# Patient Record
Sex: Female | Born: 1950
Health system: Southern US, Community
[De-identification: ages and names within clinical notes are randomized; demographics above are authoritative.]

## PROBLEM LIST (undated history)

## (undated) DIAGNOSIS — S83207A Unspecified tear of unspecified meniscus, current injury, left knee, initial encounter: Secondary | ICD-10-CM

## (undated) DIAGNOSIS — H43393 Other vitreous opacities, bilateral: Secondary | ICD-10-CM

## (undated) DIAGNOSIS — K449 Diaphragmatic hernia without obstruction or gangrene: Secondary | ICD-10-CM

## (undated) DIAGNOSIS — Z8719 Personal history of other diseases of the digestive system: Secondary | ICD-10-CM

## (undated) DIAGNOSIS — R7303 Prediabetes: Secondary | ICD-10-CM

## (undated) DIAGNOSIS — E559 Vitamin D deficiency, unspecified: Secondary | ICD-10-CM

## (undated) DIAGNOSIS — I10 Essential (primary) hypertension: Secondary | ICD-10-CM

## (undated) DIAGNOSIS — J302 Other seasonal allergic rhinitis: Secondary | ICD-10-CM

## (undated) DIAGNOSIS — K8010 Calculus of gallbladder with chronic cholecystitis without obstruction: Secondary | ICD-10-CM

## (undated) DIAGNOSIS — M1990 Unspecified osteoarthritis, unspecified site: Secondary | ICD-10-CM

## (undated) DIAGNOSIS — R0789 Other chest pain: Secondary | ICD-10-CM

## (undated) DIAGNOSIS — K740 Hepatic fibrosis: Secondary | ICD-10-CM

## (undated) DIAGNOSIS — I499 Cardiac arrhythmia, unspecified: Secondary | ICD-10-CM

## (undated) DIAGNOSIS — R0600 Dyspnea, unspecified: Secondary | ICD-10-CM

## (undated) DIAGNOSIS — Z8619 Personal history of other infectious and parasitic diseases: Secondary | ICD-10-CM

## (undated) DIAGNOSIS — K648 Other hemorrhoids: Secondary | ICD-10-CM

## (undated) DIAGNOSIS — Z8489 Family history of other specified conditions: Secondary | ICD-10-CM

## (undated) DIAGNOSIS — I422 Other hypertrophic cardiomyopathy: Secondary | ICD-10-CM

## (undated) DIAGNOSIS — Z9889 Other specified postprocedural states: Secondary | ICD-10-CM

## (undated) DIAGNOSIS — Z86018 Personal history of other benign neoplasm: Secondary | ICD-10-CM

## (undated) DIAGNOSIS — R0602 Shortness of breath: Secondary | ICD-10-CM

## (undated) HISTORY — DX: Hepatic fibrosis: K74.0

## (undated) HISTORY — PX: DOBUTAMINE STRESS ECHO: SHX5426

## (undated) HISTORY — DX: Other chest pain: R07.89

## (undated) HISTORY — PX: TRANSTHORACIC ECHOCARDIOGRAM: SHX275

## (undated) HISTORY — PX: COLONOSCOPY WITH ESOPHAGOGASTRODUODENOSCOPY (EGD): SHX5779

## (undated) HISTORY — PX: APPENDECTOMY: SHX54

## (undated) HISTORY — DX: Other hemorrhoids: K64.8

## (undated) HISTORY — DX: Essential (primary) hypertension: I10

## (undated) HISTORY — DX: Diaphragmatic hernia without obstruction or gangrene: K44.9

## (undated) HISTORY — DX: Calculus of gallbladder with chronic cholecystitis without obstruction: K80.10

## (undated) HISTORY — DX: Dyspnea, unspecified: R06.00

## (undated) HISTORY — DX: Vitamin D deficiency, unspecified: E55.9

---
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
## 1992-07-18 HISTORY — PX: ABDOMINAL HYSTERECTOMY: SHX81

## 1999-12-16 ENCOUNTER — Encounter: Payer: Self-pay | Admitting: Obstetrics and Gynecology

## 1999-12-16 ENCOUNTER — Encounter: Admission: RE | Admit: 1999-12-16 | Discharge: 1999-12-16 | Payer: Self-pay | Admitting: Obstetrics and Gynecology

## 2000-09-18 ENCOUNTER — Encounter: Admission: RE | Admit: 2000-09-18 | Discharge: 2000-09-28 | Payer: Self-pay | Admitting: Family Medicine

## 2001-01-08 ENCOUNTER — Encounter: Payer: Self-pay | Admitting: Obstetrics and Gynecology

## 2001-01-08 ENCOUNTER — Encounter: Admission: RE | Admit: 2001-01-08 | Discharge: 2001-01-08 | Payer: Self-pay | Admitting: Obstetrics and Gynecology

## 2001-11-20 ENCOUNTER — Encounter: Payer: Self-pay | Admitting: Internal Medicine

## 2001-11-20 ENCOUNTER — Observation Stay (HOSPITAL_COMMUNITY): Admission: RE | Admit: 2001-11-20 | Discharge: 2001-11-21 | Payer: Self-pay | Admitting: Internal Medicine

## 2001-11-21 ENCOUNTER — Encounter: Payer: Self-pay | Admitting: Internal Medicine

## 2002-02-15 ENCOUNTER — Encounter: Payer: Self-pay | Admitting: Obstetrics and Gynecology

## 2002-02-15 ENCOUNTER — Encounter: Admission: RE | Admit: 2002-02-15 | Discharge: 2002-02-15 | Payer: Self-pay | Admitting: Obstetrics and Gynecology

## 2002-02-22 ENCOUNTER — Ambulatory Visit (HOSPITAL_COMMUNITY): Admission: RE | Admit: 2002-02-22 | Discharge: 2002-02-22 | Payer: Self-pay | Admitting: *Deleted

## 2002-02-22 ENCOUNTER — Encounter: Payer: Self-pay | Admitting: Internal Medicine

## 2003-03-19 ENCOUNTER — Encounter: Payer: Self-pay | Admitting: Obstetrics and Gynecology

## 2003-03-19 ENCOUNTER — Encounter: Admission: RE | Admit: 2003-03-19 | Discharge: 2003-03-19 | Payer: Self-pay | Admitting: Obstetrics and Gynecology

## 2004-06-04 ENCOUNTER — Ambulatory Visit (HOSPITAL_COMMUNITY): Admission: RE | Admit: 2004-06-04 | Discharge: 2004-06-04 | Payer: Self-pay | Admitting: Obstetrics and Gynecology

## 2004-07-13 ENCOUNTER — Ambulatory Visit: Payer: Self-pay | Admitting: Internal Medicine

## 2004-08-30 ENCOUNTER — Ambulatory Visit: Payer: Self-pay | Admitting: Internal Medicine

## 2004-08-30 ENCOUNTER — Encounter: Admission: RE | Admit: 2004-08-30 | Discharge: 2004-08-30 | Payer: Self-pay | Admitting: Internal Medicine

## 2004-10-18 ENCOUNTER — Encounter: Admission: RE | Admit: 2004-10-18 | Discharge: 2004-11-04 | Payer: Self-pay | Admitting: Orthopedic Surgery

## 2005-04-25 ENCOUNTER — Ambulatory Visit: Payer: Self-pay | Admitting: Internal Medicine

## 2005-04-29 ENCOUNTER — Ambulatory Visit: Payer: Self-pay | Admitting: Internal Medicine

## 2005-07-12 ENCOUNTER — Ambulatory Visit: Payer: Self-pay | Admitting: Internal Medicine

## 2005-07-15 ENCOUNTER — Ambulatory Visit (HOSPITAL_COMMUNITY): Admission: RE | Admit: 2005-07-15 | Discharge: 2005-07-15 | Payer: Self-pay | Admitting: Obstetrics and Gynecology

## 2005-08-02 ENCOUNTER — Ambulatory Visit: Payer: Self-pay | Admitting: Internal Medicine

## 2005-08-02 LAB — HM COLONOSCOPY

## 2005-08-02 LAB — HM MAMMOGRAPHY

## 2006-02-09 ENCOUNTER — Ambulatory Visit: Payer: Self-pay | Admitting: Internal Medicine

## 2006-02-10 ENCOUNTER — Ambulatory Visit: Payer: Self-pay | Admitting: Cardiology

## 2006-03-09 ENCOUNTER — Ambulatory Visit: Payer: Self-pay | Admitting: Internal Medicine

## 2006-03-17 ENCOUNTER — Ambulatory Visit: Payer: Self-pay

## 2006-03-17 ENCOUNTER — Encounter: Payer: Self-pay | Admitting: Internal Medicine

## 2006-03-17 HISTORY — PX: CARDIOVASCULAR STRESS TEST: SHX262

## 2006-05-09 ENCOUNTER — Ambulatory Visit: Payer: Self-pay | Admitting: Internal Medicine

## 2006-05-09 LAB — CONVERTED CEMR LAB
ALT: 38 units/L (ref 0–40)
AST: 37 units/L (ref 0–37)
BUN: 8 mg/dL (ref 6–23)
Basophils Absolute: 0 10*3/uL (ref 0.0–0.1)
Basophils Relative: 0 % (ref 0.0–1.0)
Creatinine, Ser: 0.8 mg/dL (ref 0.4–1.2)
Eosinophil percent: 2.6 % (ref 0.0–5.0)
HCT: 43.8 % (ref 36.0–46.0)
Hemoglobin: 14.7 g/dL (ref 12.0–15.0)
Lymphocytes Relative: 53.7 % — ABNORMAL HIGH (ref 12.0–46.0)
MCHC: 33.5 g/dL (ref 30.0–36.0)
MCV: 93 fL (ref 78.0–100.0)
Monocytes Absolute: 0.5 10*3/uL (ref 0.2–0.7)
Monocytes Relative: 8.4 % (ref 3.0–11.0)
Neutro Abs: 2 10*3/uL (ref 1.4–7.7)
Neutrophils Relative %: 35.3 % — ABNORMAL LOW (ref 43.0–77.0)
Platelets: 224 10*3/uL (ref 150–400)
Potassium: 3.6 meq/L (ref 3.5–5.1)
RBC: 4.71 M/uL (ref 3.87–5.11)
RDW: 12.5 % (ref 11.5–14.6)
WBC: 5.8 10*3/uL (ref 4.5–10.5)

## 2006-06-05 ENCOUNTER — Ambulatory Visit: Payer: Self-pay | Admitting: Pulmonary Disease

## 2006-06-27 ENCOUNTER — Ambulatory Visit: Payer: Self-pay | Admitting: Pulmonary Disease

## 2006-07-17 ENCOUNTER — Ambulatory Visit (HOSPITAL_COMMUNITY): Admission: RE | Admit: 2006-07-17 | Discharge: 2006-07-17 | Payer: Self-pay | Admitting: Obstetrics and Gynecology

## 2006-07-21 ENCOUNTER — Ambulatory Visit (HOSPITAL_COMMUNITY): Admission: RE | Admit: 2006-07-21 | Discharge: 2006-07-21 | Payer: Self-pay | Admitting: Pulmonary Disease

## 2006-08-03 ENCOUNTER — Ambulatory Visit: Payer: Self-pay | Admitting: Pulmonary Disease

## 2006-08-16 DIAGNOSIS — R946 Abnormal results of thyroid function studies: Secondary | ICD-10-CM

## 2006-12-07 ENCOUNTER — Ambulatory Visit: Payer: Self-pay | Admitting: Internal Medicine

## 2007-01-29 ENCOUNTER — Encounter: Payer: Self-pay | Admitting: Internal Medicine

## 2007-02-15 ENCOUNTER — Encounter: Payer: Self-pay | Admitting: Internal Medicine

## 2007-02-22 ENCOUNTER — Encounter: Payer: Self-pay | Admitting: Internal Medicine

## 2007-03-10 ENCOUNTER — Encounter: Payer: Self-pay | Admitting: Internal Medicine

## 2007-03-13 ENCOUNTER — Ambulatory Visit: Payer: Self-pay | Admitting: Internal Medicine

## 2007-03-13 DIAGNOSIS — R9431 Abnormal electrocardiogram [ECG] [EKG]: Secondary | ICD-10-CM | POA: Insufficient documentation

## 2007-04-05 ENCOUNTER — Ambulatory Visit: Payer: Self-pay | Admitting: Internal Medicine

## 2007-04-05 DIAGNOSIS — I10 Essential (primary) hypertension: Secondary | ICD-10-CM

## 2007-04-05 HISTORY — DX: Essential (primary) hypertension: I10

## 2007-05-21 ENCOUNTER — Encounter: Payer: Self-pay | Admitting: Internal Medicine

## 2007-08-16 ENCOUNTER — Ambulatory Visit (HOSPITAL_COMMUNITY): Admission: RE | Admit: 2007-08-16 | Discharge: 2007-08-16 | Payer: Self-pay | Admitting: Obstetrics and Gynecology

## 2007-11-06 ENCOUNTER — Ambulatory Visit: Payer: Self-pay | Admitting: Internal Medicine

## 2007-11-06 DIAGNOSIS — R091 Pleurisy: Secondary | ICD-10-CM | POA: Insufficient documentation

## 2007-11-07 ENCOUNTER — Ambulatory Visit: Payer: Self-pay | Admitting: Internal Medicine

## 2007-11-07 ENCOUNTER — Telehealth: Payer: Self-pay | Admitting: Family Medicine

## 2007-11-07 ENCOUNTER — Telehealth (INDEPENDENT_AMBULATORY_CARE_PROVIDER_SITE_OTHER): Payer: Self-pay | Admitting: *Deleted

## 2007-11-08 ENCOUNTER — Telehealth (INDEPENDENT_AMBULATORY_CARE_PROVIDER_SITE_OTHER): Payer: Self-pay | Admitting: *Deleted

## 2007-11-20 ENCOUNTER — Ambulatory Visit: Payer: Self-pay | Admitting: Internal Medicine

## 2007-11-22 ENCOUNTER — Telehealth (INDEPENDENT_AMBULATORY_CARE_PROVIDER_SITE_OTHER): Payer: Self-pay | Admitting: *Deleted

## 2007-12-03 ENCOUNTER — Telehealth (INDEPENDENT_AMBULATORY_CARE_PROVIDER_SITE_OTHER): Payer: Self-pay | Admitting: *Deleted

## 2007-12-04 ENCOUNTER — Ambulatory Visit: Payer: Self-pay | Admitting: Internal Medicine

## 2007-12-04 DIAGNOSIS — R079 Chest pain, unspecified: Secondary | ICD-10-CM

## 2007-12-07 ENCOUNTER — Telehealth: Payer: Self-pay | Admitting: Internal Medicine

## 2007-12-12 ENCOUNTER — Telehealth: Payer: Self-pay | Admitting: Internal Medicine

## 2007-12-12 ENCOUNTER — Encounter: Payer: Self-pay | Admitting: Internal Medicine

## 2007-12-13 ENCOUNTER — Ambulatory Visit: Payer: Self-pay | Admitting: Internal Medicine

## 2007-12-13 DIAGNOSIS — R05 Cough: Secondary | ICD-10-CM

## 2007-12-13 DIAGNOSIS — R071 Chest pain on breathing: Secondary | ICD-10-CM | POA: Insufficient documentation

## 2007-12-18 ENCOUNTER — Ambulatory Visit: Payer: Self-pay | Admitting: Cardiology

## 2007-12-18 ENCOUNTER — Telehealth: Payer: Self-pay | Admitting: Internal Medicine

## 2008-01-21 ENCOUNTER — Telehealth (INDEPENDENT_AMBULATORY_CARE_PROVIDER_SITE_OTHER): Payer: Self-pay | Admitting: *Deleted

## 2008-01-28 ENCOUNTER — Ambulatory Visit: Payer: Self-pay | Admitting: Internal Medicine

## 2008-01-28 DIAGNOSIS — J9819 Other pulmonary collapse: Secondary | ICD-10-CM | POA: Insufficient documentation

## 2008-04-26 ENCOUNTER — Encounter: Payer: Self-pay | Admitting: Internal Medicine

## 2008-05-08 ENCOUNTER — Encounter: Payer: Self-pay | Admitting: Internal Medicine

## 2008-05-12 ENCOUNTER — Ambulatory Visit: Payer: Self-pay | Admitting: Internal Medicine

## 2008-05-12 DIAGNOSIS — R1013 Epigastric pain: Secondary | ICD-10-CM

## 2008-05-12 LAB — CONVERTED CEMR LAB
Bilirubin Urine: NEGATIVE
Blood in Urine, dipstick: NEGATIVE
Glucose, Urine, Semiquant: NEGATIVE
Ketones, urine, test strip: NEGATIVE
Nitrite: NEGATIVE
Protein, U semiquant: NEGATIVE
Specific Gravity, Urine: <1.005
Urobilinogen, UA: 0.2
WBC Urine, dipstick: NEGATIVE
pH: 8

## 2008-05-13 ENCOUNTER — Encounter: Payer: Self-pay | Admitting: Internal Medicine

## 2008-05-13 DIAGNOSIS — R748 Abnormal levels of other serum enzymes: Secondary | ICD-10-CM | POA: Insufficient documentation

## 2008-05-14 ENCOUNTER — Encounter (INDEPENDENT_AMBULATORY_CARE_PROVIDER_SITE_OTHER): Payer: Self-pay | Admitting: *Deleted

## 2008-05-14 LAB — CONVERTED CEMR LAB
ALT: 31 units/L (ref 0–35)
AST: 30 units/L (ref 0–37)
Amylase: 143 units/L — ABNORMAL HIGH (ref 27–131)
HCT: 42.1 % (ref 36.0–46.0)
Hemoglobin: 14.4 g/dL (ref 12.0–15.0)
Lipase: 31 units/L (ref 11.0–59.0)
MCHC: 34.3 g/dL (ref 30.0–36.0)
MCV: 93.9 fL (ref 78.0–100.0)
Platelets: 172 10*3/uL (ref 150–400)
RBC: 4.48 M/uL (ref 3.87–5.11)
RDW: 12.8 % (ref 11.5–14.6)
WBC: 7.3 10*3/uL (ref 4.5–10.5)

## 2008-05-21 ENCOUNTER — Encounter: Admission: RE | Admit: 2008-05-21 | Discharge: 2008-05-21 | Payer: Self-pay | Admitting: Internal Medicine

## 2008-05-22 ENCOUNTER — Telehealth (INDEPENDENT_AMBULATORY_CARE_PROVIDER_SITE_OTHER): Payer: Self-pay | Admitting: *Deleted

## 2008-06-26 ENCOUNTER — Telehealth (INDEPENDENT_AMBULATORY_CARE_PROVIDER_SITE_OTHER): Payer: Self-pay | Admitting: *Deleted

## 2008-06-30 ENCOUNTER — Encounter (INDEPENDENT_AMBULATORY_CARE_PROVIDER_SITE_OTHER): Payer: Self-pay | Admitting: *Deleted

## 2008-07-03 ENCOUNTER — Telehealth (INDEPENDENT_AMBULATORY_CARE_PROVIDER_SITE_OTHER): Payer: Self-pay | Admitting: *Deleted

## 2008-07-15 ENCOUNTER — Ambulatory Visit: Payer: Self-pay | Admitting: Cardiovascular Disease

## 2008-07-29 DIAGNOSIS — I2510 Atherosclerotic heart disease of native coronary artery without angina pectoris: Secondary | ICD-10-CM | POA: Insufficient documentation

## 2008-07-29 DIAGNOSIS — K649 Unspecified hemorrhoids: Secondary | ICD-10-CM | POA: Insufficient documentation

## 2008-07-30 ENCOUNTER — Ambulatory Visit: Payer: Self-pay | Admitting: Internal Medicine

## 2008-07-30 DIAGNOSIS — R932 Abnormal findings on diagnostic imaging of liver and biliary tract: Secondary | ICD-10-CM | POA: Insufficient documentation

## 2008-08-04 ENCOUNTER — Ambulatory Visit: Payer: Self-pay | Admitting: Internal Medicine

## 2008-08-05 DIAGNOSIS — K8020 Calculus of gallbladder without cholecystitis without obstruction: Secondary | ICD-10-CM | POA: Insufficient documentation

## 2008-08-05 DIAGNOSIS — R143 Flatulence: Secondary | ICD-10-CM

## 2008-08-05 DIAGNOSIS — R141 Gas pain: Secondary | ICD-10-CM

## 2008-08-05 DIAGNOSIS — R142 Eructation: Secondary | ICD-10-CM

## 2008-09-25 ENCOUNTER — Encounter (INDEPENDENT_AMBULATORY_CARE_PROVIDER_SITE_OTHER): Payer: Self-pay | Admitting: *Deleted

## 2008-09-25 ENCOUNTER — Ambulatory Visit: Payer: Self-pay | Admitting: Internal Medicine

## 2008-09-25 DIAGNOSIS — J309 Allergic rhinitis, unspecified: Secondary | ICD-10-CM | POA: Insufficient documentation

## 2008-09-26 ENCOUNTER — Telehealth (INDEPENDENT_AMBULATORY_CARE_PROVIDER_SITE_OTHER): Payer: Self-pay | Admitting: *Deleted

## 2008-09-29 ENCOUNTER — Ambulatory Visit: Payer: Self-pay | Admitting: Internal Medicine

## 2008-09-29 DIAGNOSIS — K219 Gastro-esophageal reflux disease without esophagitis: Secondary | ICD-10-CM | POA: Insufficient documentation

## 2008-10-16 ENCOUNTER — Ambulatory Visit (HOSPITAL_COMMUNITY): Admission: RE | Admit: 2008-10-16 | Discharge: 2008-10-16 | Payer: Self-pay | Admitting: Internal Medicine

## 2008-10-23 ENCOUNTER — Telehealth (INDEPENDENT_AMBULATORY_CARE_PROVIDER_SITE_OTHER): Payer: Self-pay | Admitting: *Deleted

## 2008-11-14 ENCOUNTER — Ambulatory Visit (HOSPITAL_COMMUNITY): Admission: RE | Admit: 2008-11-14 | Discharge: 2008-11-14 | Payer: Self-pay | Admitting: Obstetrics and Gynecology

## 2008-12-01 ENCOUNTER — Encounter: Payer: Self-pay | Admitting: Internal Medicine

## 2008-12-08 ENCOUNTER — Ambulatory Visit: Payer: Self-pay | Admitting: Internal Medicine

## 2008-12-08 LAB — CONVERTED CEMR LAB
BUN: 7 mg/dL (ref 6–23)
Creatinine, Ser: 0.7 mg/dL (ref 0.4–1.2)

## 2008-12-12 ENCOUNTER — Ambulatory Visit: Payer: Self-pay | Admitting: Internal Medicine

## 2008-12-29 ENCOUNTER — Ambulatory Visit: Payer: Self-pay | Admitting: Family Medicine

## 2008-12-29 DIAGNOSIS — R21 Rash and other nonspecific skin eruption: Secondary | ICD-10-CM

## 2009-01-01 ENCOUNTER — Telehealth (INDEPENDENT_AMBULATORY_CARE_PROVIDER_SITE_OTHER): Payer: Self-pay | Admitting: *Deleted

## 2009-02-26 ENCOUNTER — Ambulatory Visit: Payer: Self-pay | Admitting: Internal Medicine

## 2009-02-26 DIAGNOSIS — M25519 Pain in unspecified shoulder: Secondary | ICD-10-CM

## 2009-02-26 DIAGNOSIS — K921 Melena: Secondary | ICD-10-CM | POA: Insufficient documentation

## 2009-04-27 ENCOUNTER — Telehealth: Payer: Self-pay | Admitting: Internal Medicine

## 2009-05-04 ENCOUNTER — Encounter: Payer: Self-pay | Admitting: Internal Medicine

## 2009-05-11 ENCOUNTER — Telehealth: Payer: Self-pay | Admitting: Internal Medicine

## 2009-05-12 ENCOUNTER — Encounter (INDEPENDENT_AMBULATORY_CARE_PROVIDER_SITE_OTHER): Payer: Self-pay | Admitting: *Deleted

## 2009-06-15 ENCOUNTER — Ambulatory Visit: Payer: Self-pay | Admitting: Internal Medicine

## 2009-06-25 ENCOUNTER — Telehealth: Payer: Self-pay | Admitting: Internal Medicine

## 2009-08-06 ENCOUNTER — Telehealth: Payer: Self-pay | Admitting: Internal Medicine

## 2009-08-25 ENCOUNTER — Ambulatory Visit: Payer: Self-pay | Admitting: Internal Medicine

## 2009-08-25 DIAGNOSIS — R062 Wheezing: Secondary | ICD-10-CM

## 2009-09-22 ENCOUNTER — Ambulatory Visit: Payer: Self-pay | Admitting: Diagnostic Radiology

## 2009-09-22 ENCOUNTER — Ambulatory Visit (HOSPITAL_BASED_OUTPATIENT_CLINIC_OR_DEPARTMENT_OTHER): Admission: RE | Admit: 2009-09-22 | Discharge: 2009-09-22 | Payer: Self-pay | Admitting: Family Medicine

## 2009-09-22 ENCOUNTER — Ambulatory Visit: Payer: Self-pay | Admitting: Family Medicine

## 2009-09-22 DIAGNOSIS — Z87448 Personal history of other diseases of urinary system: Secondary | ICD-10-CM

## 2009-09-28 ENCOUNTER — Encounter: Payer: Self-pay | Admitting: Internal Medicine

## 2009-11-25 ENCOUNTER — Ambulatory Visit: Payer: Self-pay | Admitting: Family Medicine

## 2009-11-25 ENCOUNTER — Telehealth: Payer: Self-pay | Admitting: Family Medicine

## 2009-11-25 ENCOUNTER — Encounter: Payer: Self-pay | Admitting: Internal Medicine

## 2009-11-25 DIAGNOSIS — R0602 Shortness of breath: Secondary | ICD-10-CM

## 2009-11-26 ENCOUNTER — Encounter: Payer: Self-pay | Admitting: Family Medicine

## 2009-12-02 ENCOUNTER — Telehealth: Payer: Self-pay | Admitting: Family Medicine

## 2009-12-07 ENCOUNTER — Encounter: Payer: Self-pay | Admitting: Family Medicine

## 2009-12-10 ENCOUNTER — Encounter (HOSPITAL_COMMUNITY): Admission: RE | Admit: 2009-12-10 | Discharge: 2010-03-10 | Payer: Self-pay | Admitting: Family Medicine

## 2010-01-22 ENCOUNTER — Ambulatory Visit (HOSPITAL_COMMUNITY): Admission: RE | Admit: 2010-01-22 | Discharge: 2010-01-22 | Payer: Self-pay | Admitting: Obstetrics and Gynecology

## 2010-02-05 ENCOUNTER — Encounter: Payer: Self-pay | Admitting: Internal Medicine

## 2010-02-05 ENCOUNTER — Ambulatory Visit: Payer: Self-pay | Admitting: Internal Medicine

## 2010-02-05 DIAGNOSIS — K222 Esophageal obstruction: Secondary | ICD-10-CM

## 2010-02-05 DIAGNOSIS — Z8719 Personal history of other diseases of the digestive system: Secondary | ICD-10-CM | POA: Insufficient documentation

## 2010-02-05 DIAGNOSIS — R1012 Left upper quadrant pain: Secondary | ICD-10-CM | POA: Insufficient documentation

## 2010-02-08 ENCOUNTER — Telehealth (INDEPENDENT_AMBULATORY_CARE_PROVIDER_SITE_OTHER): Payer: Self-pay | Admitting: *Deleted

## 2010-02-08 ENCOUNTER — Encounter (INDEPENDENT_AMBULATORY_CARE_PROVIDER_SITE_OTHER): Payer: Self-pay | Admitting: *Deleted

## 2010-02-09 LAB — CONVERTED CEMR LAB
AST: 41 units/L — ABNORMAL HIGH (ref 0–37)
Albumin: 4 g/dL (ref 3.5–5.2)
Alkaline Phosphatase: 48 units/L (ref 39–117)
Amylase: 106 units/L (ref 27–131)
Basophils Absolute: 0 10*3/uL (ref 0.0–0.1)
Basophils Relative: 0.8 % (ref 0.0–3.0)
Bilirubin, Direct: 0.2 mg/dL (ref 0.0–0.3)
Eosinophils Absolute: 0.2 10*3/uL (ref 0.0–0.7)
Eosinophils Relative: 3.4 % (ref 0.0–5.0)
HCT: 42.7 % (ref 36.0–46.0)
Hemoglobin: 14.6 g/dL (ref 12.0–15.0)
Lipase: 37 units/L (ref 11.0–59.0)
Lymphocytes Relative: 42.4 % (ref 12.0–46.0)
Lymphs Abs: 2.6 10*3/uL (ref 0.7–4.0)
MCHC: 34.3 g/dL (ref 30.0–36.0)
MCV: 92.6 fL (ref 78.0–100.0)
Monocytes Absolute: 0.4 10*3/uL (ref 0.1–1.0)
Monocytes Relative: 6.6 % (ref 3.0–12.0)
Neutro Abs: 2.8 10*3/uL (ref 1.4–7.7)
Neutrophils Relative %: 46.8 % (ref 43.0–77.0)
Platelets: 200 10*3/uL (ref 150.0–400.0)
RBC: 4.61 M/uL (ref 3.87–5.11)
RDW: 13.3 % (ref 11.5–14.6)
Total Protein: 6.9 g/dL (ref 6.0–8.3)

## 2010-02-10 ENCOUNTER — Encounter: Payer: Self-pay | Admitting: Cardiology

## 2010-02-10 ENCOUNTER — Ambulatory Visit: Payer: Self-pay | Admitting: Cardiology

## 2010-03-05 ENCOUNTER — Ambulatory Visit: Payer: Self-pay

## 2010-03-05 ENCOUNTER — Encounter: Payer: Self-pay | Admitting: Cardiology

## 2010-03-05 ENCOUNTER — Ambulatory Visit: Payer: Self-pay | Admitting: Cardiology

## 2010-03-05 ENCOUNTER — Ambulatory Visit (HOSPITAL_COMMUNITY): Admission: RE | Admit: 2010-03-05 | Discharge: 2010-03-05 | Payer: Self-pay | Admitting: Cardiology

## 2010-03-09 ENCOUNTER — Telehealth: Payer: Self-pay | Admitting: Internal Medicine

## 2010-03-10 ENCOUNTER — Telehealth: Payer: Self-pay | Admitting: Internal Medicine

## 2010-03-19 ENCOUNTER — Ambulatory Visit: Payer: Self-pay | Admitting: Internal Medicine

## 2010-03-26 LAB — CONVERTED CEMR LAB: AST: 43 units/L — ABNORMAL HIGH (ref 0–37)

## 2010-04-16 ENCOUNTER — Ambulatory Visit: Payer: Self-pay | Admitting: Internal Medicine

## 2010-04-16 DIAGNOSIS — R740 Nonspecific elevation of levels of transaminase and lactic acid dehydrogenase [LDH]: Secondary | ICD-10-CM

## 2010-04-16 DIAGNOSIS — K140 Glossitis: Secondary | ICD-10-CM

## 2010-07-13 ENCOUNTER — Ambulatory Visit: Payer: Self-pay | Admitting: Internal Medicine

## 2010-07-13 DIAGNOSIS — R1011 Right upper quadrant pain: Secondary | ICD-10-CM | POA: Insufficient documentation

## 2010-07-13 DIAGNOSIS — K449 Diaphragmatic hernia without obstruction or gangrene: Secondary | ICD-10-CM | POA: Insufficient documentation

## 2010-08-06 ENCOUNTER — Other Ambulatory Visit: Payer: Self-pay | Admitting: Internal Medicine

## 2010-08-06 ENCOUNTER — Ambulatory Visit
Admission: RE | Admit: 2010-08-06 | Discharge: 2010-08-06 | Payer: Self-pay | Source: Home / Self Care | Attending: Internal Medicine | Admitting: Internal Medicine

## 2010-08-06 LAB — ALT: ALT: 33 U/L (ref 0–35)

## 2010-08-06 LAB — AST: AST: 32 U/L (ref 0–37)

## 2010-08-08 ENCOUNTER — Encounter: Payer: Self-pay | Admitting: Internal Medicine

## 2010-08-10 ENCOUNTER — Ambulatory Visit
Admission: RE | Admit: 2010-08-10 | Discharge: 2010-08-10 | Payer: Self-pay | Source: Home / Self Care | Attending: Internal Medicine | Admitting: Internal Medicine

## 2010-08-10 ENCOUNTER — Encounter: Payer: Self-pay | Admitting: Internal Medicine

## 2010-08-15 LAB — CONVERTED CEMR LAB
BUN: 6 mg/dL (ref 6–23)
Basophils Absolute: 0 10*3/uL (ref 0.0–0.1)
Basophils Absolute: 0 10*3/uL (ref 0.0–0.1)
Basophils Relative: 0.7 % (ref 0.0–1.0)
Basophils Relative: 0.9 % (ref 0.0–1.0)
CO2: 30 meq/L (ref 19–32)
Calcium: 9.1 mg/dL (ref 8.4–10.5)
Chloride: 103 meq/L (ref 96–112)
Creatinine, Ser: 0.8 mg/dL (ref 0.4–1.2)
Eosinophils Absolute: 0 10*3/uL (ref 0.0–0.7)
Eosinophils Absolute: 0.2 10*3/uL (ref 0.0–0.7)
Eosinophils Relative: 0 % (ref 0.0–5.0)
Eosinophils Relative: 4 % (ref 0.0–5.0)
GFR calc Af Amer: 96 mL/min
GFR calc non Af Amer: 79 mL/min
Glucose, Bld: 86 mg/dL (ref 70–99)
HCT: 43.6 % (ref 36.0–46.0)
HCT: 44.2 % (ref 36.0–46.0)
Hemoglobin: 15 g/dL (ref 12.0–15.0)
Hemoglobin: 15.1 g/dL — ABNORMAL HIGH (ref 12.0–15.0)
Lymphocytes Relative: 45.9 % (ref 12.0–46.0)
Lymphocytes Relative: 87.2 % — ABNORMAL HIGH (ref 12.0–46.0)
MCHC: 34.2 g/dL (ref 30.0–36.0)
MCHC: 34.3 g/dL (ref 30.0–36.0)
MCV: 91.4 fL (ref 78.0–100.0)
MCV: 91.6 fL (ref 78.0–100.0)
Monocytes Absolute: 0.3 10*3/uL (ref 0.1–1.0)
Monocytes Absolute: 0.4 10*3/uL (ref 0.1–1.0)
Monocytes Relative: 11.1 % (ref 3.0–12.0)
Monocytes Relative: 7.5 % (ref 3.0–12.0)
Neutro Abs: 0 10*3/uL — ABNORMAL LOW (ref 1.4–7.7)
Neutro Abs: 2 10*3/uL (ref 1.4–7.7)
Neutrophils Relative %: 1 % — ABNORMAL LOW (ref 43.0–77.0)
Neutrophils Relative %: 41.7 % — ABNORMAL LOW (ref 43.0–77.0)
Platelets: 229 10*3/uL (ref 150–400)
Platelets: 253 10*3/uL (ref 150–400)
Potassium: 3.6 meq/L (ref 3.5–5.1)
RBC: 4.76 M/uL (ref 3.87–5.11)
RBC: 4.84 M/uL (ref 3.87–5.11)
RDW: 12.4 % (ref 11.5–14.6)
RDW: 12.8 % (ref 11.5–14.6)
Sodium: 139 meq/L (ref 135–145)
WBC: 3 10*3/uL — ABNORMAL LOW (ref 4.5–10.5)
WBC: 4.8 10*3/uL (ref 4.5–10.5)

## 2010-08-19 NOTE — Assessment & Plan Note (Signed)
Summary: [facility]      Allergies Added: NKDA  Visit Type:  Initial Consult Referring Provider:  n/a Primary Provider:  Jettie Booze, MD   CC:  chest pain.  History of Present Illness: 60 year old female for evaluation of chest pain. Patient has apparently had occasional chest pain for years. It is in the substernal/epigastric area and does not radiate. It increases with exertion as well as anxiety. It resolved spontaneously after minutes. There is no nausea or diaphoresis but there is shortness of breath. The pain is not pleuritic or positional. She also recently was noted to have significant T-wave inversions on her electrocardiogram. Because of her chest pain and abnormal ECG we were asked to further evaluate. Note she did have a functional study in 2007 that was normal.  Current Medications (verified): 1)  Prilosec Otc 20 Mg Tbec (Omeprazole Magnesium) .... Take 1 By Mouth Once Daily 2)  Align  Caps (Probiotic Product) .Marland Kitchen.. 1 Capsule By Mouth Once Daily 3)  Celebrex 200 Mg Caps (Celecoxib) .Marland Kitchen.. 1 By Mouth Once Daily 4)  Hyoscyamine Sulfate 0.125 Mg Subl (Hyoscyamine Sulfate) .Marland Kitchen.. 1 Sl Every 4 -6 Hrs Prn  Allergies (verified): No Known Drug Allergies  Past History:  Past Medical History: CAD  HYPERTENSION, ESSENTIAL NOS  CHOLELITHIASIS, HX OF  ESOPHAGEAL STRICTURE HEMATURIA, MICROSCOPIC, HX OF  GERD  RHINITIS  CHOLELITHIASIS HEMORRHOIDS Abnormal TFTs 05/2006 - dyspnea;evaluated by Dr. Shelle Iron. Had Normal PFTs with mild reduction in DLCO.  VQ scan  was normal. Placed on expectant followup (ie to be seen prn) No recurrence since then  01/2006 - CT angiogram - negative for PE  Past Surgical History: Reviewed history from 05/12/2008 and no changes required. G2P1 Appendectomy Colonoscopy neg 2006 Hysterectomy 1994 : fibroids, dysfunctional menses  Family History: Reviewed history from 07/30/2008 and no changes required. Mother-living, HTN,  Parkinson'ds Father-deceased prostate CA (diagnosed after age 64) 3 sisters HTN 1 brother HTN, prostate CA (diagnosed about 60 yrs old) Family History of Prostate CA 1st degree relative <50; father  Family History of Arthritis-Mother (severe)  Family History Hypertension: mgm, brothers and sisters No FH GI disease  Social History: Reviewed history from 07/30/2008 and no changes required. 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   Review of Systems       no fevers or chills, productive cough, hemoptysis, dysphasia, odynophagia, melena, hematochezia, dysuria, hematuria, rash, seizure activity, orthopnea, PND, pedal edema, claudication. Remaining systems are negative.   Vital Signs:  Patient profile:   60 year old female Height:      61 inches Weight:      153 pounds BMI:     29.01 Pulse rate:   62 / minute Pulse rhythm:   regular Resp:     18 per minute BP sitting:   140 / 100  (left arm) Cuff size:   large  Vitals Entered By: Vikki Ports (February 10, 2010 10:58 AM)  Physical Exam  General:  Well developed/well nourished in NAD Skin warm/dry Patient not depressed No peripheral clubbing Back-normal HEENT-normal/normal eyelids Neck supple/normal carotid upstroke bilaterally; no bruits; no JVD; no thyromegaly chest - CTA/ normal expansion CV - RRR/normal S1 and S2; no murmurs, rubs or gallops;  PMI nondisplaced Abdomen -NT/ND, no HSM, no mass, + bowel sounds, no bruit 2+ femoral pulses, no bruits Ext-no edema, chords, 2+ DP Neuro-grossly nonfocal     EKG  Procedure date:  02/10/2010  Findings:      Normal sinus rhythm at a rate of 62. Inferior and anterolateral T-wave inversion.  Impression & Recommendations:  Problem # 1:  CHEST PAIN (ICD-786.50) Symptoms with both typical and atypical features. Electrocardiogram abnormal. Proceed with stress  echocardiogram. The following medications were removed from the medication list:    Amlodipine Besylate 5 Mg Tabs (Amlodipine besylate) .Marland Kitchen... 1 qd  Orders: EKG w/ Interpretation (93000) Stress Echo (Stress Echo)  The following medications were removed from the medication list:    Amlodipine Besylate 5 Mg Tabs (Amlodipine besylate) .Marland Kitchen... 1 qd  Problem # 2:  HYPERTENSION, ESSENTIAL NOS (ICD-401.9) Blood pressure elevated. I've asked her to resume her blood pressure medications and she will followup with Dr. Alwyn Ren for this issue. The following medications were removed from the medication list:    Amlodipine Besylate 5 Mg Tabs (Amlodipine besylate) .Marland Kitchen... 1 qd    Avapro 150 Mg Tabs (Irbesartan) .Marland Kitchen... 1 qd  Problem # 3:  ELECTROCARDIOGRAM, ABNORMAL (ICD-794.31)  As per above plan stress echocardiogram. The following medications were removed from the medication list:    Amlodipine Besylate 5 Mg Tabs (Amlodipine besylate) .Marland Kitchen... 1 qd  The following medications were removed from the medication list:    Amlodipine Besylate 5 Mg Tabs (Amlodipine besylate) .Marland Kitchen... 1 qd  Problem # 4:  GERD (ICD-530.81)  Her updated medication list for this problem includes:    Prilosec Otc 20 Mg Tbec (Omeprazole magnesium) .Marland Kitchen... Take 1 by mouth once daily    Hyoscyamine Sulfate 0.125 Mg Subl (Hyoscyamine sulfate) .Marland Kitchen... 1 sl every 4 -6 hrs prn  Her updated medication list for this problem includes:    Prilosec Otc 20 Mg Tbec (Omeprazole magnesium) .Marland Kitchen... Take 1 by mouth once daily    Hyoscyamine Sulfate 0.125 Mg Subl (Hyoscyamine sulfate) .Marland Kitchen... 1 sl every 4 -6 hrs prn  Patient Instructions: 1)  Your physician recommends that you schedule a follow-up appointment in: AS NEEDED 2)  Your physician recommends that you continue on your current medications as directed. Please refer to the Current Medication list given to you today. 3)  Your physician has requested that you have a stress echocardiogram. For further  information please visit [email or web address]  Please follow instruction sheet as given.

## 2010-08-19 NOTE — Assessment & Plan Note (Signed)
Summary: still having pain in side//kn   Vital Signs:  Patient profile:   60 year old female Weight:      154.6 pounds O2 Sat:      99 % Temp:     98.5 degrees F oral Pulse rate:   69 / minute Resp:     17 per minute BP sitting:   128 / 86  (left arm) Cuff size:   large  Vitals Entered By: Shonna Chock CMA (February 05, 2010 10:48 AM) CC: 1.) Ongoing left side pain: pain is worse and constant  2.) Ongoing breathing concerns, Abdominal pain   Primary Care Provider:  Jettie Booze, MD   CC:  1.) Ongoing left side pain: pain is worse and constant  2.) Ongoing breathing concerns and Abdominal pain.  History of Present Illness:  Abdominal Pain      This is a 60 year old woman who presents with Abdominal pain for 6 months, progressive  & now essentially constant .  The patient reports nausea, but denies vomiting, diarrhea, constipation, melena, hematochezia, anorexia, and hematemesis.  The location of the pain is left upper quadrant below the ribs.  The pain is described as constant andthrobbing  in quality.  Associated symptoms include occasional  chest pain.  The patient denies the following symptoms: fever, weight loss, dysuria, jaundice, dark urine, and vaginal bleeding.  The pain has no triggers or relievers.        The patient reports resting chest pain, nausea, and shortness of breath, but denies palpitations, dizziness, light headedness, and syncope.  The pain is described as intermittent and pressure-like.  The pain is located in the substernal area and the pain does not radiate.  Episodes of chest pain last >30 minutes.  The pain is brought on or made worse by meals.  The pain is relieved or improved with  Ginger Ale.  Last episode of pain several days ago. Old chart reviewed : abnormal EKG (diffuse T inversion, stable); gallstones ; esophageal stricture 07/2008.  Current Medications (verified): 1)  Amlodipine Besylate 5 Mg  Tabs (Amlodipine Besylate) .Marland Kitchen.. 1 Qd 2)  Avapro 150 Mg  Tabs  (Irbesartan) .Marland Kitchen.. 1 Qd 3)  Prilosec Otc 20 Mg Tbec (Omeprazole Magnesium) .... Take 1 By Mouth Once Daily 4)  Align  Caps (Probiotic Product) .Marland Kitchen.. 1 Capsule By Mouth Once Daily 5)  Celebrex 200 Mg Caps (Celecoxib) .Marland Kitchen.. 1 By Mouth Once Daily  Allergies (verified): No Known Drug Allergies  Review of Systems General:  Denies chills; Sweats ? menopausal. ENT:  Denies difficulty swallowing and hoarseness. GU:  Denies discharge and hematuria.  Physical Exam  General:  well-nourished,in no acute distress; alert,appropriate and cooperative throughout examination Eyes:  No corneal or conjunctival inflammation noted.Perrla. No icterus Mouth:  Oral mucosa and oropharynx without lesions or exudates.  No pharyngeal erythema.   Lungs:  Normal respiratory effort, chest expands symmetrically. Lungs are clear to auscultation, no crackles or wheezes. Heart:  normal rate, regular rhythm, no gallop, no rub, no JVD, no HJR, and grade  1/2 -1 /6 systolic murmur LSB.   Abdomen:  Bowel sounds positive,abdomen soft and non-tender without masses, organomegaly or hernias noted. Aorta palpable w/o AAA Pulses:  R and L carotid,radial,dorsalis pedis and posterior tibial pulses are full and equal bilaterally Extremities:  No clubbing, cyanosis, edema. Neg Homan's Neurologic:  alert & oriented X3.   Skin:  Intact without suspicious lesions or rashes Cervical Nodes:  No lymphadenopathy noted Axillary Nodes:  No  palpable lymphadenopathy Psych:  memory intact for recent and remote and subdued.     Impression & Recommendations:  Problem # 1:  ABDOMINAL PAIN, LEFT UPPER QUADRANT (ICD-789.02)  Orders: Venipuncture (16109) TLB-CBC Platelet - w/Differential (85025-CBCD) TLB-Hepatic/Liver Function Pnl (80076-HEPATIC) TLB-Amylase (82150-AMYL) TLB-Lipase (83690-LIPASE)  Problem # 2:  CHEST PAIN (ICD-786.50)  GI etiology suggested  Orders: Misc. Referral (Misc. Ref)  Problem # 3:  ELECTROCARDIOGRAM, ABNORMAL  (ICD-794.31)  Diffuse T inversion  Orders: Misc. Referral (Misc. Ref)  Problem # 4:  ESOPHAGEAL STRICTURE (ICD-530.3) PMH of  Problem # 5:  CHOLELITHIASIS, HX OF (ICD-V12.79)  Complete Medication List: 1)  Amlodipine Besylate 5 Mg Tabs (Amlodipine besylate) .Marland Kitchen.. 1 qd 2)  Avapro 150 Mg Tabs (Irbesartan) .Marland Kitchen.. 1 qd 3)  Prilosec Otc 20 Mg Tbec (Omeprazole magnesium) .... Take 1 by mouth once daily 4)  Align Caps (Probiotic product) .Marland Kitchen.. 1 capsule by mouth once daily 5)  Celebrex 200 Mg Caps (Celecoxib) .Marland Kitchen.. 1 by mouth once daily 6)  Hyoscyamine Sulfate 0.125 Mg Subl (Hyoscyamine sulfate) .Marland Kitchen.. 1 sl every 4 -6 hrs prn  Patient Instructions: 1)  Complete stool cards. If Cardiac workup is negative , GI re-evaluation . Cholecystectomy would be ultimate intervention if all ekse negative. Prescriptions: HYOSCYAMINE SULFATE 0.125 MG SUBL (HYOSCYAMINE SULFATE) 1 SL every 4 -6 hrs prn  #30 x 1   Entered and Authorized by:   Marga Melnick MD   Signed by:   Marga Melnick MD on 02/05/2010   Method used:   Print then Give to Patient   RxID:   [id number]

## 2010-08-19 NOTE — Progress Notes (Signed)
Summary: PRIOR AUTH-AVAPRO- (Med Changed)  Phone Note Refill Request Call back at [phone] Message from:  Pharmacy on March 10, 2010 8:14 AM  Refills Requested: Medication #1:  AVAPRO 150 MG TABS Take once dailyMonitor BP ; goal = average < 135/85.   Dosage confirmed as above?Dosage Confirmed   Supply Requested: 1 month PRIOR AUTH NEEDED: [id number]   Next Appointment Scheduled: 03/19/10 Initial call taken by: Lavell Islam,  March 10, 2010 8:15 AM  Follow-up for Phone Call        I called and requested prior auth paperwork   Follow-up by: Shonna Chock CMA,  March 10, 2010 3:52 PM  Additional Follow-up for Phone Call Additional follow up Details #1::        I received paperwork and it indicated that patient must try and fail at least one of the meds listed below: Losartan Diovan Micardis I reviewed removed meds on patient's med list and did NOT see either of the meds. I called patient and left message on her voicemail informing her of this process and I will forward to Dr.Hopper and have him choose an alternative and forward to the pharmacy. Patient to call if any questions or concerns./Chrae [facility] CMA  March 10, 2010 4:47 PM     Additional Follow-up for Phone Call Additional follow up Details #2::    Losartan 100 mg #90; goal = BP average < 135/85 Follow-up by: Marga Melnick MD,  March 11, 2010 10:06 AM  Additional Follow-up for Phone Call Additional follow up Details #3:: Details for Additional Follow-up Action Taken: Patient aware med changed, rx to be sent to the pharmacy./Chrae [facility] CMA  March 11, 2010 10:10 AM   New/Updated Medications: LOSARTAN POTASSIUM 100 MG TABS (LOSARTAN POTASSIUM) 1 by mouth once daily  goal = BP average < 135/85 Prescriptions: LOSARTAN POTASSIUM 100 MG TABS (LOSARTAN POTASSIUM) 1 by mouth once daily  goal = BP average < 135/85  #90 x 1   Entered by:   Shonna Chock CMA   Authorized by:   Marga Melnick MD   Signed by:   Shonna Chock CMA on 03/11/2010   Method used:   Electronically to        [facility] (retail)       [address]       [place], [place]  [address]       Ph: [phone]       Fax: [phone]   RxID:   [id number]

## 2010-08-19 NOTE — Procedures (Signed)
Summary: colonoscopy  Anna [name] [name] Name: Anna [name], Anna [name] [name] MRN:  Procedure Procedures: Colonoscopy CPT: 81191.  Personnel: Endoscopist: Wilhemina Bonito. Marina Goodell, MD.  Referred By: Titus Dubin. Alwyn Ren, MD.  Exam Location: Exam performed in Outpatient Clinic. Outpatient  Anna [name] [name] Consent: Procedure, Alternatives, Risks and Benefits discussed, consent obtained, from Anna [name] [name]. Consent was obtained by the RN.  Indications  Average Risk Screening Routine.  History  Current Medications: Anna [name] [name] is not currently taking Coumadin.  Pre-Exam Physical: Performed Aug 02, 2005. Cardio-pulmonary exam, Rectal exam, Abdominal exam, Mental status exam WNL.  Comments: Pt. history reviewed/updated, physical exam performed prior to initiation of sedation? yes Exam Exam: Extent of exam reached: Terminal Ileum, extent intended: Terminal Ileum.  Anna [name] cecum was identified by appendiceal orifice and IC valve. Anna [name] [name] position: on left side. Anna [name] Cecum was reached at 12:04 PM. ended at 12:14 PM. Time for Withdrawl: 10:00. Colon retroflexion performed. Images taken. ASA Classification: I. Tolerance: excellent.  Monitoring: Pulse and BP monitoring, Oximetry used. Supplemental O2 given.  Colon Prep Used Miralax for colon prep. Prep results: good.  Sedation Meds: Anna [name] [name] assessed and found to be appropriate for moderate (conscious) sedation. Fentanyl 75 mcg. given IV. Versed 9 mg. given IV.  Findings NORMAL EXAM: Cecum to Rectum.  NORMAL EXAM: Ileum.   Assessment  Diagnoses: 455.6: Hemorrhoids, Internal and  External.   Comments: NO POLYPS SEEN Events  Unplanned Interventions: No intervention was required.  Unplanned Events: There were no complications. Plans Disposition: After procedure Anna [name] [name] sent to recovery. After recovery Anna [name] [name] sent home.  Scheduling/Referral: Colonoscopy, to Wilhemina Bonito. Marina Goodell, MD, IN 10 YEARS FOR REPEAET SCREENING (unless otherwise clinically indicated),    This report was created  from Anna [name] original endoscopy report, which was reviewed and signed by Anna [name] above listed endoscopist.   cc:   Marga Melnick, MD       Maxie Better, MD       Anna [name] Anna [name] [name]

## 2010-08-19 NOTE — Assessment & Plan Note (Signed)
Summary: abdominal pain   History of Present Illness Visit Type: Follow-up Visit Primary GI MD: Yancey Flemings MD Primary Jairo Bellew: Jettie Booze, MD  Requesting Accalia Rigdon: n/a Chief Complaint: Left side abd pain, nausea, and back pain  History of Present Illness:   60 year old female with hypertension, coronary artery disease, and endoscopic evidence of GERD. She has been seen in this office on several occasions for abdominal pain. She is known to have gallstones which have been felt likely a cause for her right-sided pain. She has seen a general surgeon who agreed and recommended cholecystectomy. She has not followed through with the recommendation. She has been seen by her primary Natsha Guidry regarding abdominal pain. She presents today for the same. She remains a suboptimal historian. She does have intermittent right-sided pain with some radiation to the back. A new left upper quadrant pain described as sharp. She has stopped her PPI. Denies GERD symptoms. Reports trouble breathing when lying flat at times. Feels that the problem is in her esophagus. Requests an upper endoscopy. No nausea vomiting or obvious dysphagia. Last endoscopy about 2 years ago revealing esophagitis, stricture, and hiatal hernia. Recently noted to have mild elevation of hepatic transaminases for which she is being followed by Dr. Alwyn Ren. No lower GI complaints   GI Review of Systems    Reports abdominal pain.     Location of  Abdominal pain: right upper quadrant and left upper quadrant.    Denies acid reflux, belching, bloating, chest pain, dysphagia with liquids, dysphagia with solids, heartburn, loss of appetite, nausea, vomiting, vomiting blood, weight loss, and  weight gain.        Denies anal fissure, black tarry stools, change in bowel habit, constipation, diarrhea, diverticulosis, fecal incontinence, heme positive stool, hemorrhoids, irritable bowel syndrome, jaundice, light color stool, liver problems, rectal  bleeding, and  rectal pain.    Current Medications (verified): 1)  Amlodipine Besylate 5 Mg Tabs (Amlodipine Besylate) .Marland Kitchen.. 1 By Mouth Once Daily. 2)  Losartan Potassium 100 Mg Tabs (Losartan Potassium) .Marland Kitchen.. 1 By Mouth Once Daily  Goal = Bp Average < 135/85 3)  Doxycycline Hyclate 100 Mg Caps (Doxycycline Hyclate) .... Crush in 5 Cc (1 Tsp) of Water  & Swish Well Over Tongue & Expectorate  Two Times A Day (Do Not Swallow)  Allergies (verified): No Known Drug Allergies  Past History:  Past Medical History: CAD  HYPERTENSION, ESSENTIAL NOS  CHOLELITHIASIS, HX OF  ESOPHAGEAL STRICTURE HEMATURIA, MICROSCOPIC, HX OF  GERD  RHINITIS  CHOLELITHIASIS HEMORRHOIDS--Internal and External  HIATAL HERNIA --EGD 07-2008 Abnormal TFTs 05/2006 - dyspnea;evaluated by Dr. Shelle Iron. Had Normal PFTs with mild reduction in DLCO.  VQ scan  was normal. Placed on expectant followup (ie to be seen prn) No recurrence since then  01/2006 - CT angiogram - negative for PE  Past Surgical History: Reviewed history from 05/12/2008 and no changes required. G2P1 Appendectomy Colonoscopy neg 2006 Hysterectomy 1994 : fibroids, dysfunctional menses  Family History: Mother-living, HTN, Parkinson'ds Father-deceased prostate CA (diagnosed after age 16) 3 sisters HTN 1 brother HTN, prostate CA (diagnosed about 58 yrs old) Family History of Prostate CA 1st degree relative <50; father Family History of Arthritis-Mother (severe) Family History Hypertension: mgm, brothers and sisters No FH GI disease No FH of Colon Cancer:  Social History: Reviewed history from 07/30/2008 and no changes required. 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   Review of  Systems       The patient complains of back pain.  The patient denies allergy/sinus, anemia, anxiety-new, arthritis/joint pain, blood in urine, breast  changes/lumps, change in vision, confusion, cough, coughing up blood, depression-new, fainting, fatigue, fever, headaches-new, hearing problems, heart murmur, heart rhythm changes, itching, menstrual pain, muscle pains/cramps, night sweats, nosebleeds, pregnancy symptoms, shortness of breath, skin rash, sleeping problems, sore throat, swelling of feet/legs, swollen lymph glands, thirst - excessive , urination - excessive , urination changes/pain, urine leakage, vision changes, and voice change.    Vital Signs:  Patient profile:   60 year old female Height:      61 inches Weight:      156 pounds BMI:     29.58 BSA:     1.70 Pulse rate:   60 / minute Pulse rhythm:   regular BP sitting:   126 / 74  (left arm) Cuff size:   regular  Vitals Entered By: Ok Anis CMA (July 13, 2010 1:37 PM)  Physical Exam  General:  Well developed, well nourished, no acute distress. Head:  Normocephalic and atraumatic. Eyes:  PERRLA, no icterus. Mouth:  No deformity or lesions, dentition normal. Neck:  Supple; no masses or thyromegaly. Lungs:  Clear throughout to auscultation. Heart:  Regular rate and rhythm; no murmurs, rubs,  or bruits. Abdomen:  Soft, nontender and nondistended. No masses, hepatosplenomegaly or hernias noted. Normal bowel sounds. Msk:  Symmetrical with no gross deformities. Normal posture. Pulses:  Normal pulses noted. Extremities:  No clubbing, cyanosis, edema or deformities noted. Neurologic:  Alert and  oriented x4;  grossly normal neurologically. Skin:  Intact without significant lesions or rashes. Psych:  Alert and cooperative. Normal mood and affect.   Impression & Recommendations:  Problem # 1:  ABDOMINAL PAIN, EPIGASTRIC (ICD-789.06) chronic recurrent epigastric right upper quadrant pain still felt most likely due to cholelithiasis. This was the feeling of the general surgeon. Recommended to her to reestablish with the surgeon regarding possible laparoscopic  cholecystectomy as this is the treatment of choice for symptomatic gallstones  Problem # 2:  ABDOMINAL PAIN, LEFT UPPER QUADRANT (ICD-789.02)  new onset left upper quadrant discomfort. Vaguely described. Etiology uncertain.  Plan:  #1. Diagnostic EGD  Orders: EGD (EGD)  Problem # 3:  CHOLELITHIASIS (ICD-574.20) felt to be responsible for at least some of her chronic recurrent abdominal complaints  Problem # 4:  ESOPHAGEAL REFLUX (ICD-530.81)  endoscopic evidence of reflux. Now off PPI. Wonder pharyngeal symptoms related. We'll evaluate with EGD. Could consider reinitiating PPI  Orders: EGD (EGD)  Patient Instructions: 1)  Upper Endoscopy brochure given. 2)   copy. Dr. Marga Melnick 3)    4)  The medication list was reviewed and reconciled.  All changed / newly prescribed medications were explained.  A complete medication list was provided to the patient / caregiver.

## 2010-08-19 NOTE — Medication Information (Signed)
Summary: Prior Authorization & Denial for Celebrex/[employer]  Prior Authorization & Denial for Celebrex/[employer]   Imported By: Lanelle Bal 12/04/2009 09:08:39  _____________________________________________________________________  External Attachment:    Type:   Image     Comment:   External Document

## 2010-08-19 NOTE — Progress Notes (Signed)
Summary: fyi fyi decline UC WHEEZING,SORE THROAT  Phone Note Call from Patient Call back at Home Phone [phone]   Caller: Patient Call For: Marga Melnick MD Reason for Call: Talk to Nurse Summary of Call: PATIENT CALLING REQUESTING APPT TO SEE DR. Laurice Iglesia.  PT C/O WHEEZING IN HER CHEST, ALSO SORE ON LEFT-SIDE RIB CAGE, GOING ON X4 DAYS.  ALSO COMPLAINS OF SORE THROAT.  NO APPTS AVAIL IN SYSTEM, PT REQUESTS TO SPEAK W/NURSE. Initial call taken by: Magdalen Spatz [facility],  August 06, 2009 11:49 AM  Follow-up for Phone Call        pt c/o sore throat, chest congestion, coughing, wheezing, SOB pain left side, frequency and urgency with urination x4days. pt denies any swelling, redness or  bruising on side, fever, burning or blood in urine. pt offer UC and appt for tomorrow, pt decline pt unable to come in at schedule time. pt states that she will call on tomorrow so that she may schedule for saturday clinic which fits her schedule better. pt advise that if symptoms increase or change she needs to go to UC pt ok..........Marland KitchenFelecia Deloach CMA  August 06, 2009 12:33 PM     Follow-up by: Marga Melnick MD,  August 06, 2009 1:46 PM  Additional Follow-up for Phone Call Additional follow up Details #1::        noted Additional Follow-up by: Marga Melnick MD,  August 06, 2009 1:46 PM

## 2010-08-19 NOTE — Letter (Signed)
Summary: [facility]  [facility]   Imported By: Lanelle Bal 10/16/2009 11:04:03  _____________________________________________________________________  External Attachment:    Type:   Image     Comment:   External Document

## 2010-08-19 NOTE — Miscellaneous (Signed)
Summary: Appointment Canceled  Appointment status changed to canceled by [employer] on 02/05/2010 4:56 PM.  Cancellation Comments --------------------- crs/dx:chest pain/wt:154/ins:bcbs/dr hopper  Appointment Information ----------------------- Appt Type:  CARDIOLOGY NUCLEAR TESTING      Date:  Wednesday, February 10, 2010      Time:  8:00 AM for 15 min   Urgency:  Routine   Made By:  Pearson Grippe  To Visit:  [id number]    Reason:  crs/dx:chest pain/wt:154/ins:bcbs/dr hopper  Appt Comments ------------- -- 02/05/10 16:56: ([facility]) CANCELED -- crs/dx:chest pain/wt:154/ins:bcbs/dr hopper -- 02/05/10 15:26: ([facility]) BOOKED -- Routine CARDIOLOGY NUCLEAR TESTING at 02/10/2010 8:00 AM for 15 min crs/dx:chest pain/wt:154/ins:bcbs/dr hopper

## 2010-08-19 NOTE — Procedures (Signed)
Summary: Upper Endoscopy  Patient: Tyera Hansley Note: All result statuses are Final unless otherwise noted.  Tests: (1) Upper Endoscopy (EGD)   EGD Upper Endoscopy       DONE     [facility]     [address]     [place], [place]  [address]           ENDOSCOPY PROCEDURE REPORT           PATIENT:  Francetta, Ilg  MR#:  #[id number]     BIRTHDATE:  06-Nov-1950, 59 yrs. old  GENDER:  female           ENDOSCOPIST:  Wilhemina Bonito. Eda Keys, MD     Referred by:  Office           PROCEDURE DATE:  08/10/2010     PROCEDURE:  EGD, diagnostic 19147     ASA CLASS:  Class II     INDICATIONS:  abdominal pain           MEDICATIONS:   Fentanyl 50 mcg IV, Versed 6 mg IV     TOPICAL ANESTHETIC:  Exactacain Spray           DESCRIPTION OF PROCEDURE:   After the risks benefits and     alternatives of the procedure were thoroughly explained, informed     consent was obtained.  The LB GIF-H180 [id number] endoscope was     introduced through the mouth and advanced to the second portion of     the duodenum, without limitations.  The instrument was slowly     withdrawn as the mucosa was fully examined.     <<PROCEDUREIMAGES>>           Mild Esophagitis was found in the distal esophagus.  A benign     ring-like stricture was found in the distal esophagus.  The     stomach was entered and closely examined. The antrum, angularis,     and lesser curvature were well visualized, including a retroflexed     view of the cardia and fundus. The stomach wall was normally     distensable. The scope passed easily through the pylorus into the     duodenum.  The duodenal bulb was normal in appearance, as was the     postbulbar duodenum.    Retroflexed views revealed a small hiatal     hernia.    The scope was then withdrawn from the patient and the     procedure completed.           COMPLICATIONS:  None           ENDOSCOPIC IMPRESSION:     1) Mild Esophagitis in the distal esophagus     2) Stricture in the  distal esophagus     3) Normal stomach     4) Normal duodenum     5) GERD           RECOMMENDATIONS:     1) Anti-reflux regimen to be followed     2) Prilosec OTC 20mg  each morning     3) For recurring abdominal pain despite PPI, see your surgeon     about possible cholecystectomy.           ______________________________     Wilhemina Bonito. Eda Keys, MD           CC:  Pecola Lawless, MD;   The Patient  n.     eSIGNED:   Wilhemina Bonito. Eda Keys at 08/10/2010 02:48 PM           Marchia Meiers, #[id number]  Note: An exclamation mark (!) indicates a result that was not dispersed into the flowsheet. Document Creation Date: 08/11/2010 8:45 AM _______________________________________________________________________  (1) Order result status: Final Collection or observation date-time: 08/10/2010 14:41 Requested date-time:  Receipt date-time:  Reported date-time:  Referring Physician:   Ordering Physician: Fransico Setters [phone]) Specimen Source:  Source: Launa Grill Order Number: [id number] Lab site:

## 2010-08-19 NOTE — Miscellaneous (Signed)
Summary: Orders Update  Clinical Lists Changes  Orders: Added new Referral order of Misc. Referral (Misc. Ref) - Signed 

## 2010-08-19 NOTE — Progress Notes (Signed)
Summary: bone scan orders  ---- Converted from flag ---- ---- 12/02/2009 11:34 AM, Neena Rhymes MD wrote: if that's the way it has to be, that's fine.  thanks  ---- 12/02/2009 11:32 AM, Magdalen Spatz [facility] wrote: In ref to Bone Scan, after calling Radiology back & telling them "Limited" Scan, they spoke w/ Rad Tech who states this must be ordered as "Full Body" because it is a cavity, and not just a particular body part like foot or knee...Marland KitchenMarland KitchenMarland Kitchen  Is this OK? ------------------------------

## 2010-08-19 NOTE — Assessment & Plan Note (Signed)
Summary: pain in ribcage on leftside towards back/kdc   Vital Signs:  Patient profile:   60 year old female Weight:      153 pounds Temp:     97.4 degrees F oral Pulse rate:   62 / minute Pulse rhythm:   regular BP sitting:   138 / 84  (left arm) Cuff size:   regular  Vitals Entered By: Army Fossa CMA (September 22, 2009 1:07 PM) CC: Pt here she was seen by Dr.Hopp 2 weeks for the pain in her ribcage. The pain has not gotten any better, it comes and goes.   History of Present Illness: Pt here still c/o L sided rib pain.  Started few months ago.  Pt was seen 2 weeks ago and given tramadol and told to take glucosamine.  No better.   No urinary symptoms.   No coughing now although it did start with coughing and URI symptoms.     Allergies (verified): No Known Drug Allergies  Past History:  Past Medical History: Last updated: 08/25/2009 Hypertension Abnormal TFTs ABNORMAL ELECTROCARDIOGRAM (ICD-794.31) 05/2006 - dyspnea;evaluated by Dr. Shelle Iron. Had Normal PFTs with mild reduction in DLCO.  VQ scan  was normal. Placed on expectant followup (ie to be seen prn) No recurrence since then  01/2006 - CT angiogram - negative for PE  Past Surgical History: Last updated: 05/12/2008 G2P1 Appendectomy Colonoscopy neg 2006 Hysterectomy 1994 : fibroids, dysfunctional menses  Family History: Last updated: 07/30/2008 Mother-living, HTN, Parkinson'ds Father-deceased prostate CA (diagnosed after age 18) 3 sisters HTN 1 brother HTN, prostate CA (diagnosed about 60 yrs old) Family History of Prostate CA 1st degree relative <50; father  Family History of Arthritis-Mother (severe)  Family History Hypertension: mgm, brothers and sisters No FH GI disease  Social History: Last updated: 07/30/2008 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   Risk Factors: Exercise: no  (07/30/2008)  Risk Factors: Smoking Status: never (05/12/2008)  Family History: Reviewed history from 07/30/2008 and no changes required. Mother-living, HTN, Parkinson'ds Father-deceased prostate CA (diagnosed after age 64) 3 sisters HTN 1 brother HTN, prostate CA (diagnosed about 60 yrs old) Family History of Prostate CA 1st degree relative <50; father  Family History of Arthritis-Mother (severe)  Family History Hypertension: mgm, brothers and sisters No FH GI disease  Social History: Reviewed history from 07/30/2008 and no changes required. 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   Review of Systems      See HPI  Physical Exam  General:  Well-developed,well-nourished,in no acute distress; alert,appropriate and cooperative throughout examination Lungs:  Normal respiratory effort, chest expands symmetrically. Lungs are clear to auscultation, no crackles or wheezes. Heart:  normal rate and no murmur.   Msk:  + l rib pain ---low ribs with palpation-- no crepitus Psych:  Oriented X3 and normally interactive.     Impression & Recommendations:  Problem # 1:  RIB PAIN, LEFT SIDED (ICD-786.50)  Orders: T-2 View CXR (71020TC) T-Ribs Unilateral 2 Views (71100TC) UA Dipstick w/o Micro (manual) (04540)  Problem # 2:  CHEST WALL PAIN, ACUTE (ICD-786.52)  Orders: T-2 View CXR (71020TC) T-Ribs Unilateral 2 Views (71100TC) UA Dipstick w/o Micro (manual) (98119)  Reviewed EKG (see interpretation) and treatment options. Patient instructed to call for worsening pain, or new symptoms.   Her updated medication  list for this problem includes:    Celebrex 200 Mg Caps (Celecoxib) .Marland Kitchen... 1 by mouth once daily  Problem # 3:  HEMATURIA, MICROSCOPIC, HX OF (ICD-V13.09) recheck 2 weeks  check for kidney stone Orders: T-Culture, Urine ([id number]) UA Dipstick w/o Micro  (manual) ([address])  Complete Medication List: 1)  Amlodipine Besylate 5 Mg Tabs (Amlodipine besylate) .Marland Kitchen.. 1 qd 2)  Avapro 150 Mg Tabs (Irbesartan) .Marland Kitchen.. 1 qd 3)  Prilosec Otc 20 Mg Tbec (Omeprazole magnesium) .... Take 1 by mouth once daily 4)  Align Caps (Probiotic product) .Marland Kitchen.. 1 capsule by mouth once daily 5)  Azithromycin 250 Mg Tabs (Azithromycin) .... As per pack 6)  Celebrex 200 Mg Caps (Celecoxib) .Marland Kitchen.. 1 by mouth once daily  Appended Document: pain in ribcage on leftside towards back/kdc     Clinical Lists Changes  Observations: Added new observation of COMMENTS: Army Fossa CMA  September 22, 2009 1:42 PM  (09/22/2009 13:41) Added new observation of PH URINE: 5.0  (09/22/2009 13:41) Added new observation of SPEC GR URIN: <1.005  (09/22/2009 13:41) Added new observation of APPEARANCE U: Clear  (09/22/2009 13:41) Added new observation of UA COLOR: yellow  (09/22/2009 13:41) Added new observation of WBC DIPSTK U: negative  (09/22/2009 13:41) Added new observation of NITRITE URN: negative  (09/22/2009 13:41) Added new observation of UROBILINOGEN: 0.2  (09/22/2009 13:41) Added new observation of PROTEIN, URN: negative  (09/22/2009 13:41) Added new observation of BLOOD UR DIP: negative  (09/22/2009 13:41) Added new observation of KETONES URN: negative  (09/22/2009 13:41) Added new observation of BILIRUBIN UR: negative  (09/22/2009 13:41) Added new observation of GLUCOSE, URN: negative  (09/22/2009 13:41)      Laboratory Results   Urine Tests    Routine Urinalysis   Color: yellow Appearance: Clear Glucose: negative   (Normal Range: Negative) Bilirubin: negative   (Normal Range: Negative) Ketone: negative   (Normal Range: Negative) Spec. Gravity: <1.005   (Normal Range: 1.003-1.035) Blood: negative   (Normal Range: Negative) pH: 5.0   (Normal Range: 5.0-8.0) Protein: negative   (Normal Range: Negative) Urobilinogen: 0.2   (Normal Range: 0-1) Nitrite: negative    (Normal Range: Negative) Leukocyte Esterace: negative   (Normal Range: Negative)    Comments: Army Fossa CMA  September 22, 2009 1:42 PM     Appended Document: pain in ribcage on leftside towards back/kdc Pt had a trace of blood in urine.

## 2010-08-19 NOTE — Assessment & Plan Note (Signed)
Summary: bumps on tongue//burning sensation when eating//lch   Vital Signs:  Patient profile:   60 year old female Weight:      153.2 pounds Temp:     98.4 degrees F oral Pulse rate:   64 / minute Resp:     15 per minute BP sitting:   118 / 80  (left arm) Cuff size:   large  Vitals Entered By: Shonna Chock CMA (April 16, 2010 1:52 PM) CC: 1.) Bumps on tounge  2.) Discuss LFT's   3.) Other concerns- ongoing pain, Heartburn   Primary Care Provider:  Jettie Booze, MD   CC:  1.) Bumps on tounge  2.) Discuss LFT's   3.) Other concerns- ongoing pain and Heartburn.  History of Present Illness: She has concernes  about the elevated LFTs . Chart was reviewed ; from 04/2008 to 03/2010 ALT  has ranged 31-39 & AST 30-43. She drinks only wine occasionally, 1-2 glasses / week. In 05/2008 [place] documented  11 mm gallstone; CAT scan of abd 11/2008: negative..Abdominal pain has persisted  > 2 years. EGD 07/2008 by Dr Marina Goodell : [facility], esophagitis & stricture. The patient reports  bilateral UQ & epigastric pain and weight loss of 5#, but denies acid reflux, sour taste in mouth, and trouble swallowing.  The patient denies the following alarm features: melena, dysphagia, hematemesis, and vomiting.  Symptoms are worse with onions.  Treatment that was tried and either found to be ineffective or stopped due to problems include a PPI( Prilosec OTC). She has sensation of fullness in throat. She has had "irritation" of tongue for 10 days; "bumps " on anterior tongue today.  She chews peppermint gum daily.  Allergies (verified): No Known Drug Allergies  Physical Exam  General:  well-nourished,in no acute distress; alert,appropriate and cooperative throughout examination Eyes:  No corneal or conjunctival inflammation noted. No icterus Mouth:  Oral mucosa and oropharynx without lesions or exudates.  Teeth in good repair. No pharyngeal erythema.  Superficial   denudation of ant tongue Lungs:  Normal respiratory  effort, chest expands symmetrically. Lungs are clear to auscultation, no crackles or wheezes. Heart:  normal rate, regular rhythm, no gallop, no rub, no JVD, no HJR, and grade 1/2  /6  intermittent LSB systolic murmur.   Abdomen:  Bowel sounds positive,abdomen soft and non-tender without masses, organomegaly or hernias noted. Skin:  Intact without suspicious lesions or rashes Cervical Nodes:  No lymphadenopathy noted Axillary Nodes:  No palpable lymphadenopathy Psych:  memory intact for recent and remote, normally interactive, and good eye contact.     Impression & Recommendations:  Problem # 1:  NONSPEC ELEVATION OF LEVELS OF TRANSAMINASE/LDH (ICD-790.4)  Problem # 2:  GLOSSITIS (ICD-529.0)  Problem # 3:  ESOPHAGEAL REFLUX (ICD-530.81)  Her updated medication list for this problem includes:    Prilosec Otc 20 Mg Tbec (Omeprazole magnesium) .Marland Kitchen... Take 1 by mouth once daily (ineffective)    Hyoscyamine Sulfate 0.125 Mg Subl (Hyoscyamine sulfate) .Marland Kitchen... 1 sl every 4 -6 hrs prn    Zegerid 40-1100 Mg Caps (Omeprazole-sodium bicarbonate) .Marland Kitchen... 1 once daily  Problem # 4:  ESOPHAGEAL STRICTURE (ICD-530.3) PMH of  in context of # 3  Problem # 5:  CHOLELITHIASIS, HX OF (ICD-V12.79) 11 mm stone  Complete Medication List: 1)  Prilosec Otc 20 Mg Tbec (Omeprazole magnesium) .... Take 1 by mouth once daily (ineffective) 2)  Align Caps (Probiotic product) .Marland Kitchen.. 1 capsule by mouth once daily 3)  Celebrex 200 Mg Caps (Celecoxib) .Marland KitchenMarland KitchenMarland Kitchen  1 by mouth once daily 4)  Hyoscyamine Sulfate 0.125 Mg Subl (Hyoscyamine sulfate) .Marland Kitchen.. 1 sl every 4 -6 hrs prn 5)  Amlodipine Besylate 5 Mg Tabs (Amlodipine besylate) .Marland Kitchen.. 1 by mouth once daily. 6)  Losartan Potassium 100 Mg Tabs (Losartan potassium) .Marland Kitchen.. 1 by mouth once daily  goal = bp average < 135/85 7)  Zegerid 40-1100 Mg Caps (Omeprazole-sodium bicarbonate) .Marland Kitchen.. 1 once daily 8)  Doxycycline Hyclate 100 Mg Caps (Doxycycline hyclate) .... Crush in 5 cc (1 tsp) of  water  & swish well over tongue & expectorate  two times a day (do not swallow)  Patient Instructions: 1)  Avoid foods high in acid (tomatoes, citrus juices, spicy foods). Avoid eating within two hours of lying down or before exercising. Do not over eat; try smaller more frequent meals. Elevate head of bed twelve inches when sleeping. Stop peppermint gum. GI re-evaluation indicated if no better with new medication & these interventions. Prescriptions: DOXYCYCLINE HYCLATE 100 MG CAPS (DOXYCYCLINE HYCLATE) crush in 5 cc (1 tsp) of water  & swish well over tongue & expectorate  two times a day (DO NOT SWALLOW)  #14 x 0   Entered and Authorized by:   Marga Melnick MD   Signed by:   Marga Melnick MD on 04/16/2010   Method used:   Print then Give to Patient   RxID:   [id number] ZEGERID 40-1100 MG CAPS (OMEPRAZOLE-SODIUM BICARBONATE) 1 once daily  #30 x 2   Entered and Authorized by:   Marga Melnick MD   Signed by:   Marga Melnick MD on 04/16/2010   Method used:   Print then Give to Patient   RxID:   [id number]

## 2010-08-19 NOTE — Progress Notes (Signed)
Summary: Peer-to-peer Request  Phone Note From Other Clinic   Caller: Aleatha Borer Summary of Call: Message was left on Triage VM: Please call [phone] for a peer to peer review for recent test ordered. Call before 02/09/2010 4pm to avoid denial.   Shonna Chock CMA  February 08, 2010 5:23 PM   Follow-up for Phone Call        refer to Cardiology Follow-up by: Marga Melnick MD,  February 09, 2010 8:10 AM  Additional Follow-up for Phone Call Additional follow up Details #1::        Patient is set up with cardiology tomorrow for a OV.  Additional Follow-up by: Harold Barban,  February 09, 2010 10:32 AM

## 2010-08-19 NOTE — Letter (Signed)
Summary: EGD Instructions  [facility]  [address] [place], [place] [address]   Phone: [phone]  Fax: [phone]       [name]    [birth year]    MRN: [id number]       Procedure Day /Date: Tuesday January 24th, 2012     Arrival Time:  1:00pm     Procedure Time: 2:00pm     Location of Procedure:                    _ x _ [facility] (4th Floor)    PREPARATION FOR ENDOSCOPY   On 08/10/10 THE DAY OF THE PROCEDURE:  1.   No solid foods, milk or milk products are allowed after midnight the night before your procedure.  2.   Do not drink anything colored red or purple.  Avoid juices with pulp.  No orange juice.  3.  You may drink clear liquids until 12:00pm, which is 2 hours before your procedure.                                                                                                CLEAR LIQUIDS INCLUDE: Water Jello Ice Popsicles Tea (sugar ok, no milk/cream) Powdered fruit flavored drinks Coffee (sugar ok, no milk/cream) Gatorade Juice: apple, white grape, white cranberry  Lemonade Clear bullion, consomm, broth Carbonated beverages (any kind) Strained chicken noodle soup Hard Candy   MEDICATION INSTRUCTIONS  Unless otherwise instructed, you should take regular prescription medications with a small sip of water as early as possible the morning of your procedure.        OTHER INSTRUCTIONS  You will need a responsible adult at least 58 years of age to accompany you and drive you home.   This person must remain in the waiting room during your procedure.  Wear loose fitting clothing that is easily removed.  Leave jewelry and other valuables at home.  However, you may wish to bring a book to read or an iPod/MP3 player to listen to music as you wait for your procedure to start.  Remove all body piercing jewelry and leave at home.  Total time from sign-in until discharge is approximately 2-3 hours.  You should go home  directly after your procedure and rest.  You can resume normal activities the day after your procedure.  The day of your procedure you should not:   Drive   Make legal decisions   Operate machinery   Drink alcohol   Return to work  You will receive specific instructions about eating, activities and medications before you leave.    The above instructions have been reviewed and explained to me by   _______________________    I fully understand and can verbalize these instructions _____________________________ Date _________

## 2010-08-19 NOTE — Progress Notes (Signed)
Summary: Refill Request (Not on med list)  Phone Note Refill Request Call back at Work Phone [phone]   Refills Requested: Medication #1:  AVAPRO patient wants refill - not on med list - she doesnt know mg - [name]   Initial call taken by: Okey Regal [place],  March 09, 2010 1:30 PM  Follow-up for Phone Call        I called patient and informed her med was removed from her list by Cardiology on 02/10/2010. Patient states she told them she wasnt taking med cause she just didnt take it (no particular reason), patient not sure how long she has been off med but would now like to get back on track.  Dr.Hopper please advise on patient restarting med and ? if she needs to f/u befor or after starting med Follow-up by: Shonna Chock CMA,  March 09, 2010 1:37 PM  Additional Follow-up for Phone Call Additional follow up Details #1::        Avapro 150 mg once daily # 30 RX 5. Monitor BP ; goal = average < 135/85 Additional Follow-up by: Marga Melnick MD,  March 09, 2010 4:34 PM    Additional Follow-up for Phone Call Additional follow up Details #2::    left pt detail message rx sent to pharmacy...............Marland KitchenFelecia Deloach CMA  March 09, 2010 5:13 PM   New/Updated Medications: AVAPRO 150 MG TABS (IRBESARTAN) Take once dailyMonitor BP ; goal = average < 135/85 Prescriptions: AVAPRO 150 MG TABS (IRBESARTAN) Take once dailyMonitor BP ; goal = average < 135/85  #30 x 5   Entered by:   Jeremy Johann CMA   Authorized by:   Marga Melnick MD   Signed by:   Jeremy Johann CMA on 03/09/2010   Method used:   Faxed to ...       [name] (retail)       [address]       [place], [place]  [address]       Ph: [phone]       Fax: [phone]   RxID:   [id number]

## 2010-08-19 NOTE — Medication Information (Signed)
Summary: Approval for Celebrex/[employer]  Approval for Celebrex/[employer]   Imported By: Lanelle Bal 12/16/2009 09:05:30  _____________________________________________________________________  External Attachment:    Type:   Image     Comment:   External Document

## 2010-08-19 NOTE — Assessment & Plan Note (Signed)
Summary: pain in side//lch   Vital Signs:  Patient profile:   60 year old female Weight:      152 pounds O2 Sat:      98 % on Room air Pulse rate:   64 / minute BP sitting:   124 / 76  (left arm)  Vitals Entered By: Doristine Devoid (Nov 25, 2009 11:52 AM)  O2 Flow:  Room air CC: L side pain near rib cage getting worse also hurts to take deep breath    History of Present Illness: 60 yo woman here today for L sided pain.  has seen Dr Laury Axon and Dr Alwyn Ren for this previously.  sxs started months ago.  pain is now continuous- hurts w/ raising arms, bending over, lying on that side.  was told previously it was a floating rib.  has had (-) xrays.  painful w/ deep breath.  initially was given pain meds but this prevented pt from thinking clearly.  was taking Celebrex which initially provided relief- out of samples.  not taking anything for pain except occasional ibuprofen.  pain starts mid-axillary line and radiates forward.  pain is a 8/10, aching pain.  when asked if muscular or bony pain pt responded 'how should i know?'  SOB- occurs w/ stairs and walking on inclines.  hx of similar- saw pulmonary for this.  w/u was (-) by pt report.  reports sxs have been present x1-2 weeks.  Current Medications (verified): 1)  Amlodipine Besylate 5 Mg  Tabs (Amlodipine Besylate) .Marland Kitchen.. 1 Qd 2)  Avapro 150 Mg  Tabs (Irbesartan) .Marland Kitchen.. 1 Qd 3)  Prilosec Otc 20 Mg Tbec (Omeprazole Magnesium) .... Take 1 By Mouth Once Daily 4)  Align  Caps (Probiotic Product) .Marland Kitchen.. 1 Capsule By Mouth Once Daily 5)  Celebrex 200 Mg Caps (Celecoxib) .Marland Kitchen.. 1 By Mouth Once Daily 6)  Cyclobenzaprine Hcl 10 Mg  Tabs (Cyclobenzaprine Hcl) .Marland Kitchen.. 1 By Mouth 2 Times Daily As Needed For Pain.  Will Cause Drowsiness  Allergies (verified): No Known Drug Allergies  Past History:  Past Medical History: Last updated: 08/25/2009 Hypertension Abnormal TFTs ABNORMAL ELECTROCARDIOGRAM (ICD-794.31) 05/2006 - dyspnea;evaluated by Dr. Shelle Iron. Had  Normal PFTs with mild reduction in DLCO.  VQ scan  was normal. Placed on expectant followup (ie to be seen prn) No recurrence since then  01/2006 - CT angiogram - negative for PE  Review of Systems      See HPI  Physical Exam  General:  Well-developed,well-nourished,in no acute distress; alert,appropriate and cooperative throughout examination Chest Wall:  chest wall tenderness L inferior rib margin.   Lungs:  Normal respiratory effort, chest expands symmetrically. Lungs are clear to auscultation, no crackles or wheezes. Heart:  normal rate and no murmur.   Abdomen:  soft, NT/ND, +BS Psych:  seemingly agitated, mildly hostile   Impression & Recommendations:  Problem # 1:  RIB PAIN, LEFT SIDED (ICD-786.50) Assessment Unchanged pt has seen 2 other doctors for exact same sxs.  (-) CXR, normal EKG.  unclear as to etiology.  pt unhappy w/ lack of answers.  will do bone scan to evaluate for possible malignancy or other bone destruction responsible for pt's pain.  in interim- restart Celebrex as this provided temporary relief.  flexeril as needed for muscle spasm and sleep. Orders: EKG w/ Interpretation (93000) Radiology Referral (Radiology)  Problem # 2:  SHORTNESS OF BREATH (ICD-786.05) Assessment: New pt had extensive w/u in past.  asymptomatic at time of exam.  encouraged her to f/u  w/ PCP and/or pulm if sxs continue.  Complete Medication List: 1)  Amlodipine Besylate 5 Mg Tabs (Amlodipine besylate) .Marland Kitchen.. 1 qd 2)  Avapro 150 Mg Tabs (Irbesartan) .Marland Kitchen.. 1 qd 3)  Prilosec Otc 20 Mg Tbec (Omeprazole magnesium) .... Take 1 by mouth once daily 4)  Align Caps (Probiotic product) .Marland Kitchen.. 1 capsule by mouth once daily 5)  Celebrex 200 Mg Caps (Celecoxib) .Marland Kitchen.. 1 by mouth once daily 6)  Cyclobenzaprine Hcl 10 Mg Tabs (Cyclobenzaprine hcl) .Marland Kitchen.. 1 by mouth 2 times daily as needed for pain.  will cause drowsiness 7)  Ibuprofen 800 Mg Tabs (Ibuprofen) .Marland Kitchen.. 1 tab by mouth q8 x7 days and then as needed for  pain  Patient Instructions: 1)  Please schedule a follow up appt with Dr Alwyn Ren to re-visit your shortness of breath 2)  Someone will call you with your bone scan appt to evaluate your rib pain 3)  Take the Celebrex daily- you may add tylenol as needed 4)  Use the cyclobenzaprine at night to help w/ pain and sleep 5)  Ice and heat the area 6)  If your shortness of breath or pain worsens- please call or go to the ER 7)  Hang in there! Prescriptions: CYCLOBENZAPRINE HCL 10 MG  TABS (CYCLOBENZAPRINE HCL) 1 by mouth 2 times daily as needed for pain.  will cause drowsiness  #20 x 0   Entered and Authorized by:   Neena Rhymes MD   Signed by:   Neena Rhymes MD on 11/25/2009   Method used:   Electronically to        [facility] (retail)       [address]       [place], [place]  [address]       Ph: [phone]       Fax: [phone]   RxID:   [id number] CELEBREX 200 MG CAPS (CELECOXIB) 1 by mouth once daily  #30 x 3   Entered and Authorized by:   Neena Rhymes MD   Signed by:   Neena Rhymes MD on 11/25/2009   Method used:   Electronically to        [facility] (retail)       [address]       [place], [place]  [address]       Ph: [phone]       Fax: [phone]   RxID:   [id number]

## 2010-08-19 NOTE — Assessment & Plan Note (Signed)
Summary: pain in her throat area - jr   Vital Signs:  Patient profile:   60 year old female Weight:      155.4 pounds Temp:     98.9 degrees F oral Pulse rate:   60 / minute Resp:     15 per minute BP sitting:   136 / 90  (left arm) Cuff size:   regular  Vitals Entered By: Shonna Chock (August 25, 2009 4:07 PM) CC: Wheezing and back pain(middle) x 2 weeks-no known injury Comments REVIEWED MED LIST, PATIENT AGREED DOSE AND INSTRUCTION CORRECT    Primary Care Provider:  Jettie Booze, MD   CC:  Wheezing and back pain(middle) x 2 weeks-no known injury.  History of Present Illness: Onset 2 weeks ago as wheezing & some intermittent  L infra thoracic throbbing  pain lasting up to  35 min w/ o trigger.Rx: Tylenol w/o benefit. No  PMH of  asthma. No change in these 2 symptoms but  now head congsetion as of 08/23/2009. No radiation of pain ; no trigger or relievers.PMH of dyspnea with neg  Pulmonary eval except mild decrease in DLCO. That has not recurred.  Allergies (verified): No Known Drug Allergies  Past History:  Past Medical History: Hypertension Abnormal TFTs ABNORMAL ELECTROCARDIOGRAM (ICD-794.31) 05/2006 - dyspnea;evaluated by Dr. Shelle Iron. Had Normal PFTs with mild reduction in DLCO.  VQ scan  was normal. Placed on expectant followup (ie to be seen prn) No recurrence since then  01/2006 - CT angiogram - negative for PE  Review of Systems General:  Denies chills, fever, and sweats; Weight down 5#. ENT:  Complains of sore throat; denies nasal congestion and sinus pressure; No frontal headache, facial pain or purulence. "Tightness in head , like stuffed". CV:  Denies difficulty breathing at night, difficulty breathing while lying down, swelling of feet, and swelling of hands. Resp:  Complains of cough; denies chest pain with inspiration, coughing up blood, and sputum productive; Minor SOB with present illness. Derm:  Denies lesion(s) and rash.  Physical Exam  General:   well-nourished,in no acute distress; alert,appropriate and cooperative throughout examination Ears:  External ear exam shows no significant lesions or deformities.  Otoscopic examination reveals clear canals, tympanic membranes are intact bilaterally without bulging, retraction, inflammation or discharge. Hearing is grossly normal bilaterally. Nose:  External nasal examination shows no deformity or inflammation. Nasal mucosa are pink and moist without lesions or exudates. Small post L nare Mouth:  Oral mucosa and oropharynx without lesions or exudates.  Teeth in good repair. Neck:  No deformities, masses, or tenderness noted. Chest Wall:  chest wall tenderness L inferior rib margin.   Lungs:  Normal respiratory effort, chest expands symmetrically. Lungs are clear to auscultation, no crackles or wheezes. Minor cough Heart:  regular rhythm and bradycardia.   S4 Extremities:  No clubbing, cyanosis, edema. Homan' s negative   Skin:  Intact without suspicious lesions or rashes Cervical Nodes:  No lymphadenopathy noted Axillary Nodes:  No palpable lymphadenopathy Psych:  memory intact for recent and remote and subdued.     Impression & Recommendations:  Problem # 1:  WHEEZING (ICD-786.07) clinically not heard ; PFTS had revealed no RAD  in 2007  Problem # 2:  CHEST WALL PAIN, ACUTE (ICD-786.52)  Problem # 3:  URI (ICD-465.9)  Complete Medication List: 1)  Amlodipine Besylate 5 Mg Tabs (Amlodipine besylate) .Marland Kitchen.. 1 qd 2)  Avapro 150 Mg Tabs (Irbesartan) .Marland Kitchen.. 1 qd 3)  Levsin/sl 0.125 Mg Subl (  Hyoscyamine sulfate) .Marland Kitchen.. 1-2 sl q 4 hours as needed 4)  Prilosec Otc 20 Mg Tbec (Omeprazole magnesium) .... Take 1 by mouth once daily 5)  Align Caps (Probiotic product) .Marland Kitchen.. 1 capsule by mouth once daily 6)  Azithromycin 250 Mg Tabs (Azithromycin) .... As per pack 7)  Tramadol Hcl 50 Mg Tabs (Tramadol hcl) .Marland Kitchen.. 1 q 6 hrs as needed pain  Patient Instructions: 1)  Neti pot once daily for head  fullness. 2)  Drink as much fluid as you can tolerate for the next few days.Advair 100/50 one inhalation every 12 hrs ; gargle & spit after use. Glucosamine 1500 mg once daily  for 4-6 weeks for cartilage repair Prescriptions: TRAMADOL HCL 50 MG TABS (TRAMADOL HCL) 1 q 6 hrs as needed pain  #30 x 1   Entered and Authorized by:   Marga Melnick MD   Signed by:   Marga Melnick MD on 08/25/2009   Method used:   Faxed to ...       [facility] (retail)       [address]       [place], [place]  [address]       Ph: [phone]       Fax: [phone]   RxID:   [id number] AZITHROMYCIN 250 MG TABS (AZITHROMYCIN) as per pack  #1 x 0   Entered and Authorized by:   Marga Melnick MD   Signed by:   Marga Melnick MD on 08/25/2009   Method used:   Faxed to ...       [facility] (retail)       [address]       [place], [place]  [address]       Ph: [phone]       Fax: [phone]   RxID:   [id number]

## 2010-08-19 NOTE — Progress Notes (Signed)
Summary: Prior Authorization APPROVED CELEBREX  [employer]  Phone Note Refill Request Call back at [phone] Message from:  Fax from Pharmacy on Nov 25, 2009 3:17 PM  Refills Requested: Medication #1:  CELEBREX 200 MG CAPS 1 by mouth once daily Prior Authorization: [id number]   Method Requested: call insurance company Initial call taken by: Barnie Mort,  Nov 25, 2009 3:19 PM  Follow-up for Phone Call        awaiting fax...Marland KitchenMarland KitchenFelecia Deloach CMA  Nov 26, 2009 8:50 AM  fax back awaiting response............Marland KitchenFelecia Deloach CMA  Nov 26, 2009 9:48 AM   Additional Follow-up for Phone Call Additional follow up Details #1::        PRIOR IS NOT APPROVED BECAUSE THERE IS NO MEDICAL INFORMATION THAT STANDARD NSAIDs  would be medically contraindicated or ineffective for this member....................Marland KitchenFelecia Deloach CMA  Nov 27, 2009 4:15 PM     Additional Follow-up for Phone Call Additional follow up Details #2::    will change to ibuprofen 800mg  three times a day x7 days, take w/ food.  then as needed for pain.  please notify pt. Follow-up by: Neena Rhymes MD,  Nov 27, 2009 4:29 PM  Additional Follow-up for Phone Call Additional follow up Details #3:: Details for Additional Follow-up Action Taken: pt states that she is unable to tolerate med due to it causing her stomach to be upset . Pt states that she will not pick up current rx and would like another med rx pls advise............Marland KitchenFelecia Deloach CMA  Nov 27, 2009 5:02 PM  if pt cannot tolerate standard NSAIDs then that is what needs to be documented on the prior authorization form.  Celebrex is in a separate class from Ibuprofen, Naproxen, Diclofenac, etc.  Pt can wait on prior auth, pay for meds, or pick up ibuprofen.  Can also have Tramadol 50 mg 1-2 Q6 as needed for pain, #45 no refills (but she has said she doesn't like this either).  These are our options.  Neena Rhymes MD  Nov 28, 2009 1:40 PM.  pt aware will wait to  see if prior auth can be approved  awaiting to PA to be scan so that appeal process can start. Left message with appeal dept awaiting return call..................Marland KitchenFelecia Deloach CMA  Dec 01, 2009 9:12 AM    **********Doctor to doctor review will need to be done to inform them of new info that pt cannot tolerate standard NSAIDs. Physician to call [phone] ext [phone] Dr Kerry Hough.................Marland KitchenFelecia Deloach CMA  Dec 04, 2009 10:52 AM   called # listed above- told i needed a reference #.  after finally getting to the point of the phone call was told that the approving MD was in a meeting and unavailable.  will attempt to call back later today.  Neena Rhymes MD  Dec 07, 2009 9:18 AM.  according to Dr Randa Spike w/ new info of GI intolerance to NSAIDs pt will get approval on celebrex.  Neena Rhymes MD  Dec 07, 2009 1:05 PM.  prior auth approved 11-26-09 until 08-21-12, pharmacy faxed, pt aware...............Marland KitchenFelecia Deloach CMA  Dec 07, 2009 2:55 PM   New/Updated Medications: IBUPROFEN 800 MG TABS (IBUPROFEN) 1 tab by mouth Q8 x7 days and then as needed for pain Prescriptions: IBUPROFEN 800 MG TABS (IBUPROFEN) 1 tab by mouth Q8 x7 days and then as needed for pain  #60 x 0   Entered and Authorized by:   Neena Rhymes MD   Signed by:   Neena Rhymes MD  on 11/27/2009   Method used:   Electronically to        [facility]* (retail)       [address]       [place], [place]  [address]       Ph: [phone]       Fax: [phone]   RxID:   [id number]

## 2010-08-19 NOTE — Miscellaneous (Signed)
Summary: prilosec rx  Clinical Lists Changes  Medications: Added new medication of PRILOSEC OTC 20 MG  TBEC (OMEPRAZOLE MAGNESIUM) 1 each day 30 minutes before meal - Signed Rx of PRILOSEC OTC 20 MG  TBEC (OMEPRAZOLE MAGNESIUM) 1 each day 30 minutes before meal;  #30 x 11;  Signed;  Entered by: Greer Ee RN;  Authorized by: Hilarie Fredrickson MD;  Method used: Electronically to [facility] [address], [place], [place], [place]  [address], Ph: [phone], Fax: [phone]    Prescriptions: PRILOSEC OTC 20 MG  TBEC (OMEPRAZOLE MAGNESIUM) 1 each day 30 minutes before meal  #30 x 11   Entered by:   Greer Ee RN   Authorized by:   Hilarie Fredrickson MD   Signed by:   Greer Ee RN on 08/10/2010   Method used:   Electronically to        [facility] (retail)       [address]       [place], [place]  [address]       Ph: [phone]       Fax: [phone]   RxID:   [id number]

## 2010-08-30 ENCOUNTER — Encounter: Payer: Self-pay | Admitting: Internal Medicine

## 2010-09-14 NOTE — Consult Note (Signed)
Summary: [facility]  [facility]   Imported By: Maryln Gottron 09/08/2010 11:05:31  _____________________________________________________________________  External Attachment:    Type:   Image     Comment:   External Document

## 2010-11-01 ENCOUNTER — Ambulatory Visit (INDEPENDENT_AMBULATORY_CARE_PROVIDER_SITE_OTHER): Payer: BC Managed Care – PPO | Admitting: Internal Medicine

## 2010-11-01 ENCOUNTER — Encounter: Payer: Self-pay | Admitting: Internal Medicine

## 2010-11-01 VITALS — BP 124/86 | HR 56 | Temp 98.1°F | Wt 155.0 lb

## 2010-11-01 DIAGNOSIS — J029 Acute pharyngitis, unspecified: Secondary | ICD-10-CM

## 2010-11-01 DIAGNOSIS — J069 Acute upper respiratory infection, unspecified: Secondary | ICD-10-CM

## 2010-11-01 NOTE — Patient Instructions (Signed)
Please do not use echinacea every day. If you feel your immune system is  slightly compromised, take echinacea, 2000 mg  vitamin C, and zinc or  Airborne. Fill the antibiotic prescription only if you have the "pain, pus, and fever", true signs of  Sinusitis.

## 2010-11-01 NOTE — Progress Notes (Signed)
  Subjective:    Patient ID: Anna [name], female    DOB: April 29, 1951, 60 y.o.   MRN: [id number]  HPI UPPER RESPIRATORY INFECTION  Onset: 10/22/2010  Course: improving since 04/14 Better with: Ginger tea; Chloraseptic; nasal saline Meds tried: Loratidine Sick contacts: -workers  Nasal discharge (color,laterality): yellow secretions bilaterally  Sinusitis Risk Factors Fever: no   Headache/face pain: no  Double sickening: no  Tooth pain: no   Allergy Risk Factors: Sneezing: no  Itchy scratchy throat: no  Seasonal sx: yes, minimal   Flu Risk Factors Headache: no  Muscle aches: no  Severe fatigue: no    Red Flags  Stiff neck: no  Dyspnea: yes, minimal  Rash: no  Swallowing difficulty: no        Review of Systems     Objective:   Physical Exam General appearance is one of good health and nourishment. Without lymphadenopathy about the head, neck, or axilla. See current vital signs Ears:  External ear exam shows no significant lesions or deformities.  Otoscopic examination reveals clear canals, tympanic membranes are intact bilaterally without bulging, retraction, inflammation or discharge.  Heart:  Normal rate and regular rhythm. S1 and S2 normal without gallop, murmur, click, rub or other extra sounds.       Lungs:Chest clear to auscultation; no wheezes, rhonchi,rales ,or rubs present.No increased work of breathing.         Assessment & Plan:  #1 upper respiratory tract infection; the illness has lasted approximately 10 days but is improving clinically. She has had symptoms to suggest rhinosinusitis but is has improved significantly.  Plan: #1 I would recommend an antibiotic only if she develops facial pain, frontal headache or fever in the context of purulent secretions. She was given a sample of a Neti Rinse device for head congestion and also  Align  to replace normal organisms which should be in the system.

## 2010-11-30 NOTE — Letter (Signed)
Dec 07, 2006    Dr. Hilda Lias   RE:  Anna [name], Anna [name]  MRN:  [id number]  /  DOB:  [birth year]   Dear Lynne Logan:   [name] has been having increasing cervical and shoulder discomfort  which she believes is related to degenerative disk disease for which you  have evaluated her previously and  had  proposed surgical.  As the  symptoms are becoming more progressive and daily, she has requested  reevaluation.  Could you please have your office set that up;her phone  number is [phone].   She had come  for symptoms of possible irritable bowel manifested as  excess flatulance after a change in her diet.  She had increased  vegetables and  decreased meat over the last 4-5 months with the  appearance of this symptom. She was initially worried about possible  parasitism, but she has had no diarrhea or constitutional symptoms.  Her  weight loss has been expected.   The abdominal exam was unremarkable.  She had no organomegaly.  No  lymphadenopathy was present.  She had no icterus.   I have recommended a trial of Align.  If this probiotic is not  sufficient than elimination diet should be pursued.  Initially limiting  legumes and employing Beano with meals would be employed.  Subsequently,  if there is progression of symptoms then elimination of dairy and using  Lactaid will be pursued.   Her father has colon cancer.  Her colonoscopy in 2006 was negative.   She did have a blood pressure of 158/92; she had stopped her Benazepril  for no specific reason.I informed her this BP would be associated with a  six fold cardiovascular risk. Cardiopulmonary exam was normal except  some decreased in pedal pulses. No ischemic changes were noted.I will  ask her to take 1/2 to 1 full benazepril 20 mg daily to provide average  BP < 130/85. Unless her BP  & GI symptoms are uncontrolled there would  be no contraindication to any neurosurgical procedure, if such is deemed  clinically necessary.   Thank you for your evaluation and recommendation.    Sincerely,      Titus Dubin. Alwyn Ren, MD,FACP,FCCP  Electronically Signed    [facility]  DD: 12/07/2006  DT: 12/07/2006  Job #: [id number]

## 2010-11-30 NOTE — Assessment & Plan Note (Signed)
[facility]                                 ON-CALL NOTE   THANYA, CEGIELSKI                         MRN:          [id number]  DATE:12/04/2007                            DOB:          25-Sep-1950    TIME OF CALL:  6:41 p.m.   PHONE NUMBER:  [phone]   PRIMARY CARE PHYSICIAN:  Willow Ora, MD   CALLER:  Talbert Forest from [facility], that was her number from [facility]   The patient's phone number is: [phone].   She is calling to say the D-dimer that was ordered stat from the clinic  today was 0.23, which is within normal range. I called the patient and  informed her of the normal labs. She said she was feeling better and  relaxing right now and I encouraged her to check in with Dr. Drue Novel in the  morning for further plans.     Marne A. Tower, MD  Electronically Signed    [facility]/MedQ  DD: 12/04/2007  DT: 12/04/2007  Job #: [id number]

## 2010-12-03 NOTE — Discharge Summary (Signed)
[facility]  Patient:    Anna [name], Anna [name] Visit Number: [id number] MRN: [id number]          Service Type: MED Location: 3W [phone] Attending Physician:  Dolores Patty Dictated by:   Claretta Fraise, M.D. [facility] Admit Date:  11/20/2001 Discharge Date: 11/21/2001   CC:         Titus Dubin. Alwyn Ren, M.D. [facility]   Discharge Summary  DISCHARGE DIAGNOSES: 1. Atypical chest pain, rule out for myocardial infarction by cardiac enzymes.    The patient is scheduled for outpatient stress test. 2. Bronchitis. 3. Allergic rhinitis.  DISCHARGE MEDICATIONS: 1. Zithromax 250 mg p.o. q.d. x3 more days. 2. Nasacort AQ two sprays in each nostril once a day. 3. Saline nasal spray two sprays in each nostril q.i.d. 4. Tussionex 1 tsp p.o. b.i.d. p.r.n.  The patient is to use this for extreme    cough or pain. 5. Ibuprofen three tablets by mouth three times a day with food as needed for    chest pain or extra strength Tylenol two tablets by mouth four times a day    as needed for chest wall pain. 6. Clarinex one tablet p.o. q.d. 7. Robitussin DM 2 tsp by mouth four times a day.  ACTIVITY:  As tolerated.  DIET:  No restrictions.  FOLLOWUP:  Follow up as needed with Dr. Alwyn Ren.  The patient is scheduled for a stress test on Dec 07, 2001, at 12:30 p.m. with Roma Schanz.  The patient has been given instructions on this test.  HOSPITAL COURSE:  This patient was admitted by Dr. Alwyn Ren when he had seen her in clinic yesterday with complaints of nonproductive cough and some atypical chest wall pain that she noted was dull in nature with associated shortness of breath and diaphoresis, but no nausea or vomiting.  Apparently, the chest pain was relieved lasting a few minutes.  She denied any history of hypertension, no history of diabetes.  She is not a smoker.  There is no family history of myocardial infarction.  Father with hypertension.  The patient, during  the hospital admission, continued to have some chest pain, but his cardiac enzymes were really unremarkable with levels of 87 and 72 respectively for the first and second set with troponins of 0.01 and 0.02 respectively.  EKG showed normal sinus rhythm.  The patient successfully ruled out for myocardial infarction and is being treated for a bronchitis type of a picture.  She also has component of allergic rhinitis for which she will go home on Clarinex, Nasacort and saline nasal spray with some Robitussin and Tussionex available for her also.  CONDITION ON DISCHARGE:  The patient is being discharged to home in stable condition. Dictated by:   Claretta Fraise, M.D. [facility] Attending Physician:  Dolores Patty DD:  11/21/01 TD:  11/24/01 Job: [id number]

## 2010-12-03 NOTE — Assessment & Plan Note (Signed)
[facility]                             PULMONARY OFFICE NOTE   RATASHA, FABRE                         MRN:          [id number]  DATE:06/05/2006                            DOB:          1950/09/29    PULMONARY CONSULTATION   HISTORY OF PRESENT ILLNESS:  The patient is a 60 year old female who I  have been asked to see for dyspnea.  The patient states that she began  to have shortness of breath and atypical chest pain starting about 3-1/2  years ago.  This improved over time and, in the last 6-7 months, she  feels that it has returned.  She has a mild dry cough but no mucus  production.  She states that her dyspnea on exertion occurs with walking  up steps or hills.  On rare occasions, it can occur at rest.  She has no  difficulties bringing groceries in from the car, vacuuming a room of her  house, or making a bed.  She feels that, overall, it is probably related  to moderate to heavier exertional activities.  The patient states that  she can walk on a treadmill or flat ground with no difficulties.  The  patient denies any history of childhood asthma and has never smoked.  She does complain of chest pain in the center that is not pleuritic in  nature.  It is described as a dull ache.  This comes and goes throughout  the day with no rhyme or reason.  The patient has had a recent cardiac  workup with a Cardiolite in August showing a normal ejection fraction  and no ischemia.   PAST MEDICAL HISTORY:  Significant for:  1. A hysterectomy in the past.  2. Status post appendectomy.  3. History of coronary disease with what is described as an      angioplasty 15-20 years ago.   CURRENT MEDICATIONS:  Unknown since the chart has been taken to [facility].   She has no known drug allergies.   SOCIAL HISTORY:  She is married and has children.  She works in  Audiological scientist.  She has never smoked.   FAMILY HISTORY:  Remarkable only for her son having  asthma, otherwise  noncontributory.   REVIEW OF SYSTEMS:  As per history of present illness, also see patient  intake inform documented in the chart.   PHYSICAL EXAM:  GENERAL:  The patient is an overweight female in no  acute distress.  Blood pressure 164/94.  Pulse 52.  Temperature is 98.  Weight is 162  pounds. O2 saturation on room air is 99%.  HEENT:  Pupils equal round and reactive to light and accommodation.  Extraocular muscles are intact.  Nares show mild septal deviation of the  left.  Oropharynx is clear.  NECK:  Supple without JVD or lymphadenopathy.  There is no palpable  thyromegaly.  CHEST:  Totally clear to auscultation.  CARDIAC:  Reveals regular rate and rhythm with a questionable murmur  heard distantly.  ABDOMEN:  Soft, nontender with good bowel sounds.  GENITALIA, RECTAL, BREASTS:  Not done and not indicated.  Lower extremities are without edema.  Good pulses distally.  No calf  tenderness.  Neurologically, alert and oriented with no obvious __________ motor  defects.   IMPRESSION:  1. Dyspnea of unknown etiology.  There is really nothing to suggest a      pulmonary etiology for this, and her lungs are clear by      auscultation.  I think to be complete, we need to do chest x-ray as      well as full PFTs.  2. Atypical chest pain of unknown etiology.  It is unclear if this is      even related to her dyspnea.   PLAN:  1. Schedule for chest x-ray as well as full PFTs.  2. The patient will followup after the above.     Barbaraann Share, MD,FCCP  Electronically Signed    [facility]  DD: 06/29/2006  DT: 06/29/2006  Job #: [id number]   cc:   Titus Dubin. Alwyn Ren, MD,FACP,FCCP

## 2010-12-03 NOTE — Assessment & Plan Note (Signed)
[facility]                             PULMONARY OFFICE NOTE   [name], [name]                         MRN:          [id number]  DATE:08/03/2006                            DOB:          16-Aug-1950    PULMONARY FOLLOWUP   SUBJECTIVE:  [name] comes in today for followup of her various  pulmonary tests for her dyspnea.  She underwent full pulmonary function  testing, which showed no evidence of obstructive disease.  No evidence  of restriction.  Minimal decrease in DLCO that corrected to normal with  alveolar volume adjustment.  The patient had a totally clear chest x-ray  and also underwent VQ scanning for completeness that showed no evidence  of acute or chronic thromboembolic disease.  The patient states that,  since her last visit, her breathing has actually gotten a little bit  better.  Currently, she has minimal symptoms.   PHYSICAL EXAM:  BP 120/86, pulse is 56, temperature 98, weight 163  pounds.  O2 saturation on room air is 100%.   IMPRESSION:  History of dyspnea on exertion of unknown etiology.  We  have looked at her pulmonary status fairly closely and have excluded  restricted and obstructive lung disease as well as thromboembolic  disease.  The only thing that she has not been evaluated for is possible  occult pulmonary hypertension.  At this point in time, since she is  better, I would not pursue this.   PLAN:  1. I have asked the patient to work aggressively on weight loss and      also conditioning programs.  2. If her shortness of breath returns, I would recommend      echocardiogram for screening for pulmonary hypertension.  The      patient will follow up on a p.r.n. basis.     Anna Share, MD,FCCP  Electronically Signed    [facility]  DD: 08/03/2006  DT: 08/03/2006  Job #: [id number]   cc:   Anna Dubin. Alwyn Ren, MD,FACP,FCCP

## 2010-12-03 NOTE — Letter (Signed)
May 15, 2006    Casimiro Needle B. Sherene Sires, MD, FCCP  [address]  [place], [place] [address]   RE:  [name], [name]  MRN:  [id number]  /  DOB:  05/15/1951   Dear Anna [name]:   Thanks for seeing Anna [name] in consultation.  She has had intermittent atypical  chest pain since 2003.  In fact, was hospitalized at that time.  Now, she  presents with one episode of hemoptysis on May 06, 2006, which resolved  spontaneously.  She actually coughed up blood on three separate episodes,  initially 2 tablespoons followed by 1 teaspoon the next two episodes.  She  denied any concomitant chest pain or shortness of breath.   Nuclear stress test March 17, 2006, was negative although she had chest  pain and shortness of breath during this study.  A CT angiogram was  completed February 10, 2006, and showed a tiny focal area of atelectasis or  infiltrate in the left lung base in the anterior segment.   Perhaps she has some atypical process such as intrabronchial carcinoid.  I  appreciate your evaluation and recommendations.    Sincerely,      Titus Dubin. Alwyn Ren, MD,FACP,FCCP    [facility]/Med[facility]  DD: 05/15/2006  DT: 05/16/2006  Job #: [id number]

## 2010-12-03 NOTE — H&P (Signed)
[facility]  Patient:    Anna [name], Anna [name] Visit Number: [id number] MRN: [id number]          Service Type: MED Location: 3W [phone] Attending Physician:  Dolores Patty Dictated by:   Titus Dubin. Alwyn Ren, M.D. [facility] Admit Date:  11/20/2001 Discharge Date: 11/21/2001                           History and Physical  HISTORY OF PRESENT ILLNESS:  The patient is a 60 year old African-American female who presented with respiratory tract infection present for 1-1/2 weeks, associated with exertional chest pain.  The symptoms began approximately 1-1/2 weeks ago as sinus pressure and postnasal drainage.  They progressed to sore throat and cough associated with fever and chills.  She has had a cough which was nonproductive and denies any significant purulent secretions from her head.  For the last three days she has had some intermittent anterior substernal chest pain while walking but also at rest at times.  This has been associated with sweating but no nausea.  As of last week she was exercising three times per week in a gym, walking on a treadmill for 30-35 minutes without cardiopulmonary symptoms.  PAST MEDICAL HISTORY: 1. Hysterectomy. 2. Appendectomy. 3. Two pregnancies, one delivery.  FAMILY HISTORY:  Positive for colon cancer in her father, hypertension in her mother, stroke in her aunt.  A cousin may have had myocardial infarction.  SOCIAL HISTORY:  She has never smoked.  ALLERGIES:  No known drug allergies.  MEDICATIONS:  None at this time.  REVIEW OF SYSTEMS:  Generally covered above.  She did have some blood on the toilet tissue today only.  She feels she may be perimenopausal manifested by flashes.  PHYSICAL EXAMINATION:  GENERAL:  Appears to be in no acute distress.  VITAL SIGNS:  Weight 146.  Temperature 98.9, respiratory rate 17, pulse 16 and regular, blood pressure 102/80.  HEENT:  Mild erythema of the sclerae.  Arteriolar  narrowing is present.  There is mild mild erythema of the ______ and oropharynx.  Otolaryngologic exam was otherwise unremarkable.  There is full extraocular motion.  NECK:  Thyroid is normal to palpation.  HEART:  No rub.  Accentuation of the first heart sound.  No murmurs or bruits, and all pulses are intact.  Homans sign is negative.  LUNGS:  No rales.  CHEST:  Intermittent dry cough.  ABDOMEN:  No masses or tenderness.  BREASTS, PELVIC, RECTAL:  Deferred as do not pertain to this admission.  EXTREMITIES:  Full range of motion, with no significant joint or muscular findings.  SKIN:  Warm and dry.  Deep tendon reflexes are normal.  NEUROLOGIC:  No obvious neuropsychiatric deficits.  LABORATORY DATA:  EKG reveals nonspecific ST and T-wave changes inferolaterally.  There are no old EKGs for comparison.  IMPRESSION:  She appears to have an atypical bronchitis, community-acquired. Unfortunately, she has atypical chest pain which is suggestive of possible angina.  There is nothing on the EKG to suggest pericarditis.  PLAN:  She will be admitted with antibiotic coverage with telemetry monitoring and cardiac enzymes.  Once an acute event is ruled out, she could be scheduled for a stress Cardiolite as an outpatient.  Additionally, she will need colonoscopy in view of the family history of colon cancer, her age, and the rectal bleeding x 1. Dictated by:   Titus Dubin. Alwyn Ren, M.D. [facility] Attending Physician:  Dolores Patty  DD:  11/21/01 TD:  11/22/01 Job: [id number]

## 2011-02-01 ENCOUNTER — Telehealth: Payer: Self-pay | Admitting: *Deleted

## 2011-02-01 NOTE — Telephone Encounter (Signed)
V. 70.0 labs plus HPV 2, HIV, and RPR if patient desires these or if they're required as per  the   form

## 2011-02-01 NOTE — Telephone Encounter (Signed)
Pt left message that she would like to discuss some labs she would like done. Called pt for more info, Left message on voicemail to call the office.

## 2011-02-01 NOTE — Telephone Encounter (Signed)
Pt wanted to inquire about STD testing. Pt was advised that this requires ov. Pt also notes that she needs a form completed with lab results such as cholesterol, etc. Pt was advised to schedule physical. That is information that has not been done at this office and we will not be able to complete the form without physical. Pt will speak with scheduling today for appt.

## 2011-02-02 ENCOUNTER — Other Ambulatory Visit (HOSPITAL_COMMUNITY): Payer: Self-pay | Admitting: Obstetrics and Gynecology

## 2011-02-02 ENCOUNTER — Encounter: Payer: Self-pay | Admitting: Internal Medicine

## 2011-02-02 ENCOUNTER — Ambulatory Visit (INDEPENDENT_AMBULATORY_CARE_PROVIDER_SITE_OTHER): Payer: BC Managed Care – PPO | Admitting: Internal Medicine

## 2011-02-02 VITALS — BP 124/86 | HR 53 | Temp 98.3°F | Ht 62.25 in | Wt 156.0 lb

## 2011-02-02 DIAGNOSIS — I10 Essential (primary) hypertension: Secondary | ICD-10-CM

## 2011-02-02 DIAGNOSIS — Z0000 Encounter for general adult medical examination without abnormal findings: Secondary | ICD-10-CM

## 2011-02-02 DIAGNOSIS — Q639 Congenital malformation of kidney, unspecified: Secondary | ICD-10-CM | POA: Insufficient documentation

## 2011-02-02 DIAGNOSIS — R209 Unspecified disturbances of skin sensation: Secondary | ICD-10-CM

## 2011-02-02 DIAGNOSIS — Z1231 Encounter for screening mammogram for malignant neoplasm of breast: Secondary | ICD-10-CM

## 2011-02-02 DIAGNOSIS — R202 Paresthesia of skin: Secondary | ICD-10-CM

## 2011-02-02 MED ORDER — AMLODIPINE BESYLATE 5 MG PO TABS: 5.0000 mg | ORAL_TABLET | Freq: Every day | ORAL | Status: DC

## 2011-02-02 MED ORDER — LOSARTAN POTASSIUM 100 MG PO TABS: 100.0000 mg | ORAL_TABLET | Freq: Every day | ORAL | Status: DC

## 2011-02-02 MED ORDER — GABAPENTIN 100 MG PO CAPS: 100.0000 mg | ORAL_CAPSULE | Freq: Three times a day (TID) | ORAL | Status: AC

## 2011-02-02 NOTE — Progress Notes (Signed)
Subjective:    Patient ID: Anna [name], female    DOB: 04-Nov-1950, 60 y.o.   MRN: [id number]  HPI  Anna [name]  is here for a physical;acute issues include some  affected breathing with neck flexion.       Review of Systems Patient reports no vision/ hearing  changes, adenopathy,fever, weight change,  persistant / recurrent hoarseness , swallowing issues, chest pain,palpitations,edema,persistant /recurrent cough, hemoptysis, dyspnea( rest/ exertional/paroxysmal nocturnal), gastrointestinal bleeding(melena, rectal bleeding), abdominal pain, significant heartburn,  bowel changes,GU symptoms(dysuria, hematuria,pyuria, incontinence ), Gyn symptoms(abnormal  bleeding , pain),  syncope, focal weakness, memory loss,numbness & tingling, skin/hair /nail changes,abnormal bruising or bleeding, anxiety,or depression.   She has had chronic left upper quadrant/inferior thoracic pain. Extensive evaluation has been negative to date.  Approximately  10 days ago she felt a sharp pain in the left upper back w/o neck injury or pain . Since that time she's had some tingling type sensation in this area. She has a past medical history of herniated cervical disc     Objective:   Physical Exam Gen.: Healthy and well-nourished in appearance. Alert, appropriate and cooperative throughout exam. Head: Normocephalic without obvious abnormalities Eyes: No corneal or conjunctival inflammation noted. Pupils equal round reactive to light and accommodation. Fundal exam is benign without hemorrhages, exudate, papilledema. Extraocular motion intact. Vision grossly normal. Ears: External  ear exam reveals no significant lesions or deformities. Canals clear .TMs normal. Hearing is grossly normal bilaterally. Nose: External nasal exam reveals no deformity or inflammation. Nasal mucosa are pink and moist. No lesions or exudates noted. Septum : normal Mouth: Oral mucosa and oropharynx reveal no lesions or exudates. Teeth in good repair.    Neck: No deformities, masses, or tenderness noted. Range of motion & . Thyroid normal;she has no thyroidomegaly. With her neck flexed there is no change in her voice. Lungs: Normal respiratory effort; chest expands symmetrically. Lungs are clear to auscultation without rales, wheezes, or increased work of breathing. With neck flexed fully , no upper airway obstruction can be appreciated with hyperventilation.  Heart: Normal rate and rhythm. Normal S1 and S2. No gallop, click, or rub. No murmur. Abdomen: Bowel sounds normal; abdomen soft and nontender. No masses, organomegaly or hernias noted. Genitalia: Dr Cherly Hensen   .                                                                                   Musculoskeletal/extremities: No deformity or scoliosis noted of  the thoracic or lumbar spine. No clubbing, cyanosis, edema, or deformity noted. Range of motion  normal .Tone & strength  normal.Joints normal. Nail health  good. Vascular: Carotid, radial artery, dorsalis pedis and  posterior tibial pulses are full and equal. No bruits present. Neurologic: Alert and oriented x3. Deep tendon reflexes symmetrical and normal.          Skin: Intact without suspicious lesions or rashes. Lymph: No cervical, axillary  lymphadenopathy present. Psych: Mood and affect are normal. Normally interactive  Assessment & Plan:  #1 comprehensive physical exam; no acute findings #2 see Problem List with Assessments & Recommendations #3 she describes some impact on breathing when her neck is fully flexed; clinically I can find no evidence of thyroid abnormality  or airway compromise (upper airway obstruction) #4 subjective paresthesias left upper back; possible C7 radiculopathy Plan: see Orders

## 2011-02-02 NOTE — Patient Instructions (Addendum)
Preventive Health Care: Exercise  30-45  minutes a day, 3-4 days a week. Walking is especially valuable in preventing Osteoporosis. Eat a low-fat diet with lots of fruits and vegetables, up to 7-9 servings per day. Consume less than 30 grams of sugar per day from foods & drinks with High Fructose Corn Syrup as #1,2,3 or #4 on label. Health Care Power of Attorney & Living Will place you in charge of your health care  decisions. Verify these are  in place.  If the breathing symptoms persist or progress;the most appropriate step would be evaluation by an  ENT specialist

## 2011-02-03 LAB — CBC WITH DIFFERENTIAL/PLATELET
Eosinophils Relative: 3.2 % (ref 0.0–5.0)
HCT: 42.8 % (ref 36.0–46.0)
Hemoglobin: 14.3 g/dL (ref 12.0–15.0)
Lymphs Abs: 2.6 10*3/uL (ref 0.7–4.0)
Monocytes Relative: 2 % — ABNORMAL LOW (ref 3.0–12.0)
Neutro Abs: 0.3 10*3/uL — ABNORMAL LOW (ref 1.4–7.7)
WBC: 3.2 10*3/uL — ABNORMAL LOW (ref 4.5–10.5)

## 2011-02-03 LAB — GC/CHLAMYDIA PROBE AMP, URINE: GC Probe Amp, Urine: NEGATIVE

## 2011-02-03 LAB — HEPATIC FUNCTION PANEL
Bilirubin, Direct: 0.2 mg/dL (ref 0.0–0.3)
Total Bilirubin: 0.8 mg/dL (ref 0.3–1.2)
Total Protein: 8.3 g/dL (ref 6.0–8.3)

## 2011-02-03 LAB — BASIC METABOLIC PANEL
GFR: 98.92 mL/min (ref >60.00)
Potassium: 3.9 mEq/L (ref 3.5–5.1)
Sodium: 146 mEq/L — ABNORMAL HIGH (ref 135–145)

## 2011-02-03 LAB — LDL CHOLESTEROL, DIRECT: Direct LDL: 139.8 mg/dL

## 2011-02-03 LAB — RPR

## 2011-02-03 LAB — HSV 2 ANTIBODY, IGG: HSV 2 Glycoprotein G Ab, IgG: 14.37 IV — ABNORMAL HIGH

## 2011-02-03 LAB — LIPID PANEL
Total CHOL/HDL Ratio: 2
VLDL: 15.8 mg/dL (ref 0.0–40.0)

## 2011-02-03 NOTE — Telephone Encounter (Signed)
Patient was seen for a CPX on 02/02/2011

## 2011-02-08 ENCOUNTER — Encounter: Payer: BC Managed Care – PPO | Admitting: Internal Medicine

## 2011-02-09 ENCOUNTER — Ambulatory Visit (INDEPENDENT_AMBULATORY_CARE_PROVIDER_SITE_OTHER): Payer: BC Managed Care – PPO | Admitting: Internal Medicine

## 2011-02-09 DIAGNOSIS — B009 Herpesviral infection, unspecified: Secondary | ICD-10-CM

## 2011-02-09 DIAGNOSIS — D72819 Decreased white blood cell count, unspecified: Secondary | ICD-10-CM

## 2011-02-09 DIAGNOSIS — Z1159 Encounter for screening for other viral diseases: Secondary | ICD-10-CM | POA: Insufficient documentation

## 2011-02-09 NOTE — Progress Notes (Signed)
  Subjective:    Patient ID: Anna [name], female    DOB: [birth year], 60 y.o.   MRN: [id number]  HPI Aneri is here to discuss the lab results done 02/03/2011.She has minimal elevation of the AST and ALT. Reviewing labs over the past year this has remained essentially stable.  She denies an increased ingestion of vitamin A, Tylenol, or alcohol.  She has no symptoms of recurrent abdominal pain, chalky stool or coke colored urine.  She does have a history of cholelithiasis but there's been no evidence of acute flares.  Her white blood count was low with an increase in lymphocytes suggesting recent viral infection. This is not a consistent pattern based on review of labs. She has no symptoms of respiratory infection or viral  illness at this time.  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 She is asymptomatic and has had no history of dental herpes. She knows of no exposures. She has discussed this with her gynecologist; at this time there appears to be no indication for prophylaxis or active treatment. The other STD studies were negative or normal.    Review of Systems     Objective:   Physical Exam she appears healthy and well-nourished.  She has no scleral icterus.  She has no lymphadenopathy about the head neck or axilla.  Abdomen is soft and nontender. She has no organomegaly.  There is no evidence of jaundice.          Assessment & Plan:  #1 minimal elevation of AST and ALT with no clinical evidence of hepatic disease/process  #2 leukopenia with increased lymphocytes, acute change from prior CBCs  #3 positive HSV 2 serology; asymptomatic with no clinical evidence of active process.  Plan: #1 monitor hepatic enzymes in 3 months. No imaging or further testing would be indicated unless there were significant sustained rise in  enzymes  #2 recheck CBC and differential in that same time span or sooner if there were any signs of infection  #3 no  treatment indicated for the positive serology; I defer to her gynecologist for long-term monitor and recommendations.

## 2011-02-09 NOTE — Patient Instructions (Signed)
Please have the CBC and differential and fasting AST and ALT repeated should you feel ill. As a precaution these can be rechecked in 3 months.

## 2011-02-11 ENCOUNTER — Ambulatory Visit (HOSPITAL_COMMUNITY)
Admission: RE | Admit: 2011-02-11 | Discharge: 2011-02-11 | Disposition: A | Discharge status: Home / Self Care | Payer: BC Managed Care – PPO | Source: Ambulatory Visit | Attending: Obstetrics and Gynecology | Admitting: Obstetrics and Gynecology

## 2011-02-11 DIAGNOSIS — Z1231 Encounter for screening mammogram for malignant neoplasm of breast: Secondary | ICD-10-CM

## 2011-03-01 ENCOUNTER — Encounter: Payer: Self-pay | Admitting: Cardiology

## 2011-06-10 ENCOUNTER — Other Ambulatory Visit: Payer: Self-pay | Admitting: Internal Medicine

## 2011-06-10 DIAGNOSIS — D7289 Other specified disorders of white blood cells: Secondary | ICD-10-CM

## 2011-06-13 ENCOUNTER — Other Ambulatory Visit (INDEPENDENT_AMBULATORY_CARE_PROVIDER_SITE_OTHER): Payer: BC Managed Care – PPO

## 2011-06-13 DIAGNOSIS — D7289 Other specified disorders of white blood cells: Secondary | ICD-10-CM

## 2011-06-13 LAB — CBC WITH DIFFERENTIAL/PLATELET
Basophils Relative: 0.6 % (ref 0.0–3.0)
Eosinophils Relative: 5.2 % — ABNORMAL HIGH (ref 0.0–5.0)
HCT: 41 % (ref 36.0–46.0)
Lymphocytes Relative: 50.4 % — ABNORMAL HIGH (ref 12.0–46.0)
MCHC: 34 g/dL (ref 30.0–36.0)
Monocytes Absolute: 0.4 10*3/uL (ref 0.1–1.0)
Neutro Abs: 2.3 10*3/uL (ref 1.4–7.7)
Neutrophils Relative %: 36.8 % — ABNORMAL LOW (ref 43.0–77.0)
Platelets: 210 10*3/uL (ref 150.0–400.0)
RDW: 13.8 % (ref 11.5–14.6)

## 2011-06-13 NOTE — Progress Notes (Signed)
12  

## 2011-06-21 ENCOUNTER — Encounter: Payer: Self-pay | Admitting: Internal Medicine

## 2011-06-21 ENCOUNTER — Ambulatory Visit (INDEPENDENT_AMBULATORY_CARE_PROVIDER_SITE_OTHER): Payer: BC Managed Care – PPO | Admitting: Internal Medicine

## 2011-06-21 DIAGNOSIS — I10 Essential (primary) hypertension: Secondary | ICD-10-CM

## 2011-06-21 DIAGNOSIS — J209 Acute bronchitis, unspecified: Secondary | ICD-10-CM

## 2011-06-21 DIAGNOSIS — J069 Acute upper respiratory infection, unspecified: Secondary | ICD-10-CM

## 2011-06-21 LAB — AST: AST: 39 U/L — ABNORMAL HIGH (ref 0–37)

## 2011-06-21 MED ORDER — AZITHROMYCIN 250 MG PO TABS: ORAL_TABLET | ORAL | Status: AC

## 2011-06-21 MED ORDER — HYDROCODONE-HOMATROPINE 5-1.5 MG/5ML PO SYRP: 5.0000 mL | ORAL_SOLUTION | Freq: Four times a day (QID) | ORAL | Status: AC | PRN

## 2011-06-21 NOTE — Progress Notes (Signed)
  Subjective:    Patient ID: Anna [name], female    DOB: 1951/07/03, 60 y.o.   MRN: [id number]  HPI Respiratory tract infection Onset/symptoms:2& 1/2 weeks ago as ST, sneezing, rhinitis Exposures (illness/environmental/extrinsic):?ill co- workers Progression of symptoms:to chest Treatments/response:Coricidin HP with some benefit; fluids; Neti pot Present symptoms: Fever/chills/sweats:no Frontal headache:no Facial pain:no Nasal purulence:scant yellow Sore throat:yes Dental pain:no Lymphadenopathy:no Wheezing/shortness of breath:some nocturnal wheezing Cough/sputum/hemoptysis:scant yellow Past medical history: Seasonal allergies: no /asthma:no Smoking history:never           Review of Systems     Objective:   Physical Exam General appearance is of good health and nourishment; no acute distress or increased work of breathing is present.  No  lymphadenopathy about the head, neck, or axilla noted.   Eyes: No conjunctival inflammation or lid edema is present.  Ears:  External ear exam shows no significant lesions or deformities.  Otoscopic examination reveals clear canals, tympanic membranes are intact bilaterally without bulging, retraction, inflammation or discharge.  Nose:  External nasal examination shows no deformity or inflammation. Nasal mucosa are dry without lesions or exudates. No septal dislocation .No obstruction to airflow.   Oral exam: Dental hygiene is good; lips and gums are healthy appearing.There is minimal oropharyngeal erythema w/o  exudate noted.   Heart:  Normal rate and regular rhythm. S1  normal without gallop, murmur, click, rub.S2 accentuated.S4  Lungs:Chest clear to auscultation; no wheezes, rhonchi,rales ,or rubs present.No increased work of breathing.    Extremities:  No cyanosis, edema, or clubbing  noted    Skin: Warm & dry           Assessment & Plan:  #1 bronchitis; subjective nocturnal bronchospasm. Clinically no reactive airways  disease present at this time  #2 upper respiratory infection without definitive criteria for rhinosinusitis. The pharyngitis is not significant.  Plan: See orders and recommendations.

## 2011-06-21 NOTE — Patient Instructions (Signed)
Plain Mucinex for thick secretions ;force NON dairy fluids. Use a Neti pot daily as needed for sinus congestion.  Blood Pressure Goal  Ideally is an AVERAGE < 135/85. This AVERAGE should be calculated from @ least 5-7 BP readings taken @ different times of day on different days of week. You should not respond to isolated BP readings , but rather the AVERAGE for that week . Call if BP not @ goal. Please avoid decongestants as these can raise blood pressure.

## 2011-06-21 NOTE — Progress Notes (Signed)
Addended byMarga Melnick F on: 06/21/2011 01:15 PM   Modules accepted: Orders

## 2011-08-23 ENCOUNTER — Encounter: Payer: Self-pay | Admitting: Internal Medicine

## 2011-08-23 ENCOUNTER — Ambulatory Visit (INDEPENDENT_AMBULATORY_CARE_PROVIDER_SITE_OTHER): Payer: BC Managed Care – PPO | Admitting: Internal Medicine

## 2011-08-23 VITALS — BP 128/82 | HR 52 | Temp 98.5°F | Wt 159.8 lb

## 2011-08-23 DIAGNOSIS — G545 Neuralgic amyotrophy: Secondary | ICD-10-CM

## 2011-08-23 DIAGNOSIS — R82998 Other abnormal findings in urine: Secondary | ICD-10-CM

## 2011-08-23 DIAGNOSIS — R8290 Unspecified abnormal findings in urine: Secondary | ICD-10-CM

## 2011-08-23 LAB — POCT URINALYSIS DIPSTICK
Bilirubin, UA: NEGATIVE
Glucose, UA: NEGATIVE
Ketones, UA: NEGATIVE
Leukocytes, UA: NEGATIVE
Nitrite, UA: NEGATIVE

## 2011-08-23 MED ORDER — TRAMADOL HCL 50 MG PO TABS: 50.0000 mg | ORAL_TABLET | Freq: Four times a day (QID) | ORAL | Status: AC | PRN

## 2011-08-23 NOTE — Progress Notes (Signed)
Addended by: Legrand Como on: 08/23/2011 02:46 PM   Modules accepted: Orders

## 2011-08-23 NOTE — Progress Notes (Signed)
  Subjective:    Patient ID: Anna [name], female    DOB: 1951/05/10, 61 y.o.   MRN: [id number]  HPI Extremity pain Location:R shoulder to R hand Onset:2 weeks ago Trigger/injury:no Pain quality:aching Pain severity:up to 10 @ times Duration:several hours Radiation:encompasses entire RUE w/o radiating character Exacerbating factors:supine Treatment/response:heating pad, Celebrex, NSAIDS w/o benefit  She believes she may have had intra-articular steroid injections of her hips remotely. She has a history of herniated cervical disc  Her mother had osteoarthritis     Review of Systems Constitutional: no fever, chills, sweats, change in weight  Musculoskeletal:no  muscle cramps or pain; no  joint stiffness, redness, or swelling Skin:no rash, color change Neuro: Some weakness;no  incontinence (stool/urine);no  numbness and tingling Heme:no lymphadenopathy; abnormal bruising or bleeding   Her urine has an odor  if she does not drink increased amounts of fluids. She denies dysuria, hematuria, or pyuria   ]    Objective:   Physical Exam Gen.: Healthy and well-nourished in appearance. Alert, appropriate and cooperative throughout exam. Head: Normocephalic without obvious abnormalities; compression of the cervical spine does not induce symptoms Eyes: No corneal or conjunctival inflammation noted.  Neck: No deformities, masses, or tenderness noted. Range of motion normal w/o symptoms. Thyroid normal.                                                            Musculoskeletal/extremities: No deformity or scoliosis noted of  the thoracic or lumbar spine. No clubbing, cyanosis, edema, or deformity noted. Range of motion decreased @R  shoulder due to pain. Tone & strength  normal.Joints normal. Nail health  good. With arms extended laterally she has  Pain to opposition at the shoulder and humeral area. She has pain with hyperextension of the shoulder superiorly. Vascular: Carotid &radial artery  pulses are full and equal. No bruits present. Neurologic: Alert and oriented x3. Deep tendon reflexes symmetrical and normal. No neuromuscular deficit is elicited     Skin: Intact without suspicious lesions or rashes. Lymph: No cervical, axillary lymphadenopathy present. Psych: Mood and affect are normal. Normally interactive                                                                                         Assessment & Plan:  #1 shoulder pain; rotator cuff versus tendon etiology. She also describes radiation of the pain in the forearm and hand. This could not be  initiated today.. There is full range of motion of the neck without initiation of symptoms suggesting this is not related to the cervical disc problem.

## 2011-08-23 NOTE — Patient Instructions (Addendum)
Consider glucosamine sulfate 1500 mg daily for joint symptoms until seen by Orthopedist. This will rehydrate the connective  Tissues.

## 2011-08-24 ENCOUNTER — Telehealth: Payer: Self-pay | Admitting: *Deleted

## 2011-08-24 NOTE — Telephone Encounter (Signed)
The urinalysis showed no white cells or nitrites which is expected with infection.Infection is not likely. We should wait for culture and sensitivity

## 2011-08-24 NOTE — Telephone Encounter (Signed)
Discuss with patient  

## 2011-08-24 NOTE — Telephone Encounter (Signed)
Pt called wanted to know result of urine test. Advise Pt that there was blood in urine so it was sent for culture and those results are pending. .Please advise if you would like to Rx med or wait for culture results.

## 2011-08-25 LAB — URINE CULTURE
Colony Count: NO GROWTH
Organism ID, Bacteria: NO GROWTH

## 2011-09-13 ENCOUNTER — Telehealth: Payer: Self-pay | Admitting: *Deleted

## 2011-09-13 NOTE — Telephone Encounter (Signed)
[facility] Triage Call Report Triage Record Num: [id number] Operator: Arline Asp Loftin Patient Name: Anna [name] Call Date & Time: 09/13/2011 10:34:25AM Patient Phone: [phone] PCP: Marga Melnick Patient Gender: Female PCP Fax : [phone] Patient DOB: [birth year] Practice Name: [facility] Reason for Call: Caller: [name]/Patient; PCP: Marga Melnick; CB#: [phone]; Call regarding Cough/Congestion; congestion, cough, runny nose onset 02/22. Has been drinking herbal teas, Ibuprofen. Afebrile. Emergent sx negative. Care advice per "Upper Respiratory Infection" due to "new onset of 2 or more of sx of URI, nasal congestion, runny nose." Call back parameters reviewed. Protocol(s) Used: Upper Respiratory Infection (URI) Recommended Outcome per Protocol: Provide Home/Self Care Reason for Outcome: New onset of two or more of the following symptoms: nasal congestion with runny nose; sneezing; itchy or mild sore throat; mild headache or body aches; mild fatigue; low grade fever up to 101.5 F (38.6C) usually lasting about a week Care Advice: ~ Use a cool mist humidifier to moisten air. Be sure to clean according to manufacturer's instructions. ~ Call provider if symptoms worsen or new symptoms develop. ~ Consider use of a saline nasal spray per package directions to help relieve nasal congestion. A warm, moist compress placed on face, over eyes for 15 to 20 minutes, 5 to 6 times a day, may help relieve the congestion. ~ Most adults need to drink 6-10 eight-ounce glasses (1.2-2.0 liters) of fluids per day unless previously told to limit fluid intake for other medical reasons. Limit fluids that contain caffeine, sugar or alcohol. Urine will be a very light yellow color when you drink enough fluids. ~ Sore Throat Relief: - Use warm salt water gargles 3 to 4 times/day, as needed (1/2 tsp. salt in 8 oz. [.2 liters] water). - Suck on hard candy, nonprescription or herbal  throat lozenges (sugar-free if diabetic) - Eat soothing, soft food/fluids (broths, soups, or honey and lemon juice in hot tea, Popsicles, frozen yogurt or sherbet, scrambled eggs, cooked cereals, Jell-O or puddings) whichever is most comforting. - Avoid eating salty, spicy or acidic foods. ~ ~ Rest until symptoms improve. If more than [redacted] weeks pregnant, lie on left side when resting.

## 2011-09-14 NOTE — Telephone Encounter (Signed)
Offer OV today if desired

## 2011-09-14 NOTE — Telephone Encounter (Signed)
Called patient back to follow up on her.  LMVM for patient to call back.

## 2011-09-14 NOTE — Telephone Encounter (Signed)
Noted, will forward to Dr.Hopper as an [place]

## 2011-09-16 NOTE — Telephone Encounter (Signed)
Called patient again and left voicemail to see how she was feeling.  If not any better, Dr. Alwyn Ren recommended her being seen in the office.  I instructed her that he is out of the office for the next week, but one of the other physicians could see her.  Advised patient to call the office with any questions.

## 2012-02-02 ENCOUNTER — Other Ambulatory Visit (HOSPITAL_COMMUNITY): Payer: Self-pay | Admitting: Obstetrics and Gynecology

## 2012-02-02 DIAGNOSIS — Z1231 Encounter for screening mammogram for malignant neoplasm of breast: Secondary | ICD-10-CM

## 2012-02-21 ENCOUNTER — Ambulatory Visit (HOSPITAL_COMMUNITY)
Admission: RE | Admit: 2012-02-21 | Discharge: 2012-02-21 | Disposition: A | Discharge status: Home / Self Care | Payer: BC Managed Care – PPO | Source: Ambulatory Visit | Attending: Obstetrics and Gynecology | Admitting: Obstetrics and Gynecology

## 2012-02-21 DIAGNOSIS — Z1231 Encounter for screening mammogram for malignant neoplasm of breast: Secondary | ICD-10-CM

## 2012-02-27 ENCOUNTER — Ambulatory Visit (INDEPENDENT_AMBULATORY_CARE_PROVIDER_SITE_OTHER): Payer: BC Managed Care – PPO | Admitting: Internal Medicine

## 2012-02-27 ENCOUNTER — Encounter: Payer: Self-pay | Admitting: Internal Medicine

## 2012-02-27 ENCOUNTER — Ambulatory Visit (HOSPITAL_BASED_OUTPATIENT_CLINIC_OR_DEPARTMENT_OTHER)
Admission: RE | Admit: 2012-02-27 | Discharge: 2012-02-27 | Disposition: A | Discharge status: Home / Self Care | Payer: BC Managed Care – PPO | Source: Ambulatory Visit | Attending: Internal Medicine | Admitting: Internal Medicine

## 2012-02-27 VITALS — BP 120/82 | HR 53 | Temp 98.8°F | Ht 62.03 in | Wt 157.4 lb

## 2012-02-27 DIAGNOSIS — M79671 Pain in right foot: Secondary | ICD-10-CM

## 2012-02-27 DIAGNOSIS — M79609 Pain in unspecified limb: Secondary | ICD-10-CM

## 2012-02-27 DIAGNOSIS — T887XXA Unspecified adverse effect of drug or medicament, initial encounter: Secondary | ICD-10-CM

## 2012-02-27 DIAGNOSIS — R7402 Elevation of levels of lactic acid dehydrogenase (LDH): Secondary | ICD-10-CM

## 2012-02-27 DIAGNOSIS — I10 Essential (primary) hypertension: Secondary | ICD-10-CM

## 2012-02-27 LAB — BASIC METABOLIC PANEL
BUN: 13 mg/dL (ref 6–23)
Chloride: 101 mEq/L (ref 96–112)
Creatinine, Ser: 0.9 mg/dL (ref 0.4–1.2)
GFR: 83.38 mL/min (ref >60.00)
Potassium: 3.5 mEq/L (ref 3.5–5.1)

## 2012-02-27 LAB — HEPATIC FUNCTION PANEL
ALT: 38 U/L — ABNORMAL HIGH (ref 0–35)
AST: 46 U/L — ABNORMAL HIGH (ref 0–37)
Alkaline Phosphatase: 53 U/L (ref 39–117)
Bilirubin, Direct: 0 mg/dL (ref 0.0–0.3)
Total Bilirubin: 0.6 mg/dL (ref 0.3–1.2)

## 2012-02-27 LAB — URIC ACID: Uric Acid, Serum: 4.8 mg/dL (ref 2.4–7.0)

## 2012-02-27 MED ORDER — TRAMADOL HCL 50 MG PO TABS: 50.0000 mg | ORAL_TABLET | Freq: Four times a day (QID) | ORAL | Status: DC | PRN

## 2012-02-27 NOTE — Addendum Note (Signed)
Addended by: Silvio Pate D on: 02/27/2012 01:43 PM   Modules accepted: Orders

## 2012-02-27 NOTE — Patient Instructions (Addendum)
Order for x-rays entered into  the computer; these will be performed at [facility]. No appointment is necessary.    Please try to go on My Chart within the next 24 hours to allow me to release the results directly to you.  

## 2012-02-27 NOTE — Progress Notes (Signed)
  Subjective:    Patient ID: Anna [name], female    DOB: [birth year], 61 y.o.   MRN: [id number]  HPI Approximately 9 AM 02/21/12 she noted pain in the dorsum of the right foot. There was no associated injury or trigger for the pain. She describes it as throbbing, nonradiating, and constant. It has failed to respond to heat, cold, Epsom salts soaks, nonsteroidal anti-inflammatory agents or Tylenol. She has noted some weakness & swelling  in the in the foot as well  In the recent past she also had some pain in the right popliteal space but this resolved spontaneously    Review of Systems Constitutional: no fever, chills, sweats, change in weight  Musculoskeletal:no  muscle cramps or pain; no  joint stiffness, redness, or swelling Skin:no rash, color change Neuro: no  incontinence (stool/urine); numbness and tingling Heme:no lymphadenopathy; abnormal bruising or bleeding        Objective:   Physical Exam  She appears healthy and well-nourished in no acute distress  Strength, tone, and deep tendon reflexes are normal the lower extremities  Pedal pulses are intact  There is visible swelling of the dorsum of the right foot. By palpation there appears to be an exostosis on the medial dorsal foot. There is no discoloration or change in temperature; but there is marked tenderness at the base of the second and third toes over the dorsum of the foot        Assessment & Plan:  #1 pain right foot; rule out metatarsal fracture.  Plan: See orders and recommendations

## 2012-02-28 ENCOUNTER — Telehealth: Payer: Self-pay

## 2012-02-28 DIAGNOSIS — M79673 Pain in unspecified foot: Secondary | ICD-10-CM

## 2012-02-28 MED ORDER — CELECOXIB 200 MG PO CAPS: 200.0000 mg | ORAL_CAPSULE | Freq: Two times a day (BID) | ORAL | Status: DC

## 2012-02-28 NOTE — Addendum Note (Signed)
Addended by: Maurice Small on: 02/28/2012 05:17 PM   Modules accepted: Orders

## 2012-02-28 NOTE — Telephone Encounter (Addendum)
I spoke with patient and informed her of Dr.Hopper response to labs. Patient states she is ok with referral to [facility] but in the mean time what can she do, patient is unable to tolerate Tramadol (causes Severe Headache)

## 2012-02-28 NOTE — Progress Notes (Signed)
Quick Note:  To prevent gout the minimal uric acid goal is < 7; preferred is < 6, ideally < 5 to prevent gout. Gout would be extremely unlikely with the uric acid this low. Mild elevation of liver enzyme tests; avoid excess Tylenol, alcohol & vitamin A.Recheck fasting AST/ALT in 3-4 months.PLEASE BRING THESE INSTRUCTIONS TO FOLLOW UP LAB APPOINTMENT.This will guarantee correct labs are drawn, eliminating need for repeat blood sampling ( needle sticks ! ). Diagnoses /Codes: 790.4.   All other labs are excellent.Hopp  ______

## 2012-02-28 NOTE — Telephone Encounter (Signed)
See results. If you activate My Chart; the results can be released to you as soon as they populate from the lab or Xray. If you choose not to use this program; the labs have to be reviewed, copied & mailed   causing a delay in getting the results to you.

## 2012-02-28 NOTE — Telephone Encounter (Signed)
Celebrex 200 mg 1 daily to twice a day as needed dispense 30

## 2012-02-28 NOTE — Telephone Encounter (Signed)
Patient was seen yesterday for her foot, patient called requesting results of labs and Xray to see if anything would reveal her foot problem, for it is worse today.  I briefly reviewed reports: Xray was normal, gout levels were normal. Patient informed I will discuss with Dr.Hopper and contact her back with his recommendations/next step.

## 2012-02-29 NOTE — Telephone Encounter (Signed)
Pt states she is still in pain with her foot and is inquiring about ortho referral. She states she does not know what she is supposed to do.

## 2012-02-29 NOTE — Addendum Note (Signed)
Addended by: Edison Simon C on: 02/29/2012 11:00 AM   Modules accepted: Orders

## 2012-02-29 NOTE — Telephone Encounter (Signed)
I spoke with patient and she stated she has an appointment for next Tuesday with Orthro. Patient also contacted her podiatrist and has an appointment today.

## 2012-03-05 ENCOUNTER — Ambulatory Visit: Payer: BC Managed Care – PPO | Attending: Podiatry | Admitting: Physical Therapy

## 2012-03-05 DIAGNOSIS — M25673 Stiffness of unspecified ankle, not elsewhere classified: Secondary | ICD-10-CM | POA: Insufficient documentation

## 2012-03-05 DIAGNOSIS — M25676 Stiffness of unspecified foot, not elsewhere classified: Secondary | ICD-10-CM | POA: Insufficient documentation

## 2012-03-05 DIAGNOSIS — IMO0001 Reserved for inherently not codable concepts without codable children: Secondary | ICD-10-CM | POA: Insufficient documentation

## 2012-03-05 DIAGNOSIS — M25579 Pain in unspecified ankle and joints of unspecified foot: Secondary | ICD-10-CM | POA: Insufficient documentation

## 2012-03-07 ENCOUNTER — Telehealth: Payer: Self-pay | Admitting: *Deleted

## 2012-03-07 ENCOUNTER — Ambulatory Visit: Payer: BC Managed Care – PPO | Admitting: Rehabilitation

## 2012-03-07 NOTE — Telephone Encounter (Signed)
Prior Auth Approved 02-29-12 until 11-24-14, approval letter scan to chart, pharmacy faxed.

## 2012-03-12 ENCOUNTER — Ambulatory Visit: Payer: BC Managed Care – PPO | Admitting: Rehabilitation

## 2012-03-14 ENCOUNTER — Ambulatory Visit: Payer: BC Managed Care – PPO | Admitting: Rehabilitation

## 2012-03-15 ENCOUNTER — Ambulatory Visit: Payer: BC Managed Care – PPO | Admitting: Rehabilitation

## 2012-03-20 ENCOUNTER — Ambulatory Visit: Payer: BC Managed Care – PPO | Attending: Podiatry | Admitting: Rehabilitation

## 2012-03-20 DIAGNOSIS — M25673 Stiffness of unspecified ankle, not elsewhere classified: Secondary | ICD-10-CM | POA: Insufficient documentation

## 2012-03-20 DIAGNOSIS — M25579 Pain in unspecified ankle and joints of unspecified foot: Secondary | ICD-10-CM | POA: Insufficient documentation

## 2012-03-20 DIAGNOSIS — IMO0001 Reserved for inherently not codable concepts without codable children: Secondary | ICD-10-CM | POA: Insufficient documentation

## 2012-03-20 DIAGNOSIS — M25676 Stiffness of unspecified foot, not elsewhere classified: Secondary | ICD-10-CM | POA: Insufficient documentation

## 2012-03-21 ENCOUNTER — Ambulatory Visit: Payer: BC Managed Care – PPO | Admitting: Rehabilitation

## 2012-03-22 ENCOUNTER — Ambulatory Visit: Payer: BC Managed Care – PPO | Admitting: Rehabilitation

## 2012-03-26 ENCOUNTER — Ambulatory Visit: Payer: BC Managed Care – PPO | Admitting: Rehabilitation

## 2012-03-28 ENCOUNTER — Ambulatory Visit: Payer: BC Managed Care – PPO | Admitting: Rehabilitation

## 2012-03-29 ENCOUNTER — Ambulatory Visit: Payer: BC Managed Care – PPO | Admitting: Rehabilitation

## 2012-06-29 ENCOUNTER — Other Ambulatory Visit: Payer: Self-pay | Admitting: Internal Medicine

## 2012-10-14 LAB — HM PAP SMEAR

## 2013-02-08 ENCOUNTER — Encounter: Payer: Self-pay | Admitting: Internal Medicine

## 2013-02-08 ENCOUNTER — Ambulatory Visit (INDEPENDENT_AMBULATORY_CARE_PROVIDER_SITE_OTHER): Payer: BC Managed Care – PPO | Admitting: Internal Medicine

## 2013-02-08 VITALS — BP 126/82 | HR 59 | Temp 98.2°F | Resp 12 | Ht 62.5 in | Wt 158.0 lb

## 2013-02-08 DIAGNOSIS — R0600 Dyspnea, unspecified: Secondary | ICD-10-CM

## 2013-02-08 DIAGNOSIS — Z0000 Encounter for general adult medical examination without abnormal findings: Secondary | ICD-10-CM

## 2013-02-08 DIAGNOSIS — R9431 Abnormal electrocardiogram [ECG] [EKG]: Secondary | ICD-10-CM

## 2013-02-08 HISTORY — DX: Dyspnea, unspecified: R06.00

## 2013-02-08 LAB — CBC WITH DIFFERENTIAL/PLATELET
Basophils Relative: 0.5 % (ref 0.0–3.0)
HCT: 42.4 % (ref 36.0–46.0)
Hemoglobin: 14.2 g/dL (ref 12.0–15.0)
Lymphocytes Relative: 49.2 % — ABNORMAL HIGH (ref 12.0–46.0)
Lymphs Abs: 2.4 10*3/uL (ref 0.7–4.0)
Monocytes Relative: 6.7 % (ref 3.0–12.0)
Neutro Abs: 1.9 10*3/uL (ref 1.4–7.7)
RBC: 4.67 Mil/uL (ref 3.87–5.11)

## 2013-02-08 LAB — TSH: TSH: 1.14 u[IU]/mL (ref 0.35–5.50)

## 2013-02-08 LAB — HEPATIC FUNCTION PANEL
ALT: 33 U/L (ref 0–35)
AST: 34 U/L (ref 0–37)
Albumin: 4.1 g/dL (ref 3.5–5.2)
Alkaline Phosphatase: 48 U/L (ref 39–117)
Total Protein: 7.2 g/dL (ref 6.0–8.3)

## 2013-02-08 LAB — BASIC METABOLIC PANEL
CO2: 29 mEq/L (ref 19–32)
Calcium: 9.5 mg/dL (ref 8.4–10.5)
GFR: 71.83 mL/min (ref >60.00)
Glucose, Bld: 98 mg/dL (ref 70–99)
Potassium: 3.6 mEq/L (ref 3.5–5.1)
Sodium: 139 mEq/L (ref 135–145)

## 2013-02-08 LAB — LIPID PANEL
HDL: 66.9 mg/dL (ref >39.00)
Total CHOL/HDL Ratio: 3
Triglycerides: 97 mg/dL (ref 0.0–149.0)
VLDL: 19.4 mg/dL (ref 0.0–40.0)

## 2013-02-08 NOTE — Progress Notes (Signed)
  Subjective:    Patient ID: Anna [name], female    DOB: March 12, 1951, 62 y.o.   MRN: [id number]  HPI  She is here for a physical;acute issues denied     Review of Systems  Home blood pressure average 130/85  Patient is compliant with medications  No adverse effects noted from medication  Exercise program as walking & Pilates 4-5 X / week  for  30-60 minutes  No specific dietary program except low-salt diet    No chest pain, palpitations, dyspnea, claudication,edema or paroxysmal nocturnal dyspnea described  No significant lightheadedness, headache, epistaxis, or syncope        Objective:   Physical Exam Gen.: Healthy and well-nourished in appearance. Alert, appropriate and cooperative throughout exam. Appears younger than stated age  Head: Normocephalic without obvious abnormalities  Eyes: No corneal or conjunctival inflammation noted.  Extraocular motion intact. Vision grossly normal without lenses Ears: External  ear exam reveals no significant lesions or deformities. Canals clear .TMs normal. Hearing is grossly normal bilaterally. Nose: External nasal exam reveals no deformity or inflammation. Nasal mucosa are pink and moist. No lesions or exudates noted.   Mouth: Oral mucosa and oropharynx reveal no lesions or exudates. Teeth in good repair. Neck: No deformities, masses, or tenderness noted. Range of motion & Thyroid normal. Lungs: Normal respiratory effort; chest expands symmetrically. Lungs are clear to auscultation without rales, wheezes, or increased work of breathing. Heart: Normal rate and rhythm. Normal S1 and S2. No gallop, click, or rub. Extra premature.No murmur. Abdomen: Bowel sounds normal; abdomen soft and nontender. No masses, organomegaly or hernias noted. Genitalia: As per Gyn                                  Musculoskeletal/extremities: No deformity or scoliosis noted of  the thoracic or lumbar spine.  No clubbing, cyanosis, edema, or significant  extremity  deformity noted. Range of motion normal .Tone & strength  Normal. Joints normal . Nail health good. Able to lie down & sit up w/o help. Negative SLR bilaterally Vascular: Carotid, radial artery, dorsalis pedis and  posterior tibial pulses are full and equal. No bruits present. Neurologic: Alert and oriented x3. Deep tendon reflexes symmetrical and normal.        Skin: Intact without suspicious lesions or rashes. Lymph: No cervical, axillary lymphadenopathy present. Psych: Mood and affect are normal. Normally interactive                                                                                       Assessment & Plan:  #1 comprehensive physical exam; no acute findings  Plan: see Orders  & Recommendations

## 2013-02-08 NOTE — Patient Instructions (Addendum)
Minimal Blood Pressure Goal= AVERAGE < 140/90;  Ideal is an AVERAGE < 135/85. This AVERAGE should be calculated from @ least 5-7 BP readings taken @ different times of day on different days of week. You should not respond to isolated BP readings , but rather the AVERAGE for that week .Please bring your  blood pressure cuff to office visits to verify that it is reliable.It  can also be checked against the blood pressure device at the pharmacy. Finger or wrist cuffs are not dependable; an arm cuff is.Take the EKG to any emergency room or preop visits. There are nonspecific changes; as long as there is no new change these are not clinically significant . If the old EKG is not available for comparison; it may result in unnecessary hospitalization for observation with significant unnecessary expense.  If you activate the  My Chart system; lab & Xray results will be released directly  to you as soon as I review & address these through the computer. If you choose not to sign up for My Chart within 36 hours of labs being drawn; results will be reviewed & interpretation added before being copied & mailed, causing a delay in getting the results to you.If you do not receive that report within 7-10 days ,please call. Additionally you can use this system to gain direct  access to your records  if  out of town or @ an office of a  physician who is not in  the My Chart network.  This improves continuity of care & places you in control of your medical record.

## 2013-02-11 ENCOUNTER — Telehealth: Payer: Self-pay | Admitting: Internal Medicine

## 2013-02-11 NOTE — Telephone Encounter (Signed)
Hopp please advise on labs form February 08, 2013

## 2013-02-11 NOTE — Telephone Encounter (Signed)
Patient is calling in regards to her lab work results from 07.25.14.

## 2013-02-12 ENCOUNTER — Telehealth: Payer: Self-pay | Admitting: Internal Medicine

## 2013-02-12 NOTE — Telephone Encounter (Signed)
Pt called and stated that they talk with you about some paperwork she dropped off and you were handling for her. Pt would like a call back. thanks

## 2013-02-12 NOTE — Telephone Encounter (Signed)
Spoke with patient, form faxed and placed up front for patient to pick-up (for her record), copy of recent labs attached to form also.

## 2013-03-07 ENCOUNTER — Other Ambulatory Visit (HOSPITAL_BASED_OUTPATIENT_CLINIC_OR_DEPARTMENT_OTHER): Payer: Self-pay | Admitting: Obstetrics and Gynecology

## 2013-03-07 DIAGNOSIS — Z1231 Encounter for screening mammogram for malignant neoplasm of breast: Secondary | ICD-10-CM

## 2013-03-20 ENCOUNTER — Ambulatory Visit (HOSPITAL_BASED_OUTPATIENT_CLINIC_OR_DEPARTMENT_OTHER)
Admission: RE | Admit: 2013-03-20 | Discharge: 2013-03-20 | Disposition: A | Discharge status: Home / Self Care | Payer: BC Managed Care – PPO | Source: Ambulatory Visit | Attending: Obstetrics and Gynecology | Admitting: Obstetrics and Gynecology

## 2013-03-20 DIAGNOSIS — Z1231 Encounter for screening mammogram for malignant neoplasm of breast: Secondary | ICD-10-CM | POA: Insufficient documentation

## 2013-07-31 ENCOUNTER — Other Ambulatory Visit: Payer: Self-pay | Admitting: Internal Medicine

## 2013-07-31 NOTE — Telephone Encounter (Signed)
Amlodipine and Losartan refilled per protocol. JG//CMA 

## 2013-10-14 ENCOUNTER — Ambulatory Visit (INDEPENDENT_AMBULATORY_CARE_PROVIDER_SITE_OTHER): Payer: BC Managed Care – PPO | Admitting: Family Medicine

## 2013-10-14 ENCOUNTER — Ambulatory Visit (HOSPITAL_BASED_OUTPATIENT_CLINIC_OR_DEPARTMENT_OTHER)
Admission: RE | Admit: 2013-10-14 | Discharge: 2013-10-14 | Disposition: A | Discharge status: Home / Self Care | Payer: BC Managed Care – PPO | Source: Ambulatory Visit | Attending: Family Medicine | Admitting: Family Medicine

## 2013-10-14 ENCOUNTER — Encounter: Payer: Self-pay | Admitting: Family Medicine

## 2013-10-14 VITALS — BP 120/82 | HR 77 | Temp 98.3°F | Ht 62.3 in | Wt 162.8 lb

## 2013-10-14 DIAGNOSIS — M79609 Pain in unspecified limb: Secondary | ICD-10-CM

## 2013-10-14 DIAGNOSIS — M79642 Pain in left hand: Secondary | ICD-10-CM

## 2013-10-14 DIAGNOSIS — M79641 Pain in right hand: Secondary | ICD-10-CM

## 2013-10-14 DIAGNOSIS — M19049 Primary osteoarthritis, unspecified hand: Secondary | ICD-10-CM | POA: Insufficient documentation

## 2013-10-14 MED ORDER — MELOXICAM 15 MG PO TABS: ORAL_TABLET | ORAL | Status: DC

## 2013-10-14 NOTE — Progress Notes (Signed)
   Subjective:    Patient ID: Anna [name], female    DOB: Nov 22, 1950, 63 y.o.   MRN: [id number]  HPI Pt is here c/o pain in R thumb at base and both index fingers ---nodules are on both index fingers.  Pt works on Teaching laboratory technician all day long.  Pt tried IB with no relief.        Review of Systems As above    Objective:   Physical Exam        Assessment & Plan:   Subjective:    Anna [name] is a 63 y.o. female who presents for evaluation of bilateral hand/finger pain. Onset was several months ago. The pain is mild, worsens with movement, and is relieved by rest. There is no associated numbness, tingling, weakness in hand or wrist. Evaluation to date: none. Treatment to date: OTC analgesics.  The following portions of the patient's history were reviewed and updated as appropriate: allergies, current medications, past family history, past medical history, past social history, past surgical history and problem list.  Review of Systems Pertinent items are noted in HPI.    Objective:    BP 120/82  Pulse 77  Temp(Src) 98.3 F (36.8 C) (Oral)  Ht 5' 2.3" (1.582 m)  Wt 162 lb 12.8 oz (73.846 kg)  BMI 29.51 kg/m2  SpO2 98% Right hand:  nodules on index finger, no pain  Left hand:  nodules on index finger and pain in thumb and thenar emminence   Imaging: X-ray both hands: not available    Assessment:    dequervain tenosynovitis?  And arthritis   Plan:    Rest, ice, compression, and elevation (RICE) therapy. Plain film x-rays per orders. NSAIDs per medication orders. thumb spica for Left hand--pt will get at pharmacy  rto prn

## 2013-10-14 NOTE — Patient Instructions (Signed)
De Quervain's Tenosynovitis De Quervain's tenosynovitis involves inflammation of one or two tendon linings (sheaths) or strain of one or two tendons to the thumb: extensor pollicis brevis (EPB), or abductor pollicis longus (APL). This causes pain on the side of the wrist and base of the thumb. Tendon sheaths secrete a fluid that lubricates the tendon, allowing the tendon to move smoothly. When the sheath becomes inflamed, the tendon cannot move freely in the sheath. Both the EPB and APL tendons are important for proper use of the hand. The EPB tendon is important for straightening the thumb. The APL tendon is important for moving the thumb away from the index finger (abducting). The two tendons pass through a small tube (canal) in the wrist, near the base of the thumb. When the tendons become inflamed, pain is usually felt in this area. SYMPTOMS   Pain, tenderness, swelling, warmth, or redness over the base of the thumb and thumb side of the wrist.  Pain that gets worse when straightening the thumb.  Pain that gets worse when moving the thumb away from the index finger, against resistance.  Pain with pinching or gripping.  Locking or catching of the thumb.  Limited motion of the thumb.  Crackling sound (crepitation) when the tendon or thumb is moved or touched.  Fluid-filled cyst in the area of the base of the thumb. CAUSES   Tenosynovitis is often linked with overuse of the wrist.  Tenosynovitis may be caused by repeated injury to the thumb muscle and tendon units, and with repeated motions of the hand and wrist, due to friction of the tendon within the lining (sheath).  Tenosynovitis may also be due to a sudden increase in activity or change in activity. RISK INCREASES WITH:  Sports that involve repeated hand and wrist motions (golf, bowling, tennis, squash, racquetball).  Heavy labor.  Poor physical wrist strength and flexibility.  Failure to warm up properly before practice or  play.  Female gender.  New mothers who hold their baby's head for long periods or lift infants with thumbs in the infant's armpit (axilla). PREVENTION  Warm up and stretch properly before practice or competition.  Allow enough time for rest and recovery between practices and competition.  Maintain appropriate conditioning:  Cardiovascular fitness.  Forearm, wrist, and hand flexibility.  Muscle strength and endurance.  Use proper exercise technique. PROGNOSIS  This condition is usually curable within 6 weeks, if treated properly with non-surgical treatment and resting of the affected area.  RELATED COMPLICATIONS   Longer healing time if not properly treated or if not given enough time to heal.  Chronic inflammation, causing recurring symptoms of tenosynovitis. Permanent pain or restriction of movement.  Risks of surgery: infection, bleeding, injury to nerves (numbness of the thumb), continued pain, incomplete release of the tendon sheath, recurring symptoms, cutting of the tendons, tendons sliding out of position, weakness of the thumb, thumb stiffness. TREATMENT  First, treatment involves the use of medicine and ice, to reduce pain and inflammation. Patients are encouraged to stop or modify activities that aggravate the injury. Stretching and strengthening exercises may be advised. Exercises may be completed at home or with a therapist. You may be fitted with a brace or splint, to limit motion and allow the injury to heal. Your caregiver may also choose to give you a corticosteroid injection, to reduce the pain and inflammation. If non-surgical treatment is not successful, surgery may be needed. Most tenosynovitis surgeries are done as outpatient procedures (you go home the   may also choose to give you a corticosteroid injection, to reduce the pain and inflammation. If non-surgical treatment is not successful, surgery may be needed. Most tenosynovitis surgeries are done as outpatient procedures (you go home the same day). Surgery may involve local, regional (whole arm), or general anesthesia.   MEDICATION   · If pain medicine is needed, nonsteroidal anti-inflammatory medicines (aspirin and ibuprofen), or other minor pain  relievers (acetaminophen), are often advised.  · Do not take pain medicine for 7 days before surgery.  · Prescription pain relievers are often prescribed only after surgery. Use only as directed and only as much as you need.  · Corticosteroid injections may be given if your caregiver thinks they are needed. There is a limited number of times these injections may be given.  COLD THERAPY   · Cold treatment (icing) should be applied for 10 to 15 minutes every 2 to 3 hours for inflammation and pain, and immediately after activity that aggravates your symptoms. Use ice packs or an ice massage.  SEEK MEDICAL CARE IF:   · Symptoms get worse or do not improve in 2 to 4 weeks, despite treatment.  · You experience pain, numbness, or coldness in the hand.  · Blue, gray, or dark color appears in the fingernails.  · Any of the following occur after surgery: increased pain, swelling, redness, drainage of fluids, bleeding in the affected area, or signs of infection.  · New, unexplained symptoms develop. (Drugs used in treatment may produce side effects.)  Document Released: 07/04/2005 Document Revised: 09/26/2011 Document Reviewed: 10/16/2008  ExitCare® Patient Information ©2014 [employer].

## 2013-10-14 NOTE — Progress Notes (Signed)
Pre visit review using our clinic review tool, if applicable. No additional management support is needed unless otherwise documented below in the visit note. 

## 2013-12-24 ENCOUNTER — Ambulatory Visit (INDEPENDENT_AMBULATORY_CARE_PROVIDER_SITE_OTHER): Payer: BC Managed Care – PPO | Admitting: Family Medicine

## 2013-12-24 ENCOUNTER — Encounter: Payer: Self-pay | Admitting: Family Medicine

## 2013-12-24 VITALS — BP 124/86 | HR 68 | Temp 98.4°F | Wt 162.0 lb

## 2013-12-24 DIAGNOSIS — M654 Radial styloid tenosynovitis [de Quervain]: Secondary | ICD-10-CM

## 2013-12-24 DIAGNOSIS — M549 Dorsalgia, unspecified: Secondary | ICD-10-CM

## 2013-12-24 MED ORDER — PREDNISONE 10 MG PO TABS: ORAL_TABLET | ORAL | Status: DC

## 2013-12-24 MED ORDER — CYCLOBENZAPRINE HCL 10 MG PO TABS: 10.0000 mg | ORAL_TABLET | Freq: Three times a day (TID) | ORAL | Status: DC | PRN

## 2013-12-24 NOTE — Progress Notes (Signed)
Pre visit review using our clinic review tool, if applicable. No additional management support is needed unless otherwise documented below in the visit note. 

## 2013-12-24 NOTE — Patient Instructions (Signed)
Lumbosacral Strain Lumbosacral strain is a strain of any of the parts that make up your lumbosacral vertebrae. Your lumbosacral vertebrae are the bones that make up the lower third of your backbone. Your lumbosacral vertebrae are held together by muscles and tough, fibrous tissue (ligaments).  CAUSES  A sudden blow to your back can cause lumbosacral strain. Also, anything that causes an excessive stretch of the muscles in the low back can cause this strain. This is typically seen when people exert themselves strenuously, fall, lift heavy objects, bend, or crouch repeatedly. RISK FACTORS  Physically demanding work.  Participation in pushing or pulling sports or sports that require sudden twist of the back (tennis, golf, baseball).  Weight lifting.  Excessive lower back curvature.  Forward-tilted pelvis.  Weak back or abdominal muscles or both.  Tight hamstrings. SIGNS AND SYMPTOMS  Lumbosacral strain may cause pain in the area of your injury or pain that moves (radiates) down your leg.  DIAGNOSIS Your health care provider can often diagnose lumbosacral strain through a physical exam. In some cases, you may need tests such as X-ray exams.  TREATMENT  Treatment for your lower back injury depends on many factors that your clinician will have to evaluate. However, most treatment will include the use of anti-inflammatory medicines. HOME CARE INSTRUCTIONS   Avoid hard physical activities (tennis, racquetball, waterskiing) if you are not in proper physical condition for it. This may aggravate or create problems.  If you have a back problem, avoid sports requiring sudden body movements. Swimming and walking are generally safer activities.  Maintain good posture.  Maintain a healthy weight.  For acute conditions, you may put ice on the injured area.  Put ice in a plastic bag.  Place a towel between your skin and the bag.  Leave the ice on for 20 minutes, 2 3 times a day.  When the  low back starts healing, stretching and strengthening exercises may be recommended. SEEK MEDICAL CARE IF:  Your back pain is getting worse.  You experience severe back pain not relieved with medicines. SEEK IMMEDIATE MEDICAL CARE IF:   You have numbness, tingling, weakness, or problems with the use of your arms or legs.  There is a change in bowel or bladder control.  You have increasing pain in any area of the body, including your belly (abdomen).  You notice shortness of breath, dizziness, or feel faint.  You feel sick to your stomach (nauseous), are throwing up (vomiting), or become sweaty.  You notice discoloration of your toes or legs, or your feet get very cold. MAKE SURE YOU:   Understand these instructions.  Will watch your condition.  Will get help right away if you are not doing well or get worse. Document Released: 04/13/2005 Document Revised: 04/24/2013 Document Reviewed: 02/20/2013 Exit[employer] Patient Information 2014 [employer].  

## 2013-12-24 NOTE — Progress Notes (Signed)
  Subjective:    Anna [name] is a 63 y.o. female who presents for evaluation of low back pain. The patient has had no prior back problems. Symptoms have been present for 3 weeks and are unchanged.  Onset was related to / precipitated by no known injury. The pain is located in the across the lower back and does not radiate. The pain is described as aching and occurs intermittently. She rates her pain as a 8 on a scale of 0-10. Symptoms are exacerbated by walking. Symptoms are improved by massage. She has also tried NSAIDs which provided no symptom relief. She has no other symptoms associated with the back pain. The patient has no "red flag" history indicative of complicated back pain.  The following portions of the patient's history were reviewed and updated as appropriate: allergies, current medications, past family history, past medical history, past social history, past surgical history and problem list.  Review of Systems Pertinent items are noted in HPI.    Objective:   Full range of motion without pain, no tenderness, no spasm, no curvature. Normal reflexes, gait, strength and negative straight-leg raise.    Assessment:    Nonspecific acute low back pain    Plan:    Natural history and expected course discussed. Questions answered. Short (2-4 day) period of relative rest recommended until acute symptoms improve. Ice to affected area as needed for local pain relief. Heat to affected area as needed for local pain relief. Muscle relaxants per medication orders.

## 2013-12-25 ENCOUNTER — Telehealth: Payer: Self-pay | Admitting: Family Medicine

## 2013-12-25 DIAGNOSIS — D229 Melanocytic nevi, unspecified: Secondary | ICD-10-CM

## 2013-12-25 DIAGNOSIS — O020 Blighted ovum and nonhydatidiform mole: Secondary | ICD-10-CM

## 2013-12-25 NOTE — Telephone Encounter (Signed)
Caller name: Kaaren  Call back number: [phone]   Reason for call:  Pt is calling about referral for mole removal.  Did not see one placed.  Can we get this put in?  Pt says she needs a heads up in regards to the scheduling of the appointment due to getting off work.  Pt would like to be called as soon as possible for scheduling.

## 2013-12-26 NOTE — Telephone Encounter (Signed)
Patient called to return Kim's phone call.

## 2013-12-26 NOTE — Telephone Encounter (Signed)
i could be wrong but I thought it was a mole we were going to remove.  If shes would like derm it is ok to put referral in

## 2013-12-26 NOTE — Telephone Encounter (Signed)
She and Dr.Lowne did not discussed removing it per patient, Ref put in and the patient is aware.    KP

## 2013-12-26 NOTE — Telephone Encounter (Signed)
Please advise      KP 

## 2014-02-14 ENCOUNTER — Other Ambulatory Visit (INDEPENDENT_AMBULATORY_CARE_PROVIDER_SITE_OTHER): Payer: BC Managed Care – PPO

## 2014-02-14 ENCOUNTER — Encounter: Payer: Self-pay | Admitting: Internal Medicine

## 2014-02-14 ENCOUNTER — Ambulatory Visit (INDEPENDENT_AMBULATORY_CARE_PROVIDER_SITE_OTHER): Payer: BC Managed Care – PPO | Admitting: Internal Medicine

## 2014-02-14 VITALS — BP 160/90 | HR 83 | Temp 98.0°F | Ht 62.0 in | Wt 157.8 lb

## 2014-02-14 DIAGNOSIS — R9431 Abnormal electrocardiogram [ECG] [EKG]: Secondary | ICD-10-CM

## 2014-02-14 DIAGNOSIS — I10 Essential (primary) hypertension: Secondary | ICD-10-CM

## 2014-02-14 DIAGNOSIS — Z0000 Encounter for general adult medical examination without abnormal findings: Secondary | ICD-10-CM

## 2014-02-14 LAB — CBC WITH DIFFERENTIAL/PLATELET
BASOS PCT: 0.3 % (ref 0.0–3.0)
Basophils Absolute: 0 10*3/uL (ref 0.0–0.1)
Eosinophils Absolute: 0.2 10*3/uL (ref 0.0–0.7)
Eosinophils Relative: 2.7 % (ref 0.0–5.0)
HEMATOCRIT: 44 % (ref 36.0–46.0)
HEMOGLOBIN: 14.8 g/dL (ref 12.0–15.0)
LYMPHS ABS: 3.1 10*3/uL (ref 0.7–4.0)
Lymphocytes Relative: 44 % (ref 12.0–46.0)
MCHC: 33.6 g/dL (ref 30.0–36.0)
MCV: 89.5 fl (ref 78.0–100.0)
MONOS PCT: 5.2 % (ref 3.0–12.0)
Monocytes Absolute: 0.4 10*3/uL (ref 0.1–1.0)
NEUTROS ABS: 3.4 10*3/uL (ref 1.4–7.7)
Neutrophils Relative %: 47.8 % (ref 43.0–77.0)
Platelets: 219 10*3/uL (ref 150.0–400.0)
RBC: 4.92 Mil/uL (ref 3.87–5.11)
RDW: 13.7 % (ref 11.5–15.5)
WBC: 7.2 10*3/uL (ref 4.0–10.5)

## 2014-02-14 LAB — LIPID PANEL
CHOLESTEROL: 192 mg/dL (ref 0–200)
HDL: 71.7 mg/dL (ref >39.00)
LDL Cholesterol: 106 mg/dL — ABNORMAL HIGH (ref 0–99)
NonHDL: 120.3
Total CHOL/HDL Ratio: 3
Triglycerides: 74 mg/dL (ref 0.0–149.0)
VLDL: 14.8 mg/dL (ref 0.0–40.0)

## 2014-02-14 LAB — HEPATIC FUNCTION PANEL
ALT: 44 U/L — AB (ref 0–35)
AST: 47 U/L — ABNORMAL HIGH (ref 0–37)
Albumin: 4.3 g/dL (ref 3.5–5.2)
Alkaline Phosphatase: 65 U/L (ref 39–117)
Bilirubin, Direct: 0.3 mg/dL (ref 0.0–0.3)
TOTAL PROTEIN: 7.7 g/dL (ref 6.0–8.3)
Total Bilirubin: 1.1 mg/dL (ref 0.2–1.2)

## 2014-02-14 LAB — BASIC METABOLIC PANEL
BUN: 10 mg/dL (ref 6–23)
CO2: 27 mEq/L (ref 19–32)
Calcium: 9.7 mg/dL (ref 8.4–10.5)
Chloride: 105 mEq/L (ref 96–112)
Creatinine, Ser: 1 mg/dL (ref 0.4–1.2)
GFR: 70.77 mL/min (ref >60.00)
GLUCOSE: 82 mg/dL (ref 70–99)
POTASSIUM: 4 meq/L (ref 3.5–5.1)
SODIUM: 142 meq/L (ref 135–145)

## 2014-02-14 LAB — TSH: TSH: 0.91 u[IU]/mL (ref 0.35–4.50)

## 2014-02-14 NOTE — Progress Notes (Signed)
Pre visit review using our clinic review tool, if applicable. No additional management support is needed unless otherwise documented below in the visit note. 

## 2014-02-14 NOTE — Progress Notes (Signed)
   Subjective:    Patient ID: Anna [name], female    DOB: 1951-03-04, 63 y.o.   MRN: [id number]  HPI  She is here for a physical;acute issues denied  Blood pressure average : 130/80 Compliant with anti hypertemsive medication. No lightheadedness or other adverse medication effect described.  A heart healthy /low salt diet is followed. Exercise encompasses 60 minutes 4  times per week as  Running or gym without symptoms.  Family history is negative for CVA.Strong FH HTN     Review of Systems    Significant headaches, epistaxis, chest pain, palpitations, exertional dyspnea, claudication, paroxysmal nocturnal dyspnea, or edema absent.      Objective:   Physical Exam Gen.: Healthy and well-nourished in appearance. Alert, appropriate and cooperative throughout exam. Appears younger than stated age  Head: Normocephalic without obvious abnormalities  Eyes: Prominent globes w/o lid lag or definite proptosis.No corneal or conjunctival inflammation noted. Pupils equal round reactive to light and accommodation. Extraocular motion intact.  Ears: External  ear exam reveals no significant lesions or deformities.Wax L > R w/o impaction.VisualizedTMs normal. Hearing is grossly normal bilaterally. Nose: External nasal exam reveals no deformity or inflammation. Nasal mucosa are pink and moist. No lesions or exudates noted.   Mouth: Oral mucosa and oropharynx reveal no lesions or exudates. Teeth in good repair. Neck: No deformities, masses, or tenderness noted. Range of motion & Thyroid normal Lungs: Normal respiratory effort; chest expands symmetrically. Lungs are clear to auscultation without rales, wheezes, or increased work of breathing. Heart: Normal rate and rhythm. Normal S1 and S2. No gallop, click, or rub. No murmur. Repeat BP 138/88 Abdomen: Bowel sounds normal; abdomen soft and nontender. No masses, organomegaly or hernias noted. Genitalia: as per Gyn                                    Musculoskeletal/extremities: No deformity or scoliosis noted of  the thoracic or lumbar spine.  No clubbing, cyanosis, edema, or significant extremity  deformity noted. Range of motion normal .Tone & strength normal. Hand joints normal Fingernail / toenail health good. Able to lie down & sit up w/o help. Negative SLR bilaterally Vascular: Carotid, radial artery, dorsalis pedis and  posterior tibial pulses are full and equal. No bruits present. Neurologic: Alert and oriented x3. Deep tendon reflexes symmetrical and normal.  Gait normal .       Skin: Intact without suspicious lesions or rashes. Lymph: No cervical, axillary lymphadenopathy present. Psych: Mood and affect are normal. Normally interactive                                                                                     Assessment & Plan:   #1 comprehensive physical exam; no acute findings  Plan: see Orders  & Recommendations

## 2014-02-14 NOTE — Patient Instructions (Signed)
Your next office appointment will be determined based upon review of your pending labs . Those instructions will be transmitted to you through My Chart  OR  by mail;whichever process is your choice to receive results & recommendations . Minimal Blood Pressure Goal= AVERAGE < 140/90;  Ideal is an AVERAGE < 135/85. This AVERAGE should be calculated from @ least 5-7 BP readings taken @ different times of day on different days of week. You should not respond to isolated BP readings , but rather the AVERAGE for that week .Please bring your  blood pressure cuff to office visits to verify that it is reliable.It  can also be checked against the blood pressure device at the pharmacy. Finger or wrist cuffs are not dependable; an arm cuff is.  Take the EKG to any emergency room or preop visits. There are nonspecific changes; as long as there is no new change these are not clinically significant . If the old EKG is not available for comparison; it may result in unnecessary hospitalization for observation with significant unnecessary expense.

## 2014-02-17 ENCOUNTER — Encounter: Payer: BC Managed Care – PPO | Admitting: Family Medicine

## 2014-02-18 ENCOUNTER — Telehealth: Payer: Self-pay

## 2014-02-18 NOTE — Telephone Encounter (Signed)
I faxed patient's wellness form to [phone] and mailed original copy today.

## 2014-03-05 ENCOUNTER — Telehealth: Payer: Self-pay | Admitting: *Deleted

## 2014-03-05 DIAGNOSIS — R7989 Other specified abnormal findings of blood chemistry: Secondary | ICD-10-CM

## 2014-03-05 DIAGNOSIS — R945 Abnormal results of liver function studies: Principal | ICD-10-CM

## 2014-03-05 NOTE — Telephone Encounter (Signed)
She wait 4 -6 weeks to help rule out progression. Repeating now won't provide that assessment

## 2014-03-05 NOTE — Telephone Encounter (Signed)
Pt stated she received her results from the labs and her liver functions was elevated. Pt states she is concern because everything md stated to avoid she doesn't use. Wanting to have liver functions rechecked...Anna [name]

## 2014-03-05 NOTE — Telephone Encounter (Signed)
Called pt no ansew LMOM with md response. Order has been place...Anna [name]

## 2014-03-19 ENCOUNTER — Other Ambulatory Visit (HOSPITAL_BASED_OUTPATIENT_CLINIC_OR_DEPARTMENT_OTHER): Payer: Self-pay | Admitting: Obstetrics and Gynecology

## 2014-03-19 ENCOUNTER — Encounter: Payer: Self-pay | Admitting: Internal Medicine

## 2014-03-19 DIAGNOSIS — Z1231 Encounter for screening mammogram for malignant neoplasm of breast: Secondary | ICD-10-CM

## 2014-04-02 ENCOUNTER — Telehealth: Payer: Self-pay | Admitting: *Deleted

## 2014-04-02 ENCOUNTER — Other Ambulatory Visit (INDEPENDENT_AMBULATORY_CARE_PROVIDER_SITE_OTHER): Payer: BC Managed Care – PPO

## 2014-04-02 ENCOUNTER — Ambulatory Visit (HOSPITAL_BASED_OUTPATIENT_CLINIC_OR_DEPARTMENT_OTHER)
Admission: RE | Admit: 2014-04-02 | Discharge: 2014-04-02 | Disposition: A | Discharge status: Home / Self Care | Payer: BC Managed Care – PPO | Source: Ambulatory Visit | Attending: Obstetrics and Gynecology | Admitting: Obstetrics and Gynecology

## 2014-04-02 DIAGNOSIS — Z1231 Encounter for screening mammogram for malignant neoplasm of breast: Secondary | ICD-10-CM

## 2014-04-02 DIAGNOSIS — R7989 Other specified abnormal findings of blood chemistry: Secondary | ICD-10-CM

## 2014-04-02 DIAGNOSIS — R945 Abnormal results of liver function studies: Principal | ICD-10-CM

## 2014-04-02 NOTE — Telephone Encounter (Signed)
Left msg on triage md want her to repeat labs wanting to see if order has been place. Called pt back no answer LMOM yes md has already entered labs order...Anna [name]

## 2014-04-04 LAB — HEPATIC FUNCTION PANEL
ALBUMIN: 4.2 g/dL (ref 3.5–5.2)
ALK PHOS: 59 U/L (ref 39–117)
ALT: 37 U/L — AB (ref 0–35)
AST: 41 U/L — AB (ref 0–37)
Bilirubin, Direct: 0.1 mg/dL (ref 0.0–0.3)
Total Bilirubin: 0.6 mg/dL (ref 0.2–1.2)
Total Protein: 7.8 g/dL (ref 6.0–8.3)

## 2014-08-08 ENCOUNTER — Ambulatory Visit: Payer: Self-pay | Admitting: Podiatry

## 2014-08-13 ENCOUNTER — Encounter: Payer: Self-pay | Admitting: Podiatry

## 2014-08-13 ENCOUNTER — Ambulatory Visit (INDEPENDENT_AMBULATORY_CARE_PROVIDER_SITE_OTHER): Payer: BLUE CROSS/BLUE SHIELD | Admitting: Podiatry

## 2014-08-13 VITALS — BP 174/95 | HR 69

## 2014-08-13 DIAGNOSIS — M722 Plantar fascial fibromatosis: Secondary | ICD-10-CM

## 2014-08-13 DIAGNOSIS — M21961 Unspecified acquired deformity of right lower leg: Secondary | ICD-10-CM

## 2014-08-13 NOTE — Patient Instructions (Signed)
Seen for right heel pain. Both feet casted for a new pair of custom orthotics. May benefit by using Metatarsal binder that was dispensed on her last visit in 2014.

## 2014-08-13 NOTE — Progress Notes (Signed)
Subjective: 64 year old female presents complaining of pain in right heel. She has had previous episode and did well with orthotic treatment. Old orthotics are cracked and 62 years old.   Objective: Excess sagittal plane motion first ray R>L. Right heel pain. Neurovascular status are within normal.  Assessemt: Plantar fasciitis right. Metatarsal deformity right.  Plan: Reviewed findings. Both feet casted for orthotics.

## 2014-08-25 ENCOUNTER — Other Ambulatory Visit: Payer: Self-pay | Admitting: *Deleted

## 2014-08-25 MED ORDER — LOSARTAN POTASSIUM 100 MG PO TABS: 100.0000 mg | ORAL_TABLET | Freq: Every day | ORAL | Status: DC

## 2014-08-25 MED ORDER — AMLODIPINE BESYLATE 5 MG PO TABS: 5.0000 mg | ORAL_TABLET | Freq: Every day | ORAL | Status: DC

## 2014-08-25 NOTE — Telephone Encounter (Signed)
Pt states she has change pharmacy needing her norvasc & losartan sent to [facility]. Inform pt will send...Johny Chess

## 2014-10-03 ENCOUNTER — Ambulatory Visit: Payer: BLUE CROSS/BLUE SHIELD | Admitting: Podiatry

## 2014-10-14 ENCOUNTER — Telehealth: Payer: Self-pay | Admitting: Internal Medicine

## 2014-10-14 DIAGNOSIS — R945 Abnormal results of liver function studies: Principal | ICD-10-CM

## 2014-10-14 DIAGNOSIS — R7989 Other specified abnormal findings of blood chemistry: Secondary | ICD-10-CM

## 2014-10-14 NOTE — Telephone Encounter (Signed)
Order placed. Left message for patient that order has been placed.

## 2014-10-14 NOTE — Telephone Encounter (Signed)
Pt called stated that suppose get her liver  (hepatic)tested every 6 month, please check, no order in the system

## 2014-10-22 ENCOUNTER — Encounter: Payer: Self-pay | Admitting: Podiatry

## 2014-10-22 ENCOUNTER — Ambulatory Visit (INDEPENDENT_AMBULATORY_CARE_PROVIDER_SITE_OTHER): Payer: BLUE CROSS/BLUE SHIELD | Admitting: Podiatry

## 2014-10-22 VITALS — BP 164/91 | HR 87

## 2014-10-22 DIAGNOSIS — M21961 Unspecified acquired deformity of right lower leg: Secondary | ICD-10-CM

## 2014-10-22 NOTE — Progress Notes (Signed)
Wearing orthotics. Not seeing much improvement. Right foot bunion bothers if wearing certain shoes. Heel is ok.  Patient was hoping the bunion would go away with orthotics. Now wearing boots. Discussed about shoe selection in summer.

## 2014-10-22 NOTE — Patient Instructions (Signed)
Orthotic follow up. Doing well on heel problem. Still having bunion issue. Need to consider sensible shoe selection.

## 2014-10-24 ENCOUNTER — Other Ambulatory Visit: Payer: BLUE CROSS/BLUE SHIELD

## 2014-10-24 DIAGNOSIS — R7989 Other specified abnormal findings of blood chemistry: Secondary | ICD-10-CM

## 2014-10-24 DIAGNOSIS — R945 Abnormal results of liver function studies: Principal | ICD-10-CM

## 2014-10-24 LAB — HEPATIC FUNCTION PANEL
ALK PHOS: 63 U/L (ref 39–117)
ALT: 35 U/L (ref 0–35)
AST: 34 U/L (ref 0–37)
Albumin: 4.2 g/dL (ref 3.5–5.2)
BILIRUBIN DIRECT: 0.2 mg/dL (ref 0.0–0.3)
BILIRUBIN TOTAL: 0.7 mg/dL (ref 0.2–1.2)
Indirect Bilirubin: 0.5 mg/dL (ref 0.2–1.2)
Total Protein: 7.2 g/dL (ref 6.0–8.3)

## 2014-10-27 NOTE — Progress Notes (Signed)
Mailed results to pt.

## 2014-11-06 ENCOUNTER — Ambulatory Visit: Payer: BLUE CROSS/BLUE SHIELD | Admitting: Internal Medicine

## 2014-11-06 ENCOUNTER — Encounter: Payer: Self-pay | Admitting: Internal Medicine

## 2014-11-06 ENCOUNTER — Ambulatory Visit (INDEPENDENT_AMBULATORY_CARE_PROVIDER_SITE_OTHER): Payer: BLUE CROSS/BLUE SHIELD | Admitting: Internal Medicine

## 2014-11-06 ENCOUNTER — Other Ambulatory Visit (INDEPENDENT_AMBULATORY_CARE_PROVIDER_SITE_OTHER): Payer: BLUE CROSS/BLUE SHIELD

## 2014-11-06 VITALS — BP 140/88 | HR 76 | Temp 98.5°F | Ht 61.0 in | Wt 164.0 lb

## 2014-11-06 DIAGNOSIS — R202 Paresthesia of skin: Secondary | ICD-10-CM

## 2014-11-06 LAB — TSH: TSH: 1.01 u[IU]/mL (ref 0.35–4.50)

## 2014-11-06 LAB — VITAMIN B12: VITAMIN B 12: 188 pg/mL — AB (ref 211–911)

## 2014-11-06 LAB — HEMOGLOBIN A1C: Hgb A1c MFr Bld: 5.7 % (ref 4.6–6.5)

## 2014-11-06 NOTE — Progress Notes (Signed)
   Subjective:    Patient ID: Anna Anna [name], female    DOB: [birth year], 64 y.o.   MRN: [id number]  HPI 7 months ago she had numbness involving the right thumb and index finger; as of the last 2 months it is on the left as well. IT is now essentially constant. There was no trigger initially and there are no present triggers or exacerbating factors. She also notices no activity or intervention which relieves symptoms  Unrelated has been some tingling in her feet.  PMH of + HSV (DIF ). Mother had Parkinson's.     Review of Systems Fever, chills, sweats, or unexplained weight loss not present. No significant headaches. Mental status change or memory loss denied. Blurred vision , diplopia or vision loss absent. Vertigo, near syncope or imbalance denied. No loss of control of bladder or bowels. Radicular type pain absent. No seizure stigmata.     Objective:   Physical Exam Anna [name] appearance:Adequately nourished; no acute distress or increased work of breathing is present.    Lymphatic: No  lymphadenopathy about the head, neck, or axilla .  Eyes: No conjunctival inflammation or lid edema is present. There is no scleral icterus.EOM & FOV intact  Ears:  External ear exam shows no significant lesions or deformities.  Otoscopic examination reveals clear canals, tympanic membranes are intact bilaterally without bulging, retraction, inflammation or discharge.  Nose:  External nasal examination shows no deformity or inflammation. Nasal mucosa are pink and moist without lesions or exudates No septal dislocation or deviation.No obstruction to airflow.   Oral exam: Dental hygiene is good; lips and gums are healthy appearing.There is no oropharyngeal erythema or exudate.Smile minimally asymmetric.  Neck:  No deformities, thyromegaly, masses, or tenderness noted.   Supple with full range of motion without pain.   Heart:  Normal rate and regular rhythm. S1 and S2 normal without gallop, murmur,  click, rub or other extra sounds.   Lungs:Chest clear to auscultation; no wheezes, rhonchi,rales ,or rubs present.  Extremities:  No cyanosis, edema, or clubbing  noted Skin: Warm & dry w/o tenting . No significant lesions or rash.  Neurologic exam : Cn 2-7 intact Strength equal & normal in upper & lower extremities Able to walk on heels and toes.   Balance normal  Romberg normal, finger to nose normal.Sensation to ligt touch normal. Positive Tinel's bilaterally.        Assessment & Plan:  #1 paresthesias of thumbs & index fingers Plan: B12, A1c, TSH,RPR Neurology referral if labs negative for NCT/EMG

## 2014-11-06 NOTE — Progress Notes (Signed)
Pre visit review using our clinic review tool, if applicable. No additional management support is needed unless otherwise documented below in the visit note. 

## 2014-11-06 NOTE — Patient Instructions (Signed)
  Your next office appointment will be determined based upon review of your pending labs Those instructions will be transmitted to you by mail for your records.  Critical results will be called.   Followup as needed for any active or acute issue. Please report any significant change in your symptoms. 

## 2014-11-07 ENCOUNTER — Other Ambulatory Visit: Payer: Self-pay | Admitting: Internal Medicine

## 2014-11-07 ENCOUNTER — Encounter: Payer: Self-pay | Admitting: *Deleted

## 2014-11-07 ENCOUNTER — Encounter: Payer: Self-pay | Admitting: Internal Medicine

## 2014-11-07 DIAGNOSIS — E538 Deficiency of other specified B group vitamins: Secondary | ICD-10-CM

## 2014-11-07 LAB — RPR

## 2014-11-17 ENCOUNTER — Telehealth: Payer: Self-pay | Admitting: Internal Medicine

## 2014-11-17 NOTE — Telephone Encounter (Signed)
She has some questions regarding her lab results. Please call patient

## 2014-11-17 NOTE — Telephone Encounter (Signed)
Phone call to patient. She has been advised she needs to return for additional lab testing regarding her b12 deficiency. She states she will do so next week.

## 2014-11-26 ENCOUNTER — Other Ambulatory Visit: Payer: BLUE CROSS/BLUE SHIELD

## 2014-11-26 DIAGNOSIS — E538 Deficiency of other specified B group vitamins: Secondary | ICD-10-CM

## 2014-12-02 LAB — METHYLMALONIC ACID, SERUM: METHYLMALONIC ACID, QUANT: 282 nmol/L (ref 87–318)

## 2015-02-16 ENCOUNTER — Encounter: Payer: Self-pay | Admitting: Internal Medicine

## 2015-02-16 ENCOUNTER — Other Ambulatory Visit (INDEPENDENT_AMBULATORY_CARE_PROVIDER_SITE_OTHER): Payer: BLUE CROSS/BLUE SHIELD

## 2015-02-16 ENCOUNTER — Ambulatory Visit (INDEPENDENT_AMBULATORY_CARE_PROVIDER_SITE_OTHER): Payer: BLUE CROSS/BLUE SHIELD | Admitting: Internal Medicine

## 2015-02-16 VITALS — BP 140/93 | HR 107 | Temp 98.0°F | Resp 16 | Ht 62.0 in | Wt 158.0 lb

## 2015-02-16 DIAGNOSIS — Z0189 Encounter for other specified special examinations: Secondary | ICD-10-CM

## 2015-02-16 DIAGNOSIS — Z0000 Encounter for general adult medical examination without abnormal findings: Secondary | ICD-10-CM

## 2015-02-16 DIAGNOSIS — I10 Essential (primary) hypertension: Secondary | ICD-10-CM

## 2015-02-16 LAB — CBC WITH DIFFERENTIAL/PLATELET
BASOS PCT: 1.1 % (ref 0.0–3.0)
Basophils Absolute: 0.1 10*3/uL (ref 0.0–0.1)
Eosinophils Absolute: 0.1 10*3/uL (ref 0.0–0.7)
Eosinophils Relative: 1.6 % (ref 0.0–5.0)
HCT: 44.8 % (ref 36.0–46.0)
Hemoglobin: 15.6 g/dL — ABNORMAL HIGH (ref 12.0–15.0)
LYMPHS PCT: 51.4 % — AB (ref 12.0–46.0)
Lymphs Abs: 3.1 10*3/uL (ref 0.7–4.0)
MCHC: 34.7 g/dL (ref 30.0–36.0)
MCV: 87.9 fl (ref 78.0–100.0)
Monocytes Absolute: 0.3 10*3/uL (ref 0.1–1.0)
Monocytes Relative: 5.1 % (ref 3.0–12.0)
NEUTROS PCT: 40.8 % — AB (ref 43.0–77.0)
Neutro Abs: 2.5 10*3/uL (ref 1.4–7.7)
Platelets: 221 10*3/uL (ref 150.0–400.0)
RBC: 5.1 Mil/uL (ref 3.87–5.11)
RDW: 13.4 % (ref 11.5–15.5)
WBC: 6.1 10*3/uL (ref 4.0–10.5)

## 2015-02-16 LAB — LIPID PANEL
CHOLESTEROL: 186 mg/dL (ref 0–200)
HDL: 64.1 mg/dL (ref >39.00)
LDL Cholesterol: 104 mg/dL — ABNORMAL HIGH (ref 0–99)
NonHDL: 122.24
TRIGLYCERIDES: 90 mg/dL (ref 0.0–149.0)
Total CHOL/HDL Ratio: 3
VLDL: 18 mg/dL (ref 0.0–40.0)

## 2015-02-16 LAB — HEPATIC FUNCTION PANEL
ALT: 42 U/L — ABNORMAL HIGH (ref 0–35)
AST: 44 U/L — ABNORMAL HIGH (ref 0–37)
Albumin: 4.6 g/dL (ref 3.5–5.2)
Alkaline Phosphatase: 58 U/L (ref 39–117)
BILIRUBIN DIRECT: 0.4 mg/dL — AB (ref 0.0–0.3)
BILIRUBIN TOTAL: 1 mg/dL (ref 0.2–1.2)
TOTAL PROTEIN: 7.9 g/dL (ref 6.0–8.3)

## 2015-02-16 LAB — BASIC METABOLIC PANEL
BUN: 10 mg/dL (ref 6–23)
CO2: 26 mEq/L (ref 19–32)
Calcium: 9.9 mg/dL (ref 8.4–10.5)
Chloride: 102 mEq/L (ref 96–112)
Creatinine, Ser: 0.93 mg/dL (ref 0.40–1.20)
GFR: 78.48 mL/min (ref >60.00)
Glucose, Bld: 95 mg/dL (ref 70–99)
POTASSIUM: 4 meq/L (ref 3.5–5.1)
Sodium: 140 mEq/L (ref 135–145)

## 2015-02-16 LAB — TSH: TSH: 1.3 u[IU]/mL (ref 0.35–4.50)

## 2015-02-16 LAB — VITAMIN B12: VITAMIN B 12: 297 pg/mL (ref 211–911)

## 2015-02-16 MED ORDER — HYDROCHLOROTHIAZIDE 12.5 MG PO CAPS: 12.5000 mg | ORAL_CAPSULE | Freq: Every day | ORAL | Status: DC

## 2015-02-16 NOTE — Progress Notes (Signed)
   Subjective:    Patient ID: [name], female    DOB: [birth year], 64 y.o.   MRN: [id number]  HPI She is here for a physical;acute issues denied.   She does not eat meat or fried foods. She does not add salt at the table. She walks 4 days a week up to a mile at a time without cardio pulmonary symptoms except for some intermittent initial dyspnea .  Blood pressure at home averages 130/90. Her blood pressure cuff from home correlated with blood pressure found today. She has been compliant with her medications  Colonoscopy is due next year. She has no active GI symptoms. She did have mild esophagitis distal esophageal ringlike structure in 01/12.  She does have nocturia 1-2 times per night but states she drinks increased fluids.  She's never smoked and does not drink.  Her sister did have a myocardial infarction at age 53. Her sister was a smoker.    Review of Systems  Chest pain, palpitations, tachycardia,  paroxysmal nocturnal dyspnea, claudication or edema are absent. No unexplained weight loss, abdominal pain, significant dyspepsia, dysphagia, melena, rectal bleeding, or persistently small caliber stools. Dysuria, pyuria, hematuria, frequency, or polyuria are denied. Change in hair, skin, nails denied. No bowel changes of constipation or diarrhea. No intolerance to heat or cold.      Objective:   Physical Exam  Pertinent or positive findings include: Wax is present both otic canals; but hearing is intact. S1 is accentuated. She has crepitus of the knees. General appearance :adequately nourished; in no distress.  Eyes: No conjunctival inflammation or scleral icterus is present.  Oral exam:  Lips and gums are healthy appearing.There is no oropharyngeal erythema or exudate noted. Dental hygiene is good.  Heart:  Normal rate and regular rhythm. S2 normal without gallop, murmur, click, rub or other extra sounds    Lungs:Chest clear to auscultation; no wheezes, rhonchi,rales ,or  rubs present.No increased work of breathing.   Abdomen: bowel sounds normal, soft and non-tender without masses, organomegaly or hernias noted.  No guarding or rebound.   Vascular : all pulses equal ; no bruits present.  Skin:Warm & dry.  Intact without suspicious lesions or rashes ; no tenting   Lymphatic: No lymphadenopathy is noted about the head, neck, axilla  Neuro: Strength, tone & DTRs normal.        Assessment & Plan:  #1 comprehensive physical exam; no acute findings  Plan: see Orders  & Recommendations

## 2015-02-16 NOTE — Progress Notes (Signed)
Pre visit review using our clinic review tool, if applicable. No additional management support is needed unless otherwise documented below in the visit note. 

## 2015-02-16 NOTE — Patient Instructions (Addendum)
Minimal Blood Pressure Goal= AVERAGE < 140/90;  Ideal is an AVERAGE < 135/85. This AVERAGE should be calculated from @ least 5-7 BP readings taken @ different times of day on different days of week. You should not respond to isolated BP readings , but rather the AVERAGE for that week .Please bring your  blood pressure cuff to office visits to verify that it is reliable.It  can also be checked against the blood pressure device at the pharmacy. Finger or wrist cuffs are not dependable; an arm cuff is.  Fill the  prescription for the BP medication if BP NOT @ goal based on  7 to 14 day average.   Your next office appointment will be determined based upon review of your pending labs .  Those written interpretation of the lab results and instructions will be transmitted to you by mail for your records.  Critical results will be called.   Followup as needed for any active or acute issue. Please report any significant change in your symptoms.

## 2015-02-17 ENCOUNTER — Telehealth: Payer: Self-pay | Admitting: Emergency Medicine

## 2015-02-17 NOTE — Telephone Encounter (Signed)
Spoke with pt. Mailing original copy of [employer] form back to pt after faxing to company.

## 2015-03-10 ENCOUNTER — Telehealth: Payer: Self-pay | Admitting: Internal Medicine

## 2015-03-10 NOTE — Telephone Encounter (Signed)
Pt called in and she rec a letter in the mail that said that her cbc needed to be repeated.  She wanted to know if this was something she was suppose to do?

## 2015-03-11 ENCOUNTER — Telehealth: Payer: Self-pay | Admitting: Emergency Medicine

## 2015-03-11 DIAGNOSIS — I10 Essential (primary) hypertension: Secondary | ICD-10-CM

## 2015-03-11 NOTE — Telephone Encounter (Signed)
CBC and Hepatic Funct have been entered. Pt should come fasting.

## 2015-03-12 ENCOUNTER — Other Ambulatory Visit (INDEPENDENT_AMBULATORY_CARE_PROVIDER_SITE_OTHER): Payer: BLUE CROSS/BLUE SHIELD

## 2015-03-12 DIAGNOSIS — I10 Essential (primary) hypertension: Secondary | ICD-10-CM

## 2015-03-12 LAB — CBC WITH DIFFERENTIAL/PLATELET
Basophils Absolute: 0.2 10*3/uL — ABNORMAL HIGH (ref 0.0–0.1)
Basophils Relative: 2.3 % (ref 0.0–3.0)
EOS ABS: 0.4 10*3/uL (ref 0.0–0.7)
Eosinophils Relative: 4.8 % (ref 0.0–5.0)
HCT: 42.3 % (ref 36.0–46.0)
Hemoglobin: 14.3 g/dL (ref 12.0–15.0)
LYMPHS ABS: 3.6 10*3/uL (ref 0.7–4.0)
Lymphocytes Relative: 48.3 % — ABNORMAL HIGH (ref 12.0–46.0)
MCHC: 33.7 g/dL (ref 30.0–36.0)
MCV: 89.1 fl (ref 78.0–100.0)
Monocytes Absolute: 0.6 10*3/uL (ref 0.1–1.0)
Monocytes Relative: 7.7 % (ref 3.0–12.0)
NEUTROS PCT: 36.9 % — AB (ref 43.0–77.0)
Neutro Abs: 2.7 10*3/uL (ref 1.4–7.7)
Platelets: 247 10*3/uL (ref 150.0–400.0)
RBC: 4.75 Mil/uL (ref 3.87–5.11)
RDW: 13.8 % (ref 11.5–15.5)
WBC: 7.4 10*3/uL (ref 4.0–10.5)

## 2015-03-12 LAB — HEPATIC FUNCTION PANEL
ALT: 35 U/L (ref 0–35)
AST: 33 U/L (ref 0–37)
Albumin: 4.2 g/dL (ref 3.5–5.2)
Alkaline Phosphatase: 59 U/L (ref 39–117)
Bilirubin, Direct: 0.2 mg/dL (ref 0.0–0.3)
Total Bilirubin: 0.5 mg/dL (ref 0.2–1.2)
Total Protein: 7.4 g/dL (ref 6.0–8.3)

## 2015-03-13 ENCOUNTER — Other Ambulatory Visit: Payer: Self-pay | Admitting: Emergency Medicine

## 2015-03-13 MED ORDER — AMLODIPINE BESYLATE 5 MG PO TABS: 5.0000 mg | ORAL_TABLET | Freq: Every day | ORAL | Status: DC

## 2015-03-13 MED ORDER — LOSARTAN POTASSIUM 100 MG PO TABS: 100.0000 mg | ORAL_TABLET | Freq: Every day | ORAL | Status: DC

## 2015-04-01 ENCOUNTER — Telehealth: Payer: Self-pay | Admitting: Internal Medicine

## 2015-04-01 NOTE — Telephone Encounter (Signed)
Pt called request to speak to the assistant concern about the lab result that she got. Need to know if she need to repeat CBC again or only when she has symptom?

## 2015-04-02 NOTE — Telephone Encounter (Signed)
Repeat CBC & dif only with any acute infection

## 2015-04-02 NOTE — Telephone Encounter (Signed)
Please advise if pt needs to have CBC repeated after testing on 8/25

## 2015-04-03 NOTE — Telephone Encounter (Signed)
Spoke with pt to inform of [facility]opp's notes

## 2015-04-06 ENCOUNTER — Other Ambulatory Visit (HOSPITAL_BASED_OUTPATIENT_CLINIC_OR_DEPARTMENT_OTHER): Payer: Self-pay | Admitting: Obstetrics and Gynecology

## 2015-04-06 ENCOUNTER — Other Ambulatory Visit (HOSPITAL_COMMUNITY): Payer: Self-pay | Admitting: Obstetrics and Gynecology

## 2015-04-06 DIAGNOSIS — Z1231 Encounter for screening mammogram for malignant neoplasm of breast: Secondary | ICD-10-CM

## 2015-04-14 ENCOUNTER — Ambulatory Visit (HOSPITAL_BASED_OUTPATIENT_CLINIC_OR_DEPARTMENT_OTHER)
Admission: RE | Admit: 2015-04-14 | Discharge: 2015-04-14 | Disposition: A | Discharge status: Home / Self Care | Payer: BLUE CROSS/BLUE SHIELD | Source: Ambulatory Visit | Attending: Obstetrics and Gynecology | Admitting: Obstetrics and Gynecology

## 2015-04-14 DIAGNOSIS — Z1231 Encounter for screening mammogram for malignant neoplasm of breast: Secondary | ICD-10-CM | POA: Diagnosis present

## 2015-04-22 ENCOUNTER — Encounter: Payer: Self-pay | Admitting: Internal Medicine

## 2015-04-22 ENCOUNTER — Ambulatory Visit (INDEPENDENT_AMBULATORY_CARE_PROVIDER_SITE_OTHER): Payer: BLUE CROSS/BLUE SHIELD | Admitting: Internal Medicine

## 2015-04-22 VITALS — BP 150/78 | HR 76 | Temp 98.7°F | Resp 16 | Ht 62.0 in | Wt 160.0 lb

## 2015-04-22 DIAGNOSIS — M6588 Other synovitis and tenosynovitis, other site: Secondary | ICD-10-CM | POA: Diagnosis not present

## 2015-04-22 DIAGNOSIS — I10 Essential (primary) hypertension: Secondary | ICD-10-CM

## 2015-04-22 DIAGNOSIS — M659 Synovitis and tenosynovitis, unspecified: Secondary | ICD-10-CM

## 2015-04-22 DIAGNOSIS — M65949 Unspecified synovitis and tenosynovitis, unspecified hand: Secondary | ICD-10-CM

## 2015-04-22 NOTE — Progress Notes (Signed)
   Subjective:    Patient ID: Anna Anna [name], female    DOB: 1951-06-09, 64 y.o.   MRN: [id number]  HPI She has had numbness in both thumbs for 9 months. 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 The numbness has progressed in the right thumb and also has been associated with some pain. It is described as achy up to a level VIII/X. This is only present with manual activity. She also notices some clicking in the thumb. A forearm brace has been of no benefit.  Her vitamin B12 level was 188 on 4/21. Repeat was 297 on 8/1. Methylmalonic acid level was also normal. TSH and VDRL were normal.  She did not fill bilateral thiazide and  her blood pressure is well controlled @ home with values of 130/85.  Review of Systems Fever, chills, sweats, or unexplained weight loss not present. No significant headaches. Mental status change or memory loss denied. Blurred vision , diplopia or vision loss absent. Vertigo, near syncope or imbalance denied. There is no  tingling or weakness in extremities.   No loss of control of bladder or bowels. Radicular type pain absent. No seizure stigmata.  Chest pain, palpitations, tachycardia, exertional dyspnea, paroxysmal nocturnal dyspnea, claudication or edema are absent.      Objective:   Physical Exam Pertinent or positive findings include: Cranial nerve exam is normal. Testing for strength in hands is also normal. Tinel's sign is negative. She has pain in both thumbs with range of motion of the thumb both dorsally and ventrally over the thumb.  Anna [name] appearance :adequately nourished; in no distress.  Eyes: No conjunctival inflammation or scleral icterus is present.  Oral exam:  Lips and gums are healthy appearing.There is no oropharyngeal erythema or exudate noted. Dental hygiene is good.  Heart:  Normal rate and regular rhythm. S1 and S2 normal without gallop, murmur, click, rub or other extra sounds     Lungs:Chest clear to auscultation; no wheezes, rhonchi,rales ,or rubs present.No increased work of breathing.   Abdomen: bowel sounds normal, soft and non-tender without masses, organomegaly or hernias noted.  No guarding or rebound.   Vascular : all pulses equal ; no bruits present.  Skin:Warm & dry.  Intact without suspicious lesions or rashes ; no tenting    Lymphatic: No lymphadenopathy is noted about the head, neck, axilla.   Neuro: Strength, tone & DTRs normal.     Assessment & Plan:  #1 paresthesias and pain in both thumbs, right greater than left. Tenosynovitis variant suggested rather than neuropathy. #2 HTN, ? control Plan: See orders and recommendations

## 2015-04-22 NOTE — Progress Notes (Signed)
Pre visit review using our clinic review tool, if applicable. No additional management support is needed unless otherwise documented below in the visit note. 

## 2015-04-22 NOTE — Patient Instructions (Addendum)
The Hand Surgery referral will be scheduled and you'll be notified of the time.Please call the Referral Co-Ordinator @ [phone] if you have not been notified of appointment time within 7-10 days.  Minimal Blood Pressure Goal= AVERAGE < 140/90;  Ideal is an AVERAGE < 135/85. This AVERAGE should be calculated from @ least 5-7 BP readings taken @ different times of day on different days of week. You should not respond to isolated BP readings , but rather the AVERAGE for that week .Please bring your  blood pressure cuff to office visits to verify that it is reliable.It  can also be checked against the blood pressure device at the pharmacy. Finger or wrist cuffs are not dependable; an arm cuff is.

## 2015-06-18 DIAGNOSIS — B1920 Unspecified viral hepatitis C without hepatic coma: Secondary | ICD-10-CM | POA: Insufficient documentation

## 2015-06-29 ENCOUNTER — Ambulatory Visit (INDEPENDENT_AMBULATORY_CARE_PROVIDER_SITE_OTHER): Payer: BLUE CROSS/BLUE SHIELD | Admitting: Internal Medicine

## 2015-06-29 ENCOUNTER — Encounter: Payer: Self-pay | Admitting: Internal Medicine

## 2015-06-29 VITALS — BP 160/90 | HR 72 | Ht 61.75 in | Wt 163.4 lb

## 2015-06-29 DIAGNOSIS — K2100 Gastro-esophageal reflux disease with esophagitis, without bleeding: Secondary | ICD-10-CM

## 2015-06-29 DIAGNOSIS — R140 Abdominal distension (gaseous): Secondary | ICD-10-CM

## 2015-06-29 DIAGNOSIS — Z1211 Encounter for screening for malignant neoplasm of colon: Secondary | ICD-10-CM

## 2015-06-29 DIAGNOSIS — R109 Unspecified abdominal pain: Secondary | ICD-10-CM | POA: Diagnosis not present

## 2015-06-29 DIAGNOSIS — K8020 Calculus of gallbladder without cholecystitis without obstruction: Secondary | ICD-10-CM

## 2015-06-29 DIAGNOSIS — K648 Other hemorrhoids: Secondary | ICD-10-CM

## 2015-06-29 MED ORDER — NA SULFATE-K SULFATE-MG SULF 17.5-3.13-1.6 GM/177ML PO SOLN: ORAL | Status: DC

## 2015-06-29 NOTE — Progress Notes (Signed)
HISTORY OF PRESENT ILLNESS:  Anna [name] is a 64 y.o. female with past medical history as listed below. She has not been seen in this office since December 2011. She presents today with several chief complaints including upper abdominal pain, bloating with increased flatus, symptomatic hemorrhoids, and the need for colonoscopy. The patient is known to have cholelithiasis which has been felt to be symptomatic in the past. She has had surgical evaluation with cholecystectomy recommended, though she has not. She reports to me developing upper abdominal pain described as tightness proximal 6 weeks ago. This would last for several hours and then resolved. Seemed to be associated with meals. No nocturnal component. No associated nausea or vomiting. She reports having had similar discomfort in the remote past. She has had no problems in recent weeks. She does have long-standing abdominal bloating with increased intestinal gas as manifested by flatus which she can pass spontaneously. No weight loss. She has had upper endoscopy in January 2012 which revealed mild esophagitis in distal stricture. She was prescribed PPI. She is not taking PPI. She denies heartburn or indigestion. She denies dysphagia. Her last complete colonoscopy was performed 08/02/2005. This was normal except for the presence of hemorrhoids. In terms of hemorrhoids she does describe occasional itching with burning and mild bleeding. Preparation H helps. Review of laboratories from August 2016 reveals transient mild elevation of liver tests. Remainder of comprehensive metabolic panel normal. Most recent liver tests normal. Normal CBC and TSH. She does take occasional ibuprofen.  REVIEW OF SYSTEMS:  All non-GI ROS negative except for arthritis, itching, night sweats  Past Medical History  Diagnosis Date  . Hypertension   . Atypical chest pain     Negative nuclear stress test  . Cholelithiasis     Single stone  . Esophageal stricture   .  Hematuria, microscopic   . GERD (gastroesophageal reflux disease)   . Rhinitis     seasonal  . Cholelithiasis   . Hemorrhoids, internal     and external  . Hiatal hernia     EGD 07-2008  . Vitamin D deficiency     Past Surgical History  Procedure Laterality Date  . Appendectomy    . Abdominal hysterectomy      fibroids &dysfunctional menes; [facility]  . Colonoscopy  2007    Dr. Henrene Pastor, due 2017  . Ct angiogram  01/2006    Negative for PE  . G2 p1    . Refractive surgery    . Upper gi endoscopy  07/2010    Esophagitis; distal esophageal ringlike structure    Social History Anna [name]  reports that she has never smoked. She has never used smokeless tobacco. She reports that she does not drink alcohol or use illicit drugs.  family history includes Arthritis in her mother; Heart attack (age of onset: 55) in her sister; Hypertension in her brother, maternal grandmother, mother, sister, sister, and sister; Parkinsonism in her mother; Prostate cancer in her brother and father. There is no history of Diabetes or Stroke.  Allergies  Allergen Reactions  . Tramadol     SEVERE HEADACHES        PHYSICAL EXAMINATION: Vital signs: BP 160/90 mmHg  Pulse 72  Ht 5' 1.75" (1.568 m)  Wt 163 lb 6 oz (74.106 kg)  BMI 30.14 kg/m2  Constitutional: generally well-appearing, no acute distress Psychiatric: alert and oriented x3, cooperative Eyes: extraocular movements intact, anicteric, conjunctiva pink Mouth: oral pharynx moist, no lesions Neck: supple without thyromegaly Lymph: no  lymphadenopathy Cardiovascular: heart regular rate and rhythm, no murmur Lungs: clear to auscultation bilaterally Abdomen: soft, obese, nontender, nondistended, no obvious ascites, no peritoneal signs, normal bowel sounds, no organomegaly. Right lower quadrant appendectomy scar well-healed Rectal: Deferred until colonoscopy Extremities: no clubbing cyanosis or lower extremity edema bilaterally Skin: no lesions on  visible extremities Neuro: No focal deficits. Deep tendon reflexes intact.   ASSESSMENT:  #1. Recent problems with intermittent upper abdominal discomfort occurring over the course of one week as described. Suspect related to known cholelithiasis. Rule out upper GI mucosal lesions such as ulcers #2. Reflux esophagitis and peptic stricture on upper endoscopy. Asymptomatic without medical therapy. Will reevaluate #3. Abdominal bloating with increased intestinal gas #4. Symptomatic hemorrhoids #5. Screening colonoscopy. Due. Last examination January 2007 negative including ileal intubation   PLAN:  #1. Educational discussion on the pathophysiology of intestinal gas #2. Literature on intestinal gas provided for her review #3. Anti-gas and flatulence dietary sheet provided #4. Schedule upper endoscopy to evaluate upper abdominal pain.The nature of the procedure, as well as the risks, benefits, and alternatives were carefully and thoroughly reviewed with the patient. Ample time for discussion and questions allowed. The patient understood, was satisfied, and agreed to proceed. #5. Schedule screening colonoscopy.The nature of the procedure, as well as the risks, benefits, and alternatives were carefully and thoroughly reviewed with the patient. Ample time for discussion and questions allowed. The patient understood, was satisfied, and agreed to proceed. #6. Preparation H for hemorrhoids #7. Probiotic align. One daily for 2-4 weeks #8. If issues with upper abdominal pain recur, then strongly urged to be reevaluated by general surgery for consideration of laparoscopic cholecystectomy

## 2015-06-29 NOTE — Patient Instructions (Signed)
You have been scheduled for an endoscopy and colonoscopy. Please follow the written instructions given to you at your visit today. Please pick up your prep supplies at the pharmacy within the next 1-3 days. If you use inhalers (even only as needed), please bring them with you on the day of your procedure.  We have sent the following medications to your pharmacy for you to pick up at your convenience: [place] taking Over the counter Probiotic (Align) daily to help with gas relief.

## 2015-07-07 ENCOUNTER — Telehealth: Payer: Self-pay | Admitting: Lab

## 2015-07-07 NOTE — Telephone Encounter (Signed)
Called pt-major labs done so pt given an appmt with Dr. Linus Salmons for 07/22/2015 @ 925 am-PCP informed- LM for Seth Bake

## 2015-07-21 MED FILL — LOSARTAN POTASSIUM 100 MG T: 100 | 34 days supply | Qty: 34 | Fill #4

## 2015-07-21 MED FILL — VIT D2 1.25 MG (50,000 UNIT: 1.25 MG | 28 days supply | Qty: 4 | Fill #1

## 2015-07-21 MED FILL — AMLODIPINE BESYLATE 5 MG TA: 5 | 34 days supply | Qty: 34 | Fill #4

## 2015-07-22 ENCOUNTER — Ambulatory Visit (INDEPENDENT_AMBULATORY_CARE_PROVIDER_SITE_OTHER): Payer: BLUE CROSS/BLUE SHIELD | Admitting: Internal Medicine

## 2015-07-22 ENCOUNTER — Encounter: Payer: Self-pay | Admitting: Internal Medicine

## 2015-07-22 VITALS — BP 162/92 | HR 103 | Temp 98.3°F | Ht 61.0 in | Wt 161.0 lb

## 2015-07-22 DIAGNOSIS — B182 Chronic viral hepatitis C: Secondary | ICD-10-CM | POA: Diagnosis not present

## 2015-07-22 DIAGNOSIS — Z8619 Personal history of other infectious and parasitic diseases: Secondary | ICD-10-CM | POA: Insufficient documentation

## 2015-07-22 MED ORDER — LEDIPASVIR-SOFOSBUVIR 90-400 MG PO TABS: 1.0000 | ORAL_TABLET | Freq: Every day | ORAL | Status: DC

## 2015-07-22 NOTE — Patient Instructions (Signed)
Date 07/22/2015  Dear Ms Doy Mince, As discussed in the [facility], your hepatitis C therapy will include the following medications:          Harvoni 90mg /400mg  tablet:           Take 1 tablet by mouth once daily   Please note that ALL MEDICATIONS WILL START ON THE SAME DATE for a total of 12 weeks. ---------------------------------------------------------------- Your HCV Treatment Start Date: TBA   Your HCV genotype:  1b    Liver Fibrosis: TBD    ---------------------------------------------------------------- YOUR PHARMACY CONTACT:   [facility] [facility] [place] Phone: [phone] Hours: Monday to Friday 7:30 am to 6:00 pm   Please always contact your pharmacy at least 3-4 business days before you run out of medications to ensure your next month's medication is ready or 1 week prior to running out if you receive it by mail.  Remember, each prescription is for 28 days. ---------------------------------------------------------------- GENERAL NOTES REGARDING YOUR HEPATITIS C MEDICATION:  SOFOSBUVIR/LEDIPASVIR (HARVONI): - Harvoni tablet is taken daily with OR without food. - The tablets are orange. - The tablets should be stored at room temperature.  - Acid reducing agents such as H2 blockers (ie. Pepcid (famotidine), Zantac (ranitidine), Tagamet (cimetidine), Axid (nizatidine) and proton pump inhibitors (ie. Prilosec (omeprazole), Protonix (pantoprazole), Nexium (esomeprazole), or Aciphex (rabeprazole)) can decrease effectiveness of Harvoni. Do not take until you have discussed with a health care provider.    -Antacids that contain magnesium and/or aluminum hydroxide (ie. Milk of Magensia, Rolaids, Gaviscon, Maalox, Mylanta, an dArthritis Pain Formula)can reduce absorption of Harvoni, so take them at least 4 hours before or after Harvoni.  -Calcium carbonate (calcium supplements or antacids such as Tums, Caltrate,  Os-Cal)needs to be taken at least 4 hours hours before or after Harvoni.  -St. John's wort or any products that contain St. John's wort like some herbal supplements  Please inform the office prior to starting any of these medications.  - The common side effects associated with Harvoni include:      1. Fatigue      2. Headache      3. Nausea      4. Diarrhea      5. Insomnia  Please note that this only lists the most common side effects and is NOT a comprehensive list of the potential side effects of these medications. For more information, please review the drug information sheets that come with your medication package from the pharmacy.  ---------------------------------------------------------------- GENERAL HELPFUL HINTS ON HCV THERAPY: 1. Stay well-hydrated. 2. Notify the [facility] of any changes in your other over-the-counter/herbal or prescription medications. 3. If you miss a dose of your medication, take the missed dose as soon as you remember. Return to your regular time/dose schedule the next day.  4.  Do not stop taking your medications without first talking with your healthcare provider. 5.  You may take Tylenol (acetaminophen), as long as the dose is less than 2000 mg (OR no more than 4 tablets of the Tylenol Extra Strengths 500mg  tablet) in 24 hours. 6.  You will see our pharmacist-specialist within the first 2 weeks of starting your medication. 7.  You will need to obtain routine labs around week 4 and12 weeks after starting and then 3 to 6 months after finishing Harvoni.    Scharlene Gloss, [place] [facility] [place],  [place]  [id number] [phone]

## 2015-07-22 NOTE — Progress Notes (Signed)
[facility]   CC: consideration for treatment for chronic hepatitis C  HPI:  +Anna [name] is a 65 y.o. female who presents for initial evaluation and management of chronic hepatitis C.  Patient tested positive this year in routine evaluation. Hepatitis C-associated risk factors present are: none. Patient denies intranasal drug use, IV drug abuse, multiple sexual partners, renal dialysis, sexual contact with person with liver disease, tattoos. Patient has had other studies performed. Results: hepatitis C RNA by PCR, result: positive. Patient has not had prior treatment for Hepatitis C. Patient does not have a past history of liver disease. Patient does not have a family history of liver disease. Patient does not  have associated signs or symptoms related to liver disease.  Labs reviewed and confirm chronic hepatitis C with a positive viral load.   Records reviewed from Dr. Garwin Brothers, genotype 1b, viral load 5.1 million.  AST 47, ALT 46.    Getting colonoscopy in February.       Patient does not have documented immunity to Hepatitis A. Patient does not have documented immunity to Hepatitis B.    Review of Systems:   Constitutional: negative for fatigue and malaise Gastrointestinal: negative for nausea and diarrhea Musculoskeletal: negative for myalgias and arthralgias All other systems reviewed and are negative      Past Medical History  Diagnosis Date  . Hypertension   . Atypical chest pain     Negative nuclear stress test  . Cholelithiasis     Single stone  . Esophageal stricture   . Hematuria, microscopic   . GERD (gastroesophageal reflux disease)   . Rhinitis     seasonal  . Cholelithiasis   . Hemorrhoids, internal     and external  . Hiatal hernia     EGD 07-2008  . Vitamin D deficiency     Prior to Admission medications   Medication Sig Start Date End Date Taking? Authorizing Provider  amLODipine (NORVASC) 5 MG tablet Take 1 tablet (5 mg  total) by mouth daily. 03/13/15  Yes Hendricks Limes, MD  diclofenac sodium (VOLTAREN) 1 % GEL Apply 2 g topically 4 (four) times daily.   Yes Historical Provider, MD  losartan (COZAAR) 100 MG tablet Take 1 tablet (100 mg total) by mouth daily. 03/13/15  Yes Hendricks Limes, MD  Vitamin D, Ergocalciferol, (DRISDOL) 50000 UNITS CAPS capsule Take 1 capsule by mouth once a week. 06/22/15  Yes Historical Provider, MD  Ledipasvir-Sofosbuvir (HARVONI) 90-400 MG TABS Take 1 tablet by mouth daily. 07/22/15   Thayer Headings, MD    Allergies  Allergen Reactions  . Tramadol     SEVERE HEADACHES     Social History  Substance Use Topics  . Smoking status: Never Smoker   . Smokeless tobacco: Never Used  . Alcohol Use: No     Comment:   occasionally    Family History  Problem Relation Age of Onset  . Hypertension Mother   . Parkinsonism Mother   . Arthritis Mother     OA  . Prostate cancer Father   . Hypertension Sister   . Hypertension Brother   . Prostate cancer Brother   . Hypertension Maternal Grandmother   . Hypertension Sister   . Hypertension Sister   . Diabetes Neg Hx   . Stroke Neg Hx   . Heart attack Sister 52    smoker  No history of cirrhosis, no liver cancer   Objective:  Constitutional: in  no apparent distress and alert,  Filed Vitals:   07/22/15 0903  BP: 162/92  Pulse: 103  Temp: 98.3 F (36.8 C)   Eyes: anicteric Cardiovascular: Cor RRR and No murmurs Respiratory: CTA B; normal respiratory effort Gastrointestinal: Bowel sounds are normal, liver is not enlarged, spleen is not enlarged Musculoskeletal: peripheral pulses normal, no pedal edema, no clubbing or cyanosis Skin: negative for - jaundice, spider hemangioma, telangiectasia, palmar erythema, ecchymosis and atrophy; no porphyria cutanea tarda Lymphatic: no cervical lymphadenopathy   Laboratory Genotype: No results found for: HCVGENOTYPE HCV viral load: No results found for: HCVQUANT Lab Results    Component Value Date   WBC 7.4 03/12/2015   HGB 14.3 03/12/2015   HCT 42.3 03/12/2015   MCV 89.1 03/12/2015   PLT 247.0 03/12/2015    Lab Results  Component Value Date   CREATININE 0.93 02/16/2015   BUN 10 02/16/2015   NA 140 02/16/2015   K 4.0 02/16/2015   CL 102 02/16/2015   CO2 26 02/16/2015    Lab Results  Component Value Date   ALT 35 03/12/2015   AST 33 03/12/2015   ALKPHOS 59 03/12/2015     Labs and history reviewed and show CHILD-PUGH A  5-6 points: Child class A 7-9 points: Child class B 10-15 points: Child class C  Lab Results  Component Value Date   BILITOT 0.5 03/12/2015   ALBUMIN 4.2 03/12/2015     Assessment: New Patient with Chronic Hepatitis C genotype 1b, untreated.  I discussed with the patient the lab findings that confirm chronic hepatitis C as well as the natural history and progression of disease including about 30% of people who develop cirrhosis of the liver if left untreated and once cirrhosis is established there is a 2-7% risk per year of liver cancer and liver failure.  I discussed the importance of treatment and benefits in reducing the risk, even if significant liver fibrosis exists.   Plan: 1) Patient counseled extensively on limiting acetaminophen to no more than 2 grams daily, avoidance of alcohol. 2) Transmission discussed with patient including sexual transmission, sharing razors and toothbrush.   3) Will need referral to gastroenterology if concern for cirrhosis 4) Will need referral for substance abuse counseling: No.; Further work up to include urine drug screen  No. 5) Will prescribe Harvoni for 12 weeks 6) Hepatitis A vaccine Yes.   patient though refused both 7) Hepatitis B vaccine Yes.   8) Pneumovax vaccine if concern for cirrhosis 9) Further work up to include liver staging with elastography 10) will follow up after elastography

## 2015-08-06 ENCOUNTER — Ambulatory Visit: Payer: BLUE CROSS/BLUE SHIELD | Admitting: Skilled Nursing Facility1

## 2015-08-06 ENCOUNTER — Ambulatory Visit (HOSPITAL_COMMUNITY)
Admission: RE | Admit: 2015-08-06 | Discharge: 2015-08-06 | Disposition: A | Discharge status: Home / Self Care | Payer: BLUE CROSS/BLUE SHIELD | Source: Ambulatory Visit | Attending: Internal Medicine | Admitting: Internal Medicine

## 2015-08-06 DIAGNOSIS — B182 Chronic viral hepatitis C: Secondary | ICD-10-CM | POA: Diagnosis present

## 2015-08-06 DIAGNOSIS — K8020 Calculus of gallbladder without cholecystitis without obstruction: Secondary | ICD-10-CM | POA: Insufficient documentation

## 2015-08-18 ENCOUNTER — Other Ambulatory Visit: Payer: Self-pay | Admitting: Pharmacist

## 2015-08-18 ENCOUNTER — Encounter: Payer: Self-pay | Admitting: Pharmacist

## 2015-08-18 MED ORDER — LEDIPASVIR-SOFOSBUVIR 90-400 MG PO TABS: 1.0000 | ORAL_TABLET | Freq: Every day | ORAL | Status: DC

## 2015-08-19 ENCOUNTER — Encounter: Payer: Self-pay | Admitting: Internal Medicine

## 2015-08-19 ENCOUNTER — Other Ambulatory Visit: Payer: Self-pay | Admitting: Pharmacist Clinician (PhC)/ Clinical Pharmacy Specialist

## 2015-08-19 ENCOUNTER — Ambulatory Visit (AMBULATORY_SURGERY_CENTER): Payer: BLUE CROSS/BLUE SHIELD | Admitting: Internal Medicine

## 2015-08-19 VITALS — BP 126/79 | HR 70 | Temp 97.2°F | Resp 20 | Ht 61.75 in | Wt 163.0 lb

## 2015-08-19 DIAGNOSIS — R1013 Epigastric pain: Secondary | ICD-10-CM

## 2015-08-19 DIAGNOSIS — D122 Benign neoplasm of ascending colon: Secondary | ICD-10-CM

## 2015-08-19 DIAGNOSIS — Z1211 Encounter for screening for malignant neoplasm of colon: Secondary | ICD-10-CM

## 2015-08-19 DIAGNOSIS — K2100 Gastro-esophageal reflux disease with esophagitis, without bleeding: Secondary | ICD-10-CM

## 2015-08-19 DIAGNOSIS — K635 Polyp of colon: Secondary | ICD-10-CM | POA: Diagnosis not present

## 2015-08-19 MED ORDER — LEDIPASVIR-SOFOSBUVIR 90-400 MG PO TABS: 1.0000 | ORAL_TABLET | Freq: Every day | ORAL | Status: DC

## 2015-08-19 MED ORDER — OMEPRAZOLE 40 MG PO CPDR: 40.0000 mg | DELAYED_RELEASE_CAPSULE | Freq: Every day | ORAL | Status: DC

## 2015-08-19 MED ORDER — SODIUM CHLORIDE 0.9 % IV SOLN: 500.0000 mL | INTRAVENOUS | Status: DC

## 2015-08-19 MED FILL — OMEPRAZOLE DR 40 MG CAPSULE: 40 | 30 days supply | Qty: 30 | Fill #0

## 2015-08-19 NOTE — Progress Notes (Signed)
Called to room to assist during endoscopic procedure.  Patient ID and intended procedure confirmed with present staff. Received instructions for my participation in the procedure from the performing physician.  

## 2015-08-19 NOTE — Patient Instructions (Signed)

## 2015-08-19 NOTE — Op Note (Signed)
[facility]  [address] [place], [address]   COLONOSCOPY PROCEDURE REPORT  PATIENT: Anna [name], Anna [name]  MR#: [id number] BIRTHDATE: Apr 03, 1951 , 15  yrs. old GENDER: female ENDOSCOPIST: Eustace Quail, MD REFERRED [id number] Linna Darner, M.D. PROCEDURE DATE:  08/19/2015 PROCEDURE:   Colonoscopy, screening and Colonoscopy with snare polypectomy X1 First Screening Colonoscopy - Avg.  risk and is 50 yrs.  old or older - No.  Prior Negative Screening - Now for repeat screening. 10 or more years since last screening  History of Adenoma - Now for follow-up colonoscopy & has been > or = to 3 yrs.  N/A  Polyps removed today? Yes ASA CLASS:   Class II INDICATIONS:Screening for colonic neoplasia and Colorectal Neoplasm Risk Assessment for this procedure is average risk.. Index examination January 2007 (negative) MEDICATIONS: Propofol 150 mg IV and Monitored anesthesia care  DESCRIPTION OF PROCEDURE:   After the risks benefits and alternatives of the procedure were thoroughly explained, informed consent was obtained.  The digital rectal exam revealed no abnormalities of the rectum.   The LB [id number]  endoscope was introduced through the anus and advanced to the cecum, which was identified by both the appendix and ileocecal valve. No adverse events experienced.   The quality of the prep was excellent. (Suprep was used)  The instrument was then slowly withdrawn as the colon was fully examined. Estimated blood loss is zero unless otherwise noted in this procedure report.  COLON FINDINGS: A single polyp measuring 1 mm in size was found in the ascending colon.  A polypectomy was performed with a cold snare.  The resection was complete, the polyp tissue was completely retrieved and sent to histology.   There was a rare scattered diverticulum in the right colon.   The examination was otherwise normal.  Retroflexed views revealed internal hemorrhoids. The time to cecum = 5.4  Withdrawal time = 9.3   The scope was withdrawn and the procedure completed. COMPLICATIONS: There were no immediate complications.  ENDOSCOPIC IMPRESSION: 1.   Single polyp was found in the ascending colon; polypectomy was performed with a cold snare 2.   A rare scattered diverticulum in the right colon 3.   The examination was otherwise normal  RECOMMENDATIONS: 1.  Repeat colonoscopy in 5 years if polyp adenomatous; otherwise 10 years 2.  Upper endoscopy today (please see report)  eSigned:  Eustace Quail, MD 08/19/2015 1:43 PM   cc: The Patient and Hendricks Limes, MD

## 2015-08-19 NOTE — Progress Notes (Signed)
Dental advisory given to patient 

## 2015-08-19 NOTE — Op Note (Signed)
[facility]  [address]. [place], [address]   ENDOSCOPY PROCEDURE REPORT  PATIENT: Anna [name], Anna [name]  MR#: [id number] BIRTHDATE: May 20, 1951 , 45  yrs. old GENDER: female ENDOSCOPIST: Eustace Quail, MD REFERRED BY:  .  Self / Office PROCEDURE DATE:  08/19/2015 PROCEDURE:  EGD, diagnostic ASA CLASS:     Class II INDICATIONS:  epigastric pain. MEDICATIONS: Monitored anesthesia care and Propofol 100 mg IV TOPICAL ANESTHETIC: none  DESCRIPTION OF PROCEDURE: After the risks benefits and alternatives of the procedure were thoroughly explained, informed consent was obtained.  The LB [id number] endoscope was introduced through the mouth and advanced to the second portion of the duodenum , Without limitations.  The instrument was slowly withdrawn as the mucosa was fully examined.  EXAM:The esophagus revealed peptic stricture at the gastroesophageal junction with associated esophagitis (friability and edema). Stomach was normal.  The duodenum was normal.  Retroflexed views revealed a hiatal hernia.     The scope was then withdrawn from the patient and the procedure completed.  COMPLICATIONS: There were no immediate complications.  ENDOSCOPIC IMPRESSION: 1. GERD with esophagitis and peptic stricture 2. Cholelithiasis  RECOMMENDATIONS: 1.  Anti-reflux regimen to be followed 2.  Prescribe omeprazole 40 mg daily; #30; 1 by mouth daily; 11 refills 3.  Call office next 2-3 days to schedule an office appointment with Dr. Henrene Pastor in 3 months. We will see if staying on omeprazole daily as resolved issues with abdominal pain or not.  REPEAT EXAM:  eSigned:  Eustace Quail, MD 08/19/2015 1:50 PM    CC:The Patient and Hendricks Limes, MD

## 2015-08-20 ENCOUNTER — Telehealth: Payer: Self-pay | Admitting: Pharmacist Clinician (PhC)/ Clinical Pharmacy Specialist

## 2015-08-20 ENCOUNTER — Telehealth: Payer: Self-pay | Admitting: *Deleted

## 2015-08-20 ENCOUNTER — Telehealth: Payer: Self-pay | Admitting: Internal Medicine

## 2015-08-20 ENCOUNTER — Other Ambulatory Visit: Payer: Self-pay | Admitting: Pharmacist Clinician (PhC)/ Clinical Pharmacy Specialist

## 2015-08-20 MED FILL — AMLODIPINE BESYLATE 5 MG TA: 5 | 34 days supply | Qty: 34 | Fill #5

## 2015-08-20 MED FILL — VIT D2 1.25 MG (50,000 UNIT: 1.25 MG | 28 days supply | Qty: 4 | Fill #2

## 2015-08-20 MED FILL — LOSARTAN POTASSIUM 100 MG T: 100 | 34 days supply | Qty: 34 | Fill #5

## 2015-08-20 NOTE — Telephone Encounter (Signed)
  Follow up Call-  Call back number 08/19/2015  Post procedure Call Back phone  # [phone]  Permission to leave phone message Yes     Patient questions:  Do you have a fever, pain , or abdominal swelling? No. Pain Score  0 *  Have you tolerated food without any problems? Yes.    Have you been able to return to your normal activities? Yes.    Do you have any questions about your discharge instructions: Diet   No. Medications  No. Follow up visit  No.  Do you have questions or concerns about your Care? No.  Actions: * If pain score is 4 or above: No action needed, pain <4.

## 2015-08-20 NOTE — Telephone Encounter (Signed)
Anna [name] called about the new med that was just prescribed for her by GI. Apparently, she recently got an EGD for the esophagitis. She was placed on 40mg  of prilosec. She has not started harvoni yet. I explained to her the issue with the interaction and the option that she has. Call GI back to see if 20mg  is feasible or wait and try to zepatier instead. She is going to call GI back and go that route.

## 2015-08-20 NOTE — Telephone Encounter (Signed)
Patient was told by pharmacist that 40mg  of Omeprazole would lower the efficacy of the Harvoni she is about to start.  I told her I would check with Dr. Henrene Pastor and call her back with his recommendation.  Patient agreed.

## 2015-08-24 ENCOUNTER — Other Ambulatory Visit: Payer: Self-pay | Admitting: Pharmacist Clinician (PhC)/ Clinical Pharmacy Specialist

## 2015-08-24 DIAGNOSIS — K2100 Gastro-esophageal reflux disease with esophagitis, without bleeding: Secondary | ICD-10-CM

## 2015-08-24 MED ORDER — OMEPRAZOLE 20 MG PO CPDR: 20.0000 mg | DELAYED_RELEASE_CAPSULE | Freq: Every day | ORAL | Status: DC

## 2015-08-24 MED FILL — OMEPRAZOLE DR 20 MG CAPSULE: 20 | 30 days supply | Qty: 30 | Fill #0

## 2015-08-24 NOTE — Telephone Encounter (Signed)
Called Anna [name] to tell her that we are going to reduce the Prilosec to 20mg  qday and OK with Dr. Henrene Pastor.

## 2015-08-25 ENCOUNTER — Telehealth: Payer: Self-pay

## 2015-08-25 ENCOUNTER — Encounter: Payer: Self-pay | Admitting: Internal Medicine

## 2015-08-25 ENCOUNTER — Encounter: Payer: Self-pay | Admitting: Pharmacy Technician

## 2015-08-25 NOTE — Telephone Encounter (Signed)
-----   Message from Irene Shipper, MD sent at 08/21/2015 12:45 PM EST ----- 20 mg would be fine. I let her ID doctor know already.  ----- Message -----    From: Audrea Muscat, CMA    Sent: 08/20/2015   4:31 PM      To: Irene Shipper, MD  Patient was told by her pharmacist that the Omeprazole 40mg  she was prescribed after her procedure would lower the efficacy of the Harvoni she is about to start.  Please advise.  Thanks.

## 2015-09-03 ENCOUNTER — Ambulatory Visit (INDEPENDENT_AMBULATORY_CARE_PROVIDER_SITE_OTHER): Payer: BLUE CROSS/BLUE SHIELD | Admitting: Internal Medicine

## 2015-09-03 ENCOUNTER — Encounter: Payer: Self-pay | Admitting: Internal Medicine

## 2015-09-03 VITALS — BP 150/100 | HR 64 | Temp 97.9°F | Ht 62.0 in | Wt 163.0 lb

## 2015-09-03 DIAGNOSIS — K7400 Hepatic fibrosis, unspecified: Secondary | ICD-10-CM | POA: Insufficient documentation

## 2015-09-03 DIAGNOSIS — K209 Esophagitis, unspecified without bleeding: Secondary | ICD-10-CM

## 2015-09-03 DIAGNOSIS — B182 Chronic viral hepatitis C: Secondary | ICD-10-CM | POA: Diagnosis not present

## 2015-09-03 HISTORY — DX: Hepatic fibrosis, unspecified: K74.00

## 2015-09-03 NOTE — Assessment & Plan Note (Signed)
Discussed results and avoidance of alcohol. Repeat in 1 year.

## 2015-09-03 NOTE — Assessment & Plan Note (Signed)
Doing well on 20 mg ppi.

## 2015-09-03 NOTE — Progress Notes (Signed)
   Subjective:    Patient ID: Anna [name], female    DOB: 11/03/1950, 65 y.o.   MRN: [id number]  HPI Here for follow up of HCV.  Has genotype 1b, viral load of 5.1 million and mild transaminitis to 47/46.  Never treated.  Now has started on Harvoni about 1 week ago and tolerating well.  Elastography with F2/3.  On ppi after recent visit to GI and on 20 mg daily in place of the 40 mg.  Takes both on empty stomach in the am together.     Review of Systems  Constitutional: Negative for fatigue.  Gastrointestinal: Negative for diarrhea.  Skin: Negative for rash.       Objective:   Physical Exam  Constitutional: She appears well-developed and well-nourished. No distress.  Eyes: No scleral icterus.  Skin: No rash noted.          Assessment & Plan:

## 2015-09-03 NOTE — Assessment & Plan Note (Signed)
Doing well on Harvoni Labs in about 3 weeks and follow up with me after that

## 2015-09-15 MED FILL — AMLODIPINE BESYLATE 5 MG TA: 5 | 34 days supply | Qty: 34 | Fill #6

## 2015-09-15 MED FILL — OMEPRAZOLE DR 20 MG CAPSULE: 20 | 30 days supply | Qty: 30 | Fill #1

## 2015-09-15 MED FILL — VIT D2 1.25 MG (50,000 UNIT: 1.25 MG | 28 days supply | Qty: 4 | Fill #3

## 2015-09-15 MED FILL — LOSARTAN POTASSIUM 100 MG T: 100 | 34 days supply | Qty: 34 | Fill #6

## 2015-09-22 ENCOUNTER — Other Ambulatory Visit: Payer: BLUE CROSS/BLUE SHIELD

## 2015-09-22 DIAGNOSIS — B182 Chronic viral hepatitis C: Secondary | ICD-10-CM

## 2015-09-23 LAB — HEPATITIS C RNA QUANTITATIVE: HCV QUANT: NOT DETECTED [IU]/mL (ref <15)

## 2015-09-29 ENCOUNTER — Encounter: Payer: Self-pay | Admitting: Internal Medicine

## 2015-09-29 ENCOUNTER — Ambulatory Visit (INDEPENDENT_AMBULATORY_CARE_PROVIDER_SITE_OTHER): Payer: BLUE CROSS/BLUE SHIELD | Admitting: Internal Medicine

## 2015-09-29 VITALS — BP 168/91 | Temp 98.0°F | Wt 161.0 lb

## 2015-09-29 DIAGNOSIS — K7400 Hepatic fibrosis, unspecified: Secondary | ICD-10-CM

## 2015-09-29 DIAGNOSIS — B182 Chronic viral hepatitis C: Secondary | ICD-10-CM | POA: Diagnosis not present

## 2015-09-29 NOTE — Assessment & Plan Note (Signed)
Offered and she again refused hepatitis A and B vaccine.

## 2015-09-29 NOTE — Progress Notes (Signed)
   Subjective:    Patient ID: Anna [name], female    DOB: [birth year], 65 y.o.   MRN: [id number]  HPI Here for follow up of HCV.  Has genotype 1b, viral load of 5.1 million and mild transaminitis to 47/46.  Never previously treated.  Now has started on Harvoni about 5-6 weeks ago and tolerating well.  Elastography with F2/3.  On ppi after recent visit to GI and on 20 mg daily in place of the 40 mg.  Takes both on empty stomach in the am together.  Still with same abdominal pain/discomfort.     Review of Systems  Constitutional: Negative for fatigue.  Gastrointestinal: Negative for diarrhea.  Skin: Negative for rash.       Objective:   Physical Exam  Constitutional: She appears well-developed and well-nourished. No distress.  Eyes: No scleral icterus.  Cardiovascular: Normal rate, regular rhythm and normal heart sounds.   No murmur heard. Pulmonary/Chest: Effort normal and breath sounds normal. No respiratory distress.  Skin: No rash noted.          Assessment & Plan:

## 2015-09-29 NOTE — Assessment & Plan Note (Signed)
Doing well. Early viral load undetectable.  Will see after treatment completion.  RTC 2 months.

## 2015-10-19 MED FILL — AMLODIPINE BESYLATE 5 MG TA: 5 | 32 days supply | Qty: 32 | Fill #7

## 2015-10-19 MED FILL — OMEPRAZOLE DR 20 MG CAPSULE: 20 | 30 days supply | Qty: 30 | Fill #2

## 2015-10-19 MED FILL — LOSARTAN POTASSIUM 100 MG T: 100 | 32 days supply | Qty: 32 | Fill #7

## 2015-11-12 ENCOUNTER — Other Ambulatory Visit: Payer: BLUE CROSS/BLUE SHIELD

## 2015-11-12 DIAGNOSIS — B182 Chronic viral hepatitis C: Secondary | ICD-10-CM

## 2015-11-12 LAB — COMPLETE METABOLIC PANEL WITH GFR
ALBUMIN: 4.4 g/dL (ref 3.6–5.1)
ALT: 22 U/L (ref 6–29)
AST: 26 U/L (ref 10–35)
Alkaline Phosphatase: 56 U/L (ref 33–130)
BUN: 8 mg/dL (ref 7–25)
CALCIUM: 9.9 mg/dL (ref 8.6–10.4)
CO2: 24 mmol/L (ref 20–31)
CREATININE: 1.3 mg/dL — AB (ref 0.50–0.99)
Chloride: 99 mmol/L (ref 98–110)
GFR, EST NON AFRICAN AMERICAN: 44 mL/min — AB (ref >=60)
GFR, Est African American: 50 mL/min — ABNORMAL LOW (ref >=60)
Glucose, Bld: 101 mg/dL — ABNORMAL HIGH (ref 65–99)
POTASSIUM: 4.4 mmol/L (ref 3.5–5.3)
Sodium: 135 mmol/L (ref 135–146)
TOTAL PROTEIN: 7.6 g/dL (ref 6.1–8.1)
Total Bilirubin: 0.8 mg/dL (ref 0.2–1.2)

## 2015-11-13 LAB — HEPATITIS C RNA QUANTITATIVE: HCV Quantitative: NOT DETECTED IU/mL (ref <15)

## 2015-11-16 ENCOUNTER — Ambulatory Visit: Payer: BLUE CROSS/BLUE SHIELD | Admitting: Internal Medicine

## 2015-11-16 ENCOUNTER — Encounter: Payer: Self-pay | Admitting: Internal Medicine

## 2015-11-16 ENCOUNTER — Ambulatory Visit (INDEPENDENT_AMBULATORY_CARE_PROVIDER_SITE_OTHER): Payer: BLUE CROSS/BLUE SHIELD | Admitting: Internal Medicine

## 2015-11-16 VITALS — BP 140/80 | HR 80 | Ht 61.75 in | Wt 164.3 lb

## 2015-11-16 DIAGNOSIS — K8020 Calculus of gallbladder without cholecystitis without obstruction: Secondary | ICD-10-CM

## 2015-11-16 DIAGNOSIS — R109 Unspecified abdominal pain: Secondary | ICD-10-CM | POA: Diagnosis not present

## 2015-11-16 DIAGNOSIS — K219 Gastro-esophageal reflux disease without esophagitis: Secondary | ICD-10-CM

## 2015-11-16 MED ORDER — OMEPRAZOLE 40 MG PO CPDR: 40.0000 mg | DELAYED_RELEASE_CAPSULE | Freq: Every day | ORAL | Status: DC

## 2015-11-16 MED FILL — OMEPRAZOLE DR 40 MG CAPSULE: 40 | 30 days supply | Qty: 30 | Fill #0

## 2015-11-16 NOTE — Patient Instructions (Signed)
We have sent the following medications to your pharmacy for you to pick up at your convenience:  Omeprazole   [facility] 12/03/2015 at 9:45am, arrival time of 9:15am;  Phone number [phone]

## 2015-11-16 NOTE — Progress Notes (Signed)
HISTORY OF PRESENT ILLNESS:  Anna [name] is a 65 y.o. female that I evaluated in the office 06/29/2015 for intermittent upper abdominal discomfort, reflux, abdominal bloating, and symptomatic hemorrhoids. Her upper abdominal discomfort was felt likely due to either known cholelithiasis (for which she had undergone prior surgical evaluation) or reflux disease. Upper endoscopy performed 08/19/2015 revealed active esophagitis and peptic stricture. Colonoscopy that same day revealed a non-adenomatous polyp and rare diverticula. She was prescribed omeprazole 40 mg daily. Her infectious disease doctor decrease this to 20 mg daily with concurrent Harvoni. The patient finished both of those therapies yesterday. She reports that while on PPI she had no change in her epigastric discomfort. As a matter of fact, this is more frequent and postprandial. She describes it as a dull ache. No vomiting. She does have some bloating. No active reflux symptoms currently. No dysphagia  REVIEW OF SYSTEMS:  All non-GI ROS negative except for sinus and allergy  Past Medical History  Diagnosis Date  . Hypertension   . Atypical chest pain     Negative nuclear stress test  . Cholelithiasis     Single stone  . Esophageal stricture   . Hematuria, microscopic   . GERD (gastroesophageal reflux disease)   . Rhinitis     seasonal  . Cholelithiasis   . Hemorrhoids, internal     and external  . Hiatal hernia     EGD 07-2008  . Vitamin D deficiency   . Arthritis     hands  . Hepatitis C 06/2015    Past Surgical History  Procedure Laterality Date  . Appendectomy    . Abdominal hysterectomy      fibroids &dysfunctional menes; [facility]  . Colonoscopy  2007    Dr. Henrene Pastor, due 2017  . Ct angiogram  01/2006    Negative for PE  . G2 p1    . Refractive surgery    . Upper gi endoscopy  07/2010    Esophagitis; distal esophageal ringlike structure    Social History [name]  reports that she has never smoked. She has  never used smokeless tobacco. She reports that she does not drink alcohol or use illicit drugs.  family history includes Arthritis in her mother; Heart attack (age of onset: 6) in her sister; Hypertension in her brother, maternal grandmother, mother, sister, sister, and sister; Parkinsonism in her mother; Prostate cancer in her brother and father. There is no history of Diabetes, Stroke, Colon cancer, or Stomach cancer.  Allergies  Allergen Reactions  . Tramadol     SEVERE HEADACHES        PHYSICAL EXAMINATION: Vital signs: BP 140/80 mmHg  Pulse 80  Ht 5' 1.75" (1.568 m)  Wt 164 lb 4.8 oz (74.526 kg)  BMI 30.31 kg/m2  Constitutional: generally well-appearing, no acute distress Psychiatric: alert and oriented x3, cooperative Eyes: extraocular movements intact, anicteric, conjunctiva pink Mouth: oral pharynx moist, no lesions Neck: supple no lymphadenopathy Cardiovascular: heart regular rate and rhythm, no murmur Lungs: clear to auscultation bilaterally Abdomen: soft, nontender, nondistended, no obvious ascites, no peritoneal signs, normal bowel sounds, no organomegaly Rectal: Deferred Extremities: no clubbing cyanosis or lower extremity edema bilaterally Skin: no lesions on visible extremities Neuro: No focal deficits. Cranial nerves intact  ASSESSMENT:  #1. Postprandial upper abdominal discomfort as described. Suspect this may be related to known cholelithiasis. No improvement on PPI #2. GERD with endoscopic evidence of esophagitis and esophageal stricture #3. Recent colonoscopy without neoplasia   PLAN:  #1. Reflux  precautions #2. Prescribed omeprazole 40 mg daily #3. Refer back to general surgery regarding upper abdominal discomfort and cholelithiasis #4. Screening colonoscopy 10 years #5. Routine GI follow-up one year

## 2015-11-18 ENCOUNTER — Other Ambulatory Visit: Payer: Self-pay | Admitting: Internal Medicine

## 2015-12-01 ENCOUNTER — Encounter: Payer: Self-pay | Admitting: Internal Medicine

## 2015-12-01 ENCOUNTER — Ambulatory Visit (INDEPENDENT_AMBULATORY_CARE_PROVIDER_SITE_OTHER): Payer: BLUE CROSS/BLUE SHIELD | Admitting: Internal Medicine

## 2015-12-01 VITALS — BP 187/97 | HR 80 | Temp 98.3°F | Wt 169.0 lb

## 2015-12-01 DIAGNOSIS — B182 Chronic viral hepatitis C: Secondary | ICD-10-CM

## 2015-12-01 DIAGNOSIS — K7400 Hepatic fibrosis, unspecified: Secondary | ICD-10-CM

## 2015-12-01 NOTE — Assessment & Plan Note (Signed)
Doing great.  RTC 3 months for SVR12.

## 2015-12-01 NOTE — Assessment & Plan Note (Signed)
Will repeat elastography next year.

## 2015-12-01 NOTE — Progress Notes (Signed)
   Subjective:    Patient ID: Anna [name], female    DOB: Jun 28, 1951, 65 y.o.   MRN: [id number]  HPI Here for follow up of HCV.  Has genotype 1b, viral load of 5.1 million and mild transaminitis to 47/46.  Never previously treated.  Took Harvoni and now completed.  Elastography with F2/3.  On ppi after recent visit to GI and on 20 mg daily in place of the 40 mg.  Refuses Vaccines.  After treatment viral load undetectable.     Review of Systems  Constitutional: Negative for fatigue.  Gastrointestinal: Negative for diarrhea.  Skin: Negative for rash.       Objective:   Physical Exam  Constitutional: She appears well-developed and well-nourished. No distress.  Eyes: No scleral icterus.  Cardiovascular: Normal rate, regular rhythm and normal heart sounds.   No murmur heard. Pulmonary/Chest: Effort normal and breath sounds normal. No respiratory distress.  Skin: No rash noted.          Assessment & Plan:

## 2015-12-15 ENCOUNTER — Ambulatory Visit: Payer: Self-pay | Admitting: Surgery

## 2016-01-27 MED FILL — OMEPRAZOLE DR 40 MG CAPSULE: 40 | 30 days supply | Qty: 30 | Fill #1

## 2016-02-02 ENCOUNTER — Encounter (HOSPITAL_COMMUNITY): Payer: Self-pay

## 2016-02-02 ENCOUNTER — Encounter (HOSPITAL_COMMUNITY): Payer: Self-pay | Admitting: Surgery

## 2016-02-02 ENCOUNTER — Encounter (HOSPITAL_COMMUNITY)
Admission: RE | Admit: 2016-02-02 | Discharge: 2016-02-02 | Disposition: A | Discharge status: Home / Self Care | Payer: BLUE CROSS/BLUE SHIELD | Source: Ambulatory Visit | Attending: Surgery | Admitting: Surgery

## 2016-02-02 DIAGNOSIS — K8010 Calculus of gallbladder with chronic cholecystitis without obstruction: Secondary | ICD-10-CM | POA: Diagnosis present

## 2016-02-02 DIAGNOSIS — Z8249 Family history of ischemic heart disease and other diseases of the circulatory system: Secondary | ICD-10-CM | POA: Diagnosis not present

## 2016-02-02 DIAGNOSIS — K219 Gastro-esophageal reflux disease without esophagitis: Secondary | ICD-10-CM | POA: Diagnosis not present

## 2016-02-02 DIAGNOSIS — K8000 Calculus of gallbladder with acute cholecystitis without obstruction: Secondary | ICD-10-CM | POA: Diagnosis present

## 2016-02-02 DIAGNOSIS — M1990 Unspecified osteoarthritis, unspecified site: Secondary | ICD-10-CM | POA: Diagnosis not present

## 2016-02-02 DIAGNOSIS — I10 Essential (primary) hypertension: Secondary | ICD-10-CM | POA: Diagnosis not present

## 2016-02-02 DIAGNOSIS — Z79899 Other long term (current) drug therapy: Secondary | ICD-10-CM | POA: Diagnosis not present

## 2016-02-02 DIAGNOSIS — Z411 Encounter for cosmetic surgery: Secondary | ICD-10-CM | POA: Diagnosis not present

## 2016-02-02 HISTORY — DX: Calculus of gallbladder with chronic cholecystitis without obstruction: K80.10

## 2016-02-02 HISTORY — DX: Cardiac arrhythmia, unspecified: I49.9

## 2016-02-02 HISTORY — DX: Family history of other specified conditions: Z84.89

## 2016-02-02 LAB — COMPREHENSIVE METABOLIC PANEL
ALBUMIN: 4.5 g/dL (ref 3.5–5.0)
ALT: 16 U/L (ref 14–54)
ANION GAP: 5 (ref 5–15)
AST: 22 U/L (ref 15–41)
Alkaline Phosphatase: 55 U/L (ref 38–126)
BILIRUBIN TOTAL: 0.7 mg/dL (ref 0.3–1.2)
BUN: 8 mg/dL (ref 6–20)
CHLORIDE: 107 mmol/L (ref 101–111)
CO2: 27 mmol/L (ref 22–32)
Calcium: 9.2 mg/dL (ref 8.9–10.3)
Creatinine, Ser: 0.97 mg/dL (ref 0.44–1.00)
GFR calc Af Amer: >60 mL/min (ref >60)
GLUCOSE: 90 mg/dL (ref 65–99)
POTASSIUM: 4 mmol/L (ref 3.5–5.1)
Sodium: 139 mmol/L (ref 135–145)
TOTAL PROTEIN: 8 g/dL (ref 6.5–8.1)

## 2016-02-02 LAB — CBC
HEMATOCRIT: 41.9 % (ref 36.0–46.0)
HEMOGLOBIN: 13.9 g/dL (ref 12.0–15.0)
MCH: 29.2 pg (ref 26.0–34.0)
MCHC: 33.2 g/dL (ref 30.0–36.0)
MCV: 88 fL (ref 78.0–100.0)
Platelets: 245 10*3/uL (ref 150–400)
RBC: 4.76 MIL/uL (ref 3.87–5.11)
RDW: 13.3 % (ref 11.5–15.5)
WBC: 7.1 10*3/uL (ref 4.0–10.5)

## 2016-02-02 LAB — PROTIME-INR
INR: 1.03 (ref 0.00–1.49)
Prothrombin Time: 13.7 seconds (ref 11.6–15.2)

## 2016-02-02 NOTE — Patient Instructions (Signed)
Anna [name]  02/02/2016   Your procedure is scheduled on: Thursday February 04, 2016  Report to [facility] Main  Entrance take [place]  elevators to 3rd floor to  [place] at 6:45 AM.  Call this number if you have problems the morning of surgery [phone]   Remember: ONLY 1 PERSON MAY GO WITH YOU TO SHORT STAY TO GET  READY MORNING OF [place].  Do not eat food or drink liquids :After Midnight.     Take these medicines the morning of surgery with A SIP OF WATER: Amlodipine; Omeprazole; May use eye drops if needed (bring bottles with you day of surgery)                            You may not have any metal on your body including hair pins and              piercings  Do not wear jewelry, make-up, lotions, powders or perfumes, deodorant             Do not wear nail polish.  Do not shave  48 hours prior to surgery.                 Do not bring valuables to the hospital. [place].  Contacts, dentures or bridgework may not be worn into surgery.  Leave suitcase in the car. After surgery it may be brought to your room.             _____________________________________________________________________             [facility] - Preparing for Surgery Before surgery, you can play an important role.  Because skin is not sterile, your skin needs to be as free of germs as possible.  You can reduce the number of germs on your skin by washing with CHG (chlorahexidine gluconate) soap before surgery.  CHG is an antiseptic cleaner which kills germs and bonds with the skin to continue killing germs even after washing. Please DO NOT use if you have an allergy to CHG or antibacterial soaps.  If your skin becomes reddened/irritated stop using the CHG and inform your nurse when you arrive at Short Stay. Do not shave (including legs and underarms) for at least 48 hours prior to the first CHG shower.  You may shave your  face/neck. Please follow these instructions carefully:  1.  Shower with CHG Soap the night before surgery and the  morning of Surgery.  2.  If you choose to wash your hair, wash your hair first as usual with your  normal  shampoo.  3.  After you shampoo, rinse your hair and body thoroughly to remove the  shampoo.                           4.  Use CHG as you would any other liquid soap.  You can apply chg directly  to the skin and wash                       Gently with a scrungie or clean washcloth.  5.  Apply the CHG Soap to your body ONLY FROM THE NECK DOWN.  Do not use on face/ open                           Wound or open sores. Avoid contact with eyes, ears mouth and genitals (private parts).                       Wash face,  Genitals (private parts) with your normal soap.             6.  Wash thoroughly, paying special attention to the area where your surgery  will be performed.  7.  Thoroughly rinse your body with warm water from the neck down.  8.  DO NOT shower/wash with your normal soap after using and rinsing off  the CHG Soap.                9.  Pat yourself dry with a clean towel.            10.  Wear clean pajamas.            11.  Place clean sheets on your bed the night of your first shower and do not  sleep with pets. Day of Surgery : Do not apply any lotions/deodorants the morning of surgery.  Please wear clean clothes to the hospital/surgery center.  FAILURE TO FOLLOW THESE INSTRUCTIONS MAY RESULT IN THE CANCELLATION OF YOUR SURGERY PATIENT SIGNATURE_________________________________  NURSE SIGNATURE__________________________________  ________________________________________________________________________

## 2016-02-02 NOTE — Progress Notes (Signed)
Abbreviations noted per surgical consent. Please clarify. Thanks.

## 2016-02-02 NOTE — H&P (Signed)
General Surgery [facility]  Anna [name] DOB: [birth year] Single / Language: Cleophus Molt / Race: White Female  History of Present Illness Patient words: gallstones.  The patient is a 65 year old female who presents for evaluation of gall stones.  Patient is referred by Dr. Scarlette Shorts for evaluation of symptomatic cholelithiasis and probable chronic cholecystitis. Patient has had a known history of gallstones for several years. She is followed for hepatitis C. Recent ultrasound in January 2017 showed a solitary 2.0 cm gallstone. There is no biliary dilatation. Liver function test have been intermittently mildly elevated which may be related to her underlying hepatitis or may be related to transient choledocholithiasis. Previous abdominal surgery includes only appendectomy. Patient notes postprandial epigastric abdominal pain. She has mild nausea. She denies fevers or chills. She has had frequent loose bowel movements recently but denies acholic stools or jaundice. There is no family history of gallbladder disease.   Other Problems Arthritis Cholelithiasis Hemorrhoids Hepatitis High blood pressure  Past Surgical History Appendectomy Colon Polyp Removal - Colonoscopy Hysterectomy (not due to cancer) - Partial  Diagnostic Studies History Colonoscopy within last year Mammogram within last year Pap Smear 1-5 years ago  Allergies TraMADol HCl ER *ANALGESICS - OPIOID* Nausea, Vomiting.  Medication History  AmLODIPine Besylate (5MG  Tablet, Oral) Active. Losartan Potassium (100MG  Tablet, Oral) Active. Omeprazole (40MG  Capsule DR, Oral) Active. Medications Reconciled  Social History  Alcohol use Occasional alcohol use. Caffeine use Carbonated beverages, Coffee, Tea. No drug use Tobacco use Never smoker.  Family History Arthritis Mother, Sister. Heart disease in female family member before age 62 Hypertension Brother, Mother,  Sister. Prostate Cancer Father.  Pregnancy / Birth History Age at menarche 74 years. Age of menopause 1-62 Gravida 2 Maternal age 45-25 Para 1  Review of Systems General Present- Night Sweats and Weight Gain. Not Present- Appetite Loss, Chills, Fatigue, Fever and Weight Loss. Skin Not Present- Change in Wart/Mole, Dryness, Hives, Jaundice, New Lesions, Non-Healing Wounds, Rash and Ulcer. HEENT Present- Ringing in the Ears, Seasonal Allergies and Visual Disturbances. Not Present- Earache, Hearing Loss, Hoarseness, Nose Bleed, Oral Ulcers, Sinus Pain, Sore Throat, Wears glasses/contact lenses and Yellow Eyes. Respiratory Not Present- Bloody sputum, Chronic Cough, Difficulty Breathing, Snoring and Wheezing. Breast Not Present- Breast Mass, Breast Pain, Nipple Discharge and Skin Changes. Cardiovascular Not Present- Chest Pain, Difficulty Breathing Lying Down, Leg Cramps, Palpitations, Rapid Heart Rate, Shortness of Breath and Swelling of Extremities. Gastrointestinal Present- Abdominal Pain, Change in Bowel Habits, Excessive gas, Gets full quickly at meals and Hemorrhoids. Not Present- Bloating, Bloody Stool, Chronic diarrhea, Constipation, Difficulty Swallowing, Indigestion, Nausea, Rectal Pain and Vomiting. Female Genitourinary Not Present- Frequency, Nocturia, Painful Urination, Pelvic Pain and Urgency. Musculoskeletal Present- Joint Pain. Not Present- Back Pain, Joint Stiffness, Muscle Pain, Muscle Weakness and Swelling of Extremities. Neurological Not Present- Decreased Memory, Fainting, Headaches, Numbness, Seizures, Tingling, Tremor, Trouble walking and Weakness. Psychiatric Not Present- Anxiety, Bipolar, Change in Sleep Pattern, Depression, Fearful and Frequent crying. Endocrine Present- Hot flashes. Not Present- Cold Intolerance, Excessive Hunger, Hair Changes, Heat Intolerance and New Diabetes. Hematology Not Present- Easy Bruising, Excessive bleeding, Gland problems, HIV and  Persistent Infections.  Vitals Weight: 164.2 lb Height: 61in Body Surface Area: 1.74 m Body Mass Index: 31.02 kg/m  Temp.: 98.54F(Oral)  Pulse: 83 (Regular)  BP: 136/84 (Sitting, Left Arm, Standard)  Physical Exam  General - appears comfortable, no distress; not diaphorectic  HEENT - normocephalic; sclerae clear, gaze conjugate; mucous membranes moist, dentition good; voice normal  Neck - symmetric on extension; no palpable anterior or posterior cervical adenopathy; no palpable masses in the thyroid bed  Chest - clear bilaterally without rhonchi, rales, or wheeze  Cor - regular rhythm with normal rate; no significant murmur  Abd - soft without distension  Ext - non-tender without significant edema or lymphedema  Neuro - grossly intact; no tremor   Assessment & Plan  CHRONIC CHOLECYSTITIS WITH CALCULUS (K80.10)  Pt Education - Pamphlet Given - Laparoscopic Gallbladder Surgery: discussed with patient and provided information.  Patient presents with symptomatic cholelithiasis on referral from her gastroenterologist. Patient is provided with written literature on gallbladder surgery to review at home.  Patient has long-standing history of cholelithiasis with intermittent postprandial abdominal pain. She has intermittent elevation in her liver function test. She has a known history of hepatitis C. I have recommended proceeding with laparoscopic cholecystectomy in the near future. We have discussed the risk and benefits of the procedure. We have discussed the possibility of open surgery. We have discussed the hospital stay and her postoperative recovery. Patient understands and wishes to proceed with surgery in the near future.  The risks and benefits of the procedure have been discussed at length with the patient. The patient understands the proposed procedure, potential alternative treatments, and the course of recovery to be expected. All of the patient's questions  have been answered at this time. The patient wishes to proceed with surgery.  Earnstine Regal, MD, [facility] Office: [phone]

## 2016-02-04 ENCOUNTER — Observation Stay (HOSPITAL_COMMUNITY)
Admission: RE | Admit: 2016-02-04 | Discharge: 2016-02-05 | Disposition: A | Discharge status: Home / Self Care | Payer: BLUE CROSS/BLUE SHIELD | Source: Ambulatory Visit | Attending: Surgery | Admitting: Surgery

## 2016-02-04 ENCOUNTER — Encounter (HOSPITAL_COMMUNITY)
Admission: RE | Disposition: A | Discharge status: Home / Self Care | Payer: Self-pay | Source: Ambulatory Visit | Attending: Surgery

## 2016-02-04 ENCOUNTER — Ambulatory Visit (HOSPITAL_COMMUNITY): Payer: BLUE CROSS/BLUE SHIELD | Admitting: Registered Nurse

## 2016-02-04 ENCOUNTER — Encounter (HOSPITAL_COMMUNITY): Payer: Self-pay

## 2016-02-04 ENCOUNTER — Ambulatory Visit (HOSPITAL_COMMUNITY): Payer: BLUE CROSS/BLUE SHIELD

## 2016-02-04 DIAGNOSIS — I10 Essential (primary) hypertension: Secondary | ICD-10-CM | POA: Insufficient documentation

## 2016-02-04 DIAGNOSIS — M1990 Unspecified osteoarthritis, unspecified site: Secondary | ICD-10-CM | POA: Insufficient documentation

## 2016-02-04 DIAGNOSIS — K8010 Calculus of gallbladder with chronic cholecystitis without obstruction: Principal | ICD-10-CM | POA: Diagnosis present

## 2016-02-04 DIAGNOSIS — Z419 Encounter for procedure for purposes other than remedying health state, unspecified: Secondary | ICD-10-CM

## 2016-02-04 DIAGNOSIS — Z8249 Family history of ischemic heart disease and other diseases of the circulatory system: Secondary | ICD-10-CM | POA: Insufficient documentation

## 2016-02-04 DIAGNOSIS — K219 Gastro-esophageal reflux disease without esophagitis: Secondary | ICD-10-CM | POA: Insufficient documentation

## 2016-02-04 DIAGNOSIS — Z79899 Other long term (current) drug therapy: Secondary | ICD-10-CM | POA: Insufficient documentation

## 2016-02-04 HISTORY — PX: CHOLECYSTECTOMY: SHX55

## 2016-02-04 SURGERY — LAPAROSCOPIC CHOLECYSTECTOMY WITH INTRAOPERATIVE CHOLANGIOGRAM
Anesthesia: General

## 2016-02-04 MED ORDER — ONDANSETRON 4 MG PO TBDP: 4.0000 mg | ORAL_TABLET | Freq: Four times a day (QID) | ORAL | Status: DC | PRN

## 2016-02-04 MED ORDER — BUPIVACAINE-EPINEPHRINE (PF) 0.5% -1:200000 IJ SOLN
INTRAMUSCULAR | Status: AC
  Filled 2016-02-04: qty 30

## 2016-02-04 MED ORDER — SUGAMMADEX SODIUM 500 MG/5ML IV SOLN
INTRAVENOUS | Status: AC
  Filled 2016-02-04: qty 5

## 2016-02-04 MED ORDER — ONDANSETRON HCL 4 MG/2ML IJ SOLN
INTRAMUSCULAR | Status: AC
  Filled 2016-02-04: qty 2

## 2016-02-04 MED ORDER — ROCURONIUM BROMIDE 100 MG/10ML IV SOLN
INTRAVENOUS | Status: AC
  Filled 2016-02-04: qty 1

## 2016-02-04 MED ORDER — LIDOCAINE HCL (CARDIAC) 20 MG/ML IV SOLN
INTRAVENOUS | Status: AC
  Filled 2016-02-04: qty 5

## 2016-02-04 MED ORDER — IOPAMIDOL (ISOVUE-300) INJECTION 61%
INTRAVENOUS | Status: AC
  Filled 2016-02-04: qty 50

## 2016-02-04 MED ORDER — LOSARTAN POTASSIUM 50 MG PO TABS
100.0000 mg | ORAL_TABLET | Freq: Every day | ORAL | Status: DC
  Administered 2016-02-04 – 2016-02-05 (×2): 100 mg via ORAL
  Filled 2016-02-04 (×2): qty 2

## 2016-02-04 MED ORDER — MIDAZOLAM HCL 5 MG/5ML IJ SOLN
INTRAMUSCULAR | Status: DC | PRN
  Administered 2016-02-04: 2 mg via INTRAVENOUS

## 2016-02-04 MED ORDER — MIDAZOLAM HCL 2 MG/2ML IJ SOLN
INTRAMUSCULAR | Status: AC
  Filled 2016-02-04: qty 2

## 2016-02-04 MED ORDER — 0.9 % SODIUM CHLORIDE (POUR BTL) OPTIME
TOPICAL | Status: DC | PRN
Start: 2016-02-04 — End: 2016-02-04
  Administered 2016-02-04: 1000 mL

## 2016-02-04 MED ORDER — HYDROMORPHONE HCL 2 MG/ML IJ SOLN
INTRAMUSCULAR | Status: AC
Start: 2016-02-04 — End: 2016-02-04
  Filled 2016-02-04: qty 1

## 2016-02-04 MED ORDER — KCL IN DEXTROSE-NACL 20-5-0.45 MEQ/L-%-% IV SOLN
INTRAVENOUS | Status: DC
  Administered 2016-02-04 (×2): via INTRAVENOUS
  Filled 2016-02-04 (×3): qty 1000

## 2016-02-04 MED ORDER — HYDROCODONE-ACETAMINOPHEN 5-325 MG PO TABS
1.0000 | ORAL_TABLET | ORAL | Status: DC | PRN
  Administered 2016-02-04: 1 via ORAL
  Administered 2016-02-04: 2 via ORAL
  Filled 2016-02-04 (×2): qty 2

## 2016-02-04 MED ORDER — DEXAMETHASONE SODIUM PHOSPHATE 10 MG/ML IJ SOLN
INTRAMUSCULAR | Status: DC | PRN
  Administered 2016-02-04: 10 mg via INTRAVENOUS

## 2016-02-04 MED ORDER — PROPOFOL 10 MG/ML IV BOLUS
INTRAVENOUS | Status: DC | PRN
  Administered 2016-02-04: 150 mg via INTRAVENOUS

## 2016-02-04 MED ORDER — LACTATED RINGERS IV SOLN
INTRAVENOUS | Status: DC
  Administered 2016-02-04: 09:00:00 via INTRAVENOUS
  Administered 2016-02-04: 1000 mL via INTRAVENOUS

## 2016-02-04 MED ORDER — SUGAMMADEX SODIUM 200 MG/2ML IV SOLN
INTRAVENOUS | Status: DC | PRN
  Administered 2016-02-04: 200 mg via INTRAVENOUS

## 2016-02-04 MED ORDER — LABETALOL HCL 5 MG/ML IV SOLN
INTRAVENOUS | Status: AC
  Filled 2016-02-04: qty 4

## 2016-02-04 MED ORDER — FENTANYL CITRATE (PF) 100 MCG/2ML IJ SOLN: 25.0000 ug | INTRAMUSCULAR | Status: DC | PRN

## 2016-02-04 MED ORDER — ONDANSETRON HCL 4 MG/2ML IJ SOLN: 4.0000 mg | Freq: Four times a day (QID) | INTRAMUSCULAR | Status: DC | PRN

## 2016-02-04 MED ORDER — ACETAMINOPHEN 325 MG PO TABS: 650.0000 mg | ORAL_TABLET | Freq: Four times a day (QID) | ORAL | Status: DC | PRN

## 2016-02-04 MED ORDER — AMLODIPINE BESYLATE 5 MG PO TABS
5.0000 mg | ORAL_TABLET | Freq: Every day | ORAL | Status: DC
  Administered 2016-02-05: 5 mg via ORAL
  Filled 2016-02-04: qty 1

## 2016-02-04 MED ORDER — PROPOFOL 10 MG/ML IV BOLUS
INTRAVENOUS | Status: AC
  Filled 2016-02-04: qty 20

## 2016-02-04 MED ORDER — ACETAMINOPHEN 650 MG RE SUPP: 650.0000 mg | Freq: Four times a day (QID) | RECTAL | Status: DC | PRN

## 2016-02-04 MED ORDER — LIDOCAINE HCL (CARDIAC) 20 MG/ML IV SOLN
INTRAVENOUS | Status: DC | PRN
  Administered 2016-02-04: 100 mg via INTRAVENOUS

## 2016-02-04 MED ORDER — FENTANYL CITRATE (PF) 250 MCG/5ML IJ SOLN
INTRAMUSCULAR | Status: AC
  Filled 2016-02-04: qty 5

## 2016-02-04 MED ORDER — CEFAZOLIN SODIUM-DEXTROSE 2-4 GM/100ML-% IV SOLN
2.0000 g | INTRAVENOUS | Status: AC
  Administered 2016-02-04: 2 g via INTRAVENOUS
  Filled 2016-02-04: qty 100

## 2016-02-04 MED ORDER — ROCURONIUM BROMIDE 100 MG/10ML IV SOLN
INTRAVENOUS | Status: DC | PRN
Start: 2016-02-04 — End: 2016-02-04
  Administered 2016-02-04: 50 mg via INTRAVENOUS

## 2016-02-04 MED ORDER — ONDANSETRON HCL 4 MG/2ML IJ SOLN
INTRAMUSCULAR | Status: DC | PRN
  Administered 2016-02-04: 4 mg via INTRAVENOUS

## 2016-02-04 MED ORDER — DEXAMETHASONE SODIUM PHOSPHATE 10 MG/ML IJ SOLN
INTRAMUSCULAR | Status: AC
  Filled 2016-02-04: qty 1

## 2016-02-04 MED ORDER — BUPIVACAINE-EPINEPHRINE 0.5% -1:200000 IJ SOLN
INTRAMUSCULAR | Status: DC | PRN
  Administered 2016-02-04: 20 mL

## 2016-02-04 MED ORDER — PANTOPRAZOLE SODIUM 40 MG PO TBEC
40.0000 mg | DELAYED_RELEASE_TABLET | Freq: Every day | ORAL | Status: DC
Start: 2016-02-04 — End: 2016-02-05
  Administered 2016-02-04 – 2016-02-05 (×2): 40 mg via ORAL
  Filled 2016-02-04 (×2): qty 1

## 2016-02-04 MED ORDER — HYDROMORPHONE HCL 1 MG/ML IJ SOLN
1.0000 mg | INTRAMUSCULAR | Status: DC | PRN
  Administered 2016-02-05: 1 mg via INTRAVENOUS
  Filled 2016-02-04: qty 1

## 2016-02-04 MED ORDER — LACTATED RINGERS IR SOLN
Status: DC | PRN
  Administered 2016-02-04: 500 mL

## 2016-02-04 MED ORDER — HYDROMORPHONE HCL 1 MG/ML IJ SOLN
INTRAMUSCULAR | Status: DC | PRN
  Administered 2016-02-04: 2 mg via INTRAVENOUS

## 2016-02-04 MED ORDER — CEFAZOLIN SODIUM-DEXTROSE 2-4 GM/100ML-% IV SOLN
INTRAVENOUS | Status: AC
  Filled 2016-02-04: qty 100

## 2016-02-04 MED ORDER — ONDANSETRON HCL 4 MG/2ML IJ SOLN: 4.0000 mg | Freq: Once | INTRAMUSCULAR | Status: DC | PRN

## 2016-02-04 SURGICAL SUPPLY — 38 items
APL SKNCLS STERI-STRIP NONHPOA (GAUZE/BANDAGES/DRESSINGS) ×1
APPLIER CLIP ROT 10 11.4 M/L (STAPLE) ×3
APR CLP MED LRG 11.4X10 (STAPLE) ×1
BAG SPEC RTRVL LRG 6X4 10 (ENDOMECHANICALS) ×1
BENZOIN TINCTURE PRP APPL 2/3 (GAUZE/BANDAGES/DRESSINGS) ×3 IMPLANT
CABLE HIGH FREQUENCY MONO STRZ (ELECTRODE) ×3 IMPLANT
CHLORAPREP W/TINT 26ML (MISCELLANEOUS) ×3 IMPLANT
CLIP APPLIE ROT 10 11.4 M/L (STAPLE) ×1 IMPLANT
CLOSURE STERI-STRIP 1/4X4 (GAUZE/BANDAGES/DRESSINGS) ×2 IMPLANT
CLOSURE WOUND 1/2 X4 (GAUZE/BANDAGES/DRESSINGS)
COVER MAYO STAND STRL (DRAPES) ×3 IMPLANT
COVER SURGICAL LIGHT HANDLE (MISCELLANEOUS) ×1 IMPLANT
DECANTER SPIKE VIAL GLASS SM (MISCELLANEOUS) ×3 IMPLANT
DRAPE C-ARM 42X120 X-RAY (DRAPES) ×1 IMPLANT
DRAPE LAPAROSCOPIC ABDOMINAL (DRAPES) ×3 IMPLANT
ELECT REM PT RETURN 9FT ADLT (ELECTROSURGICAL) ×3
ELECTRODE REM PT RTRN 9FT ADLT (ELECTROSURGICAL) ×1 IMPLANT
GAUZE SPONGE 2X2 8PLY STRL LF (GAUZE/BANDAGES/DRESSINGS) ×1 IMPLANT
GLOVE SURG ORTHO 8.0 STRL STRW (GLOVE) ×3 IMPLANT
GOWN STRL REUS W/TWL XL LVL3 (GOWN DISPOSABLE) ×6 IMPLANT
HEMOSTAT SURGICEL 4X8 (HEMOSTASIS) IMPLANT
IRRIG SUCT STRYKERFLOW 2 WTIP (MISCELLANEOUS) ×3
IRRIGATION SUCT STRKRFLW 2 WTP (MISCELLANEOUS) ×1 IMPLANT
KIT BASIN OR (CUSTOM PROCEDURE TRAY) ×3 IMPLANT
POUCH SPECIMEN RETRIEVAL 10MM (ENDOMECHANICALS) ×3 IMPLANT
SCISSORS LAP 5X35 DISP (ENDOMECHANICALS) ×3 IMPLANT
SET CHOLANGIOGRAPH MIX (MISCELLANEOUS) ×1 IMPLANT
SLEEVE XCEL OPT CAN 5 100 (ENDOMECHANICALS) ×3 IMPLANT
SPONGE GAUZE 2X2 STER 10/PKG (GAUZE/BANDAGES/DRESSINGS) ×2
STRIP CLOSURE SKIN 1/2X4 (GAUZE/BANDAGES/DRESSINGS) ×1 IMPLANT
SUT MNCRL AB 4-0 PS2 18 (SUTURE) ×3 IMPLANT
TOWEL OR 17X26 10 PK STRL BLUE (TOWEL DISPOSABLE) ×3 IMPLANT
TOWEL OR NON WOVEN STRL DISP B (DISPOSABLE) ×3 IMPLANT
TRAY LAPAROSCOPIC (CUSTOM PROCEDURE TRAY) ×3 IMPLANT
TROCAR BLADELESS OPT 5 100 (ENDOMECHANICALS) ×3 IMPLANT
TROCAR XCEL BLUNT TIP 100MML (ENDOMECHANICALS) ×3 IMPLANT
TROCAR XCEL NON-BLD 11X100MML (ENDOMECHANICALS) ×3 IMPLANT
TUBING INSUF HEATED (TUBING) ×2 IMPLANT

## 2016-02-04 NOTE — Interval H&P Note (Signed)
History and Physical Interval Note:  02/04/2016 8:17 AM  Anna [name]  has presented today for surgery, with the diagnosis of chronic cholecystitis and cholelithiasis.   The various methods of treatment have been discussed with the patient and family. After consideration of risks, benefits and other options for treatment, the patient has consented to    Procedure(s): LAPAROSCOPIC CHOLECYSTECTOMY WITH INTRAOPERATIVE CHOLANGIOGRAM (N/A) as a surgical intervention .    The patient's history has been reviewed, patient examined, no change in status, stable for surgery.  I have reviewed the patient's chart and labs.  Questions were answered to the patient's satisfaction.    Earnstine Regal, MD, [facility] Office: [place]

## 2016-02-04 NOTE — Anesthesia Preprocedure Evaluation (Signed)
Anesthesia Evaluation  Patient identified by MRN, date of birth, ID band Patient awake    Reviewed: Allergy & Precautions, NPO status , Patient's Chart, lab work & pertinent test results  History of Anesthesia Complications (+) Family history of anesthesia reaction and history of anesthetic complications  Airway Mallampati: III  TM Distance: >3 FB Neck ROM: Full    Dental  (+) Teeth Intact, Dental Advisory Given   Pulmonary neg pulmonary ROS,    Pulmonary exam normal breath sounds clear to auscultation       Cardiovascular hypertension, Pt. on medications Normal cardiovascular exam Rhythm:Regular Rate:Normal     Neuro/Psych  Neuromuscular disease negative psych ROS   GI/Hepatic GERD  Medicated and Controlled,(+) Hepatitis -, C  Endo/Other  Obesity   Renal/GU negative Renal ROS     Musculoskeletal  (+) Arthritis , Osteoarthritis,    Abdominal   Peds  Hematology negative hematology ROS (+)   Anesthesia Other Findings Day of surgery medications reviewed with the patient.  Reproductive/Obstetrics                             Anesthesia Physical Anesthesia Plan  ASA: III  Anesthesia Plan: General   Post-op Pain Management:    Induction: Intravenous  Airway Management Planned: Oral ETT  Additional Equipment:   Intra-op Plan:   Post-operative Plan: Extubation in OR  Informed Consent: I have reviewed the patients History and Physical, chart, labs and discussed the procedure including the risks, benefits and alternatives for the proposed anesthesia with the patient or authorized representative who has indicated his/her understanding and acceptance.   Dental advisory given  Plan Discussed with: CRNA  Anesthesia Plan Comments: (Risks/benefits of general anesthesia discussed with patient including risk of damage to teeth, lips, gum, and tongue, nausea/vomiting, allergic reactions to  medications, and the possibility of heart attack, stroke and death.  All patient questions answered.  Patient wishes to proceed.)        Anesthesia Quick Evaluation

## 2016-02-04 NOTE — Op Note (Signed)
Procedure Note  Pre-operative Diagnosis:  Chronic cholecystitis, cholelithiasis  Post-operative Diagnosis:  same  Surgeon:  Earnstine Regal, MD, FACS  Assistant:  Nedra Hai, MD, FACS   Procedure:  Laparoscopic cholecystectomy  Anesthesia:  General  Estimated Blood Loss:  minimal  Drains: none         Specimen: Gallbladder to pathology  Indications:  The patient is a 65 year old female who presents for evaluation of gall stones.  Patient is referred by Dr. Scarlette Shorts for evaluation of symptomatic cholelithiasis and probable chronic cholecystitis. Patient has had a known history of gallstones for several years. She is followed for hepatitis C. Recent ultrasound in January 2017 showed a solitary 2.0 cm gallstone. There is no biliary dilatation. Liver function test have been intermittently mildly elevated which may be related to her underlying hepatitis or may be related to transient choledocholithiasis. Previous abdominal surgery includes only appendectomy. Patient notes postprandial epigastric abdominal pain.   Procedure Details:  The patient was seen in the pre-op holding area. The risks, benefits, complications, treatment options, and expected outcomes were previously discussed with the patient. The patient agreed with the proposed plan and has signed the informed consent form.  The patient was brought to the Operating Room, identified as Anna [name] and the procedure verified as laparoscopic cholecystectomy with intraoperative cholangiography. A "time out" was completed and the above information confirmed.  The patient was placed in the supine position. Following induction of general anesthesia, the abdomen was prepped and draped in the usual aseptic fashion.  An incision was made in the skin near the umbilicus. The midline fascia was incised and the peritoneal cavity was entered and a Hasson canula was introduced under direct vision.  The Hasson canula was secured with a  0-Vicryl pursestring suture. Pneumoperitoneum was established with carbon dioxide. Additional trocars were introduced under direct vision along the right costal margin in the midline, mid-clavicular line, and anterior axillary line.   The gallbladder was identified and the fundus grasped and retracted cephalad. Adhesions were taken down bluntly and the electrocautery was utilized as needed, taking care not to injure any adjacent structures. The infundibulum was grasped and retracted laterally, exposing the peritoneum overlying the triangle of Calot. The peritoneum was incised and structures exposed with blunt dissection. The cystic duct was clearly identified, bluntly dissected circumferentially, and clipped at the neck of the gallbladder.  The cystic duct was then ligated with surgical clips and divided. The cystic artery was identified, dissected circumferentially, ligated with ligaclips, and divided.  The gallbladder was dissected away from the liver bed using the electrocautery for hemostasis. The gallbladder was completely removed from the liver and placed into an endocatch bag. The gallbladder was removed in the endocatch bag through the umbilical port site and submitted to pathology for review.  The right upper quadrant was irrigated and the gallbladder bed was inspected. Hemostasis was achieved with the electrocautery.  Pneumoperitoneum was released after viewing removal of the trocars with good hemostasis noted. The umbilical wound was irrigated and the fascia was then closed with the pursestring suture.  Local anesthetic was infiltrated at all port sites. The skin incisions were closed with 4-0 Monocril subcuticular sutures and steri-strips and dressings were applied.  Instrument, sponge, and needle counts were correct at the conclusion of the case.  The patient was awakened from anesthesia and brought to the recovery room in stable condition.  The patient tolerated the procedure  well.   Earnstine Regal, MD, [facility]  [facility] Office: [phone]

## 2016-02-04 NOTE — Transfer of Care (Signed)
Immediate Anesthesia Transfer of Care Note  Patient: [name]  Procedure(s) Performed: Procedure(s): LAPAROSCOPIC CHOLECYSTECTOMY  (N/A)  Patient Location: PACU  Anesthesia Type:General  Level of Consciousness: awake, alert  and oriented  Airway & Oxygen Therapy: Patient Spontanous Breathing and Patient connected to face mask oxygen  Post-op Assessment: Report given to RN and Post -op Vital signs reviewed and stable  Post vital signs: Reviewed and stable  Last Vitals:  Filed Vitals:   02/04/16 0700  BP: 166/88  Pulse: 72  Temp: 37.2 C  Resp: 16    Last Pain:  Filed Vitals:   02/04/16 0747  PainSc: 3       Patients Stated Pain Goal: 4 ([id number])  Complications: No apparent anesthesia complications

## 2016-02-04 NOTE — Anesthesia Procedure Notes (Signed)
Procedure Name: Intubation Date/Time: 02/04/2016 8:46 AM Performed by: Chandra Batch A Pre-anesthesia Checklist: Patient identified, Emergency Drugs available, Suction available, Patient being monitored and Timeout performed Patient Re-evaluated:Patient Re-evaluated prior to inductionOxygen Delivery Method: Circle system utilized Preoxygenation: Pre-oxygenation with 100% oxygen Intubation Type: IV induction Ventilation: Mask ventilation without difficulty Laryngoscope Size: Glidescope and 3 Grade View: Grade III Tube type: Oral Tube size: 7.5 mm Number of attempts: 2 (1st attempt with Mac 3 blade, grade 3 view. Unable to place ETT. @nd  attempt with Glidescope. ETT advanced without resistance or difficulty.) Airway Equipment and Method: Stylet Placement Confirmation: ETT inserted through vocal cords under direct vision,  positive ETCO2 and breath sounds checked- equal and bilateral Secured at: 21 cm Tube secured with: Tape Dental Injury: Teeth and Oropharynx as per pre-operative assessment  Difficulty Due To: Difficult Airway- due to limited oral opening and Difficult Airway- due to reduced neck mobility

## 2016-02-04 NOTE — Anesthesia Postprocedure Evaluation (Signed)
Anesthesia Post Note  Patient: Evvie Mesic  Procedure(s) Performed: Procedure(s) (LRB): LAPAROSCOPIC CHOLECYSTECTOMY  (N/A)  Patient location during evaluation: PACU Anesthesia Type: General Level of consciousness: awake and alert Pain management: pain level controlled Vital Signs Assessment: post-procedure vital signs reviewed and stable Respiratory status: spontaneous breathing, nonlabored ventilation, respiratory function stable and patient connected to nasal cannula oxygen Cardiovascular status: blood pressure returned to baseline and stable Postop Assessment: no signs of nausea or vomiting Anesthetic complications: no    Last Vitals:  Filed Vitals:   02/04/16 1106 02/04/16 1148  BP: 130/75 116/76  Pulse: 52 55  Temp: 36.4 C 36.5 C  Resp: 15 18    Last Pain:  Filed Vitals:   02/04/16 1151  PainSc: 0-No pain                 Catalina Gravel

## 2016-02-05 ENCOUNTER — Encounter (HOSPITAL_COMMUNITY): Payer: Self-pay | Admitting: Surgery

## 2016-02-05 DIAGNOSIS — K8010 Calculus of gallbladder with chronic cholecystitis without obstruction: Secondary | ICD-10-CM | POA: Diagnosis not present

## 2016-02-05 MED ORDER — HYDROCODONE-ACETAMINOPHEN 5-325 MG PO TABS: 1.0000 | ORAL_TABLET | ORAL | Status: DC | PRN

## 2016-02-05 MED FILL — HYDROCODON-APAP 5-325: 5-325 | 2 days supply | Qty: 20 | Fill #0

## 2016-02-05 NOTE — Progress Notes (Signed)
Discharge instructions reviewed with patient, questions answered, verbalized understanding. Went over care of incisions as per discharge instructions.  Rx for pain medication given to patient. Patient awaiting ride.

## 2016-02-05 NOTE — Discharge Summary (Signed)
Physician Discharge Summary [facility]  Patient ID: Anna [name] MRN: [id number] DOB/AGE: March 06, 1951 65 y.o.  Admit date: 02/04/2016 Discharge date: 02/05/2016  Admission Diagnoses:  Chronic cholecystitis, cholelithiasis  Discharge Diagnoses:  Principal Problem:   Chronic cholecystitis with calculus Active Problems:   Cholelithiasis with chronic cholecystitis   Discharged Condition: good  Hospital Course: Patient was admitted for observation following gallbladder surgery.  Post op course was uncomplicated.  Pain was well controlled.  Tolerated diet.  Patient was prepared for discharge home on POD#1.  Consults: None  Treatments: surgery: lap cholecystectomy  Discharge Exam: Blood pressure 98/59, pulse 58, temperature 97.9 F (36.6 C), temperature source Oral, resp. rate 16, height 5' 1.5" (1.562 m), weight 75.807 kg (167 lb 2 oz), SpO2 95 %. HEENT - clear Neck - soft Chest - clear bilaterally Cor - RRR Abd - soft without distension; dressings dry and intact  Disposition: Home  Discharge Instructions    Diet - low sodium heart healthy    Complete by:  As directed      Discharge instructions    Complete by:  As directed   [facility]  LAPAROSCOPIC SURGERY:  POST-OP INSTRUCTIONS  Always review your discharge instruction sheet given to you by the facility where your surgery was performed.  A prescription for pain medication may be given to you upon discharge.  Take your pain medication as prescribed.  If narcotic pain medicine is not needed, then you may take acetaminophen (Tylenol) or ibuprofen (Advil) as needed.  Take your usually prescribed medications unless otherwise directed.  If you need a refill on your pain medication, please contact your pharmacy.  They will contact our office to request authorization. Prescriptions will not be filled after 5 P.M. or on weekends.  You should follow a light diet the first few days after  arrival home, such as soup and crackers or toast.  Be sure to include plenty of fluids daily.  Most patients will experience some swelling and bruising in the area of the incisions.  Ice packs will help.  Swelling and bruising can take several days to resolve.   It is common to experience some constipation after surgery.  Increasing fluid intake and taking a stool softener (such as Colace) will usually help or prevent this problem from occurring.  A mild laxative (Milk of Magnesia or Miralax) should be taken according to package instructions if there has been no bowel movement after 48 hours.  You will have steri-strips and a gauze dressing over your incisions.  You may remove the gauze bandage on the second day after surgery, and you may shower at that time.  Leave your steri-strips (small skin tapes) in place directly over the incision.  These strips should remain on the skin for 5-7 days and then be removed.  You may get them wet in the shower and pat them dry.  Any sutures or staples will be removed at the office during your follow-up visit.  ACTIVITIES:  You may resume regular (light) daily activities beginning the next day - such as daily self-care, walking, climbing stairs - gradually increasing activities as tolerated.  You may have sexual intercourse when it is comfortable.  Refrain from any heavy lifting or straining until approved by your doctor.  You may drive when you are no longer taking prescription pain medication, you can comfortably wear a seatbelt, and you can safely maneuver your car and apply brakes.  You should see your doctor in  the office for a follow-up appointment approximately 2-3 weeks after your surgery.  Make sure that you call for this appointment within a day or two after you arrive home to insure a convenient appointment time.  WHEN TO CALL YOUR DOCTOR: Fever over 101.0 Inability to urinate Continued bleeding from incision Increased pain, redness, or drainage from  the incision Increasing abdominal pain  The clinic staff is available to answer your questions during regular business hours.  Please don't hesitate to call and ask to speak to one of the nurses for clinical concerns.  If you have a medical emergency, go to the nearest emergency room or call 911.  A surgeon from [facility] is always on call for the hospital.  Earnstine Regal, MD, [facility] Office: [place] Free:  [place] [phone]  Website: [email or web address]     Increase activity slowly    Complete by:  As directed      Remove dressing in 24 hours    Complete by:  As directed             Medication List    TAKE these medications        acetaminophen 500 MG tablet  Commonly known as:  TYLENOL  Take 500 mg by mouth every 6 (six) hours as needed for moderate pain.     amLODipine 5 MG tablet  Commonly known as:  NORVASC  Take 1 tablet (5 mg total) by mouth daily.     diclofenac sodium 1 % Gel  Commonly known as:  VOLTAREN  Apply 2 g topically 2 (two) times daily.     HYDROcodone-acetaminophen 5-325 MG tablet  Commonly known as:  NORCO/VICODIN  Take 1-2 tablets by mouth every 4 (four) hours as needed for moderate pain.     losartan 100 MG tablet  Commonly known as:  COZAAR  Take 100 mg by mouth daily.     omeprazole 40 MG capsule  Commonly known as:  PRILOSEC  Take 1 capsule (40 mg total) by mouth daily.     SOOTHE OP  Place 1 drop into both eyes daily.     VISINE OP  Place 1 drop into both eyes daily.           Follow-up Information    Follow up with Earnstine Regal, MD. Schedule an appointment as soon as possible for a visit in 3 weeks.   Specialty:  General Surgery   Why:  For wound re-check   Contact information:   [place] [address] [phone]       Earnstine Regal, MD, [facility] Office:  [phone]   Signed: Earnstine Regal 02/05/2016, 7:38 AM

## 2016-02-05 NOTE — Progress Notes (Signed)
Patient transported via wheelchair to front of hospital to be taken home by family member.

## 2016-02-05 NOTE — Addendum Note (Signed)
Addendum  created 02/05/16 1523 by Victoriano Lain, CRNA   Modules edited: Anesthesia Medication Administration

## 2016-02-15 ENCOUNTER — Telehealth: Payer: Self-pay | Admitting: General Practice

## 2016-02-15 MED ORDER — LOSARTAN POTASSIUM 100 MG PO TABS: 100.0000 mg | ORAL_TABLET | Freq: Every day | ORAL | 1 refills | Status: DC

## 2016-02-15 MED ORDER — AMLODIPINE BESYLATE 5 MG PO TABS: 5.0000 mg | ORAL_TABLET | Freq: Every day | ORAL | 1 refills | Status: DC

## 2016-02-15 MED FILL — AMLODIPINE BESYLATE 5 MG TA: 5 | 30 days supply | Qty: 30 | Fill #0

## 2016-02-15 MED FILL — LOSARTAN POTASSIUM 100 MG T: 100 | 30 days supply | Qty: 30 | Fill #0

## 2016-02-15 NOTE — Telephone Encounter (Signed)
Pt called in to be established with a provider. (previous pt of Dr. Linna Darner) Scheduled pt with Dr. Lorelei Pont on 9/4. Pt says that she will need refills by then.   Please advised.

## 2016-02-15 NOTE — Telephone Encounter (Signed)
Medications that pt will be needing is   amLODipine and also losartan    Pharmacy: [facility], [place] [address]

## 2016-02-15 NOTE — Telephone Encounter (Signed)
Ok will do refills

## 2016-02-15 NOTE — Telephone Encounter (Signed)
Spoke with nurse in office, considering pt's concern for refills. Is provider able to work pt in sooner to be established?

## 2016-02-16 ENCOUNTER — Other Ambulatory Visit: Payer: Self-pay | Admitting: Emergency Medicine

## 2016-02-16 ENCOUNTER — Other Ambulatory Visit: Payer: BLUE CROSS/BLUE SHIELD

## 2016-02-16 DIAGNOSIS — B182 Chronic viral hepatitis C: Secondary | ICD-10-CM

## 2016-02-16 LAB — BASIC METABOLIC PANEL: GLUCOSE: 100 mg/dL

## 2016-02-16 LAB — LIPID PANEL
CHOLESTEROL: 253 mg/dL — AB (ref 0–200)
HDL: 54 mg/dL (ref 35–70)

## 2016-02-17 ENCOUNTER — Other Ambulatory Visit: Payer: BLUE CROSS/BLUE SHIELD

## 2016-02-18 LAB — HEPATITIS C RNA QUANTITATIVE: HCV QUANT: NOT DETECTED [IU]/mL (ref <15)

## 2016-03-02 ENCOUNTER — Encounter: Payer: Self-pay | Admitting: Internal Medicine

## 2016-03-02 ENCOUNTER — Ambulatory Visit (INDEPENDENT_AMBULATORY_CARE_PROVIDER_SITE_OTHER): Payer: BLUE CROSS/BLUE SHIELD | Admitting: Internal Medicine

## 2016-03-02 VITALS — Temp 98.1°F | Ht 62.0 in | Wt 163.0 lb

## 2016-03-02 DIAGNOSIS — B182 Chronic viral hepatitis C: Secondary | ICD-10-CM

## 2016-03-02 DIAGNOSIS — K7400 Hepatic fibrosis, unspecified: Secondary | ICD-10-CM

## 2016-03-02 NOTE — Assessment & Plan Note (Signed)
Will repeat elastography in 6 months to compare with the previous transaminitis but likely will not need further follow up

## 2016-03-02 NOTE — Progress Notes (Signed)
   Subjective:    Patient ID: Anna [name], female    DOB: Jul 17, 1951, 65 y.o.   MRN: [id number]  HPI Here for follow up of HCV.  Has genotype 1b, viral load of 5.1 million and mild transaminitis to 47/46.  Never previously treated.  Took Harvoni and now completed over 3 months ago.  Elastography with F2/3.  Refuses Vaccines.  After treatment and SVR 12 viral load undetectable.     Review of Systems  Constitutional: Negative for fatigue.  Gastrointestinal: Negative for diarrhea.  Skin: Negative for rash.       Objective:   Physical Exam  Constitutional: She appears well-developed and well-nourished. No distress.  Eyes: No scleral icterus.  Cardiovascular: Normal rate, regular rhythm and normal heart sounds.   No murmur heard. Pulmonary/Chest: Effort normal and breath sounds normal. No respiratory distress.  Skin: No rash noted.          Assessment & Plan:

## 2016-03-02 NOTE — Assessment & Plan Note (Signed)
Now considered cured.  Advised that antibody will always be positive, won't be able to donate blood.

## 2016-03-28 ENCOUNTER — Ambulatory Visit (INDEPENDENT_AMBULATORY_CARE_PROVIDER_SITE_OTHER): Payer: BLUE CROSS/BLUE SHIELD | Admitting: Family Medicine

## 2016-03-28 ENCOUNTER — Encounter: Payer: Self-pay | Admitting: Family Medicine

## 2016-03-28 VITALS — BP 118/84 | HR 74 | Temp 97.1°F | Ht 62.0 in | Wt 167.2 lb

## 2016-03-28 DIAGNOSIS — Z8619 Personal history of other infectious and parasitic diseases: Secondary | ICD-10-CM

## 2016-03-28 DIAGNOSIS — E538 Deficiency of other specified B group vitamins: Secondary | ICD-10-CM | POA: Diagnosis not present

## 2016-03-28 DIAGNOSIS — E559 Vitamin D deficiency, unspecified: Secondary | ICD-10-CM | POA: Diagnosis not present

## 2016-03-28 DIAGNOSIS — I10 Essential (primary) hypertension: Secondary | ICD-10-CM | POA: Diagnosis not present

## 2016-03-28 DIAGNOSIS — R7303 Prediabetes: Secondary | ICD-10-CM | POA: Diagnosis not present

## 2016-03-28 MED FILL — AMLODIPINE BESYLATE 5 MG TA: 5 | 30 days supply | Qty: 30 | Fill #1

## 2016-03-28 MED FILL — LOSARTAN POTASSIUM 100 MG T: 100 | 30 days supply | Qty: 30 | Fill #1
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
## 2016-03-28 NOTE — Patient Instructions (Signed)
It was very nice to see you today Please do send me a copy of your recent labs from the work health screening so I can look at your cholesterol Today we will check your vitamin D, vitamin B12 and hemoglobin A1c (screneing for diabetes/ pre-diabetes)  I'll be in touch with your labs asap Your BP looks fine- if your average home readings are higher than 140/90 please do let me know Let's plan to meet for a physical in 3-6 months at your convenience

## 2016-03-29 ENCOUNTER — Encounter: Payer: Self-pay | Admitting: Family Medicine

## 2016-03-29 DIAGNOSIS — R7303 Prediabetes: Secondary | ICD-10-CM | POA: Insufficient documentation

## 2016-03-29 LAB — VITAMIN D 25 HYDROXY (VIT D DEFICIENCY, FRACTURES): VITD: 30.03 ng/mL (ref 30.00–100.00)

## 2016-03-29 LAB — VITAMIN B12: Vitamin B-12: 253 pg/mL (ref 211–911)

## 2016-03-29 LAB — HEMOGLOBIN A1C: HEMOGLOBIN A1C: 5.7 % (ref 4.6–6.5)

## 2016-03-30 ENCOUNTER — Telehealth: Payer: Self-pay | Admitting: Family Medicine

## 2016-03-30 NOTE — Telephone Encounter (Signed)
Pt dropped off document for PCP to see and have on chart (document in white small envelope). Document given to [facility].

## 2016-03-31 ENCOUNTER — Encounter: Payer: Self-pay | Admitting: Family Medicine

## 2016-03-31 ENCOUNTER — Other Ambulatory Visit: Payer: Self-pay | Admitting: Family Medicine

## 2016-04-01 ENCOUNTER — Other Ambulatory Visit: Payer: Self-pay | Admitting: Family Medicine

## 2016-04-05 ENCOUNTER — Telehealth: Payer: Self-pay | Admitting: Family Medicine

## 2016-04-05 NOTE — Telephone Encounter (Signed)
Relation to [id number] Call back number:[phone]   Reason for call:  Patient inquiring about lab results

## 2016-04-07 NOTE — Telephone Encounter (Signed)
Returning pt's call regarding lab results. Unable to reach pt, left voicemail informing pt that lab results were mailed and that I could mail another copy if needed. Also informed pt to call back with any additional questions she may have.

## 2016-04-21 ENCOUNTER — Other Ambulatory Visit (HOSPITAL_BASED_OUTPATIENT_CLINIC_OR_DEPARTMENT_OTHER): Payer: Self-pay | Admitting: Obstetrics and Gynecology

## 2016-04-21 DIAGNOSIS — Z1239 Encounter for other screening for malignant neoplasm of breast: Secondary | ICD-10-CM

## 2016-05-06 ENCOUNTER — Emergency Department (HOSPITAL_BASED_OUTPATIENT_CLINIC_OR_DEPARTMENT_OTHER)
Admission: EM | Admit: 2016-05-06 | Discharge: 2016-05-06 | Disposition: A | Discharge status: Home / Self Care | Payer: BLUE CROSS/BLUE SHIELD | Attending: Emergency Medicine | Admitting: Emergency Medicine

## 2016-05-06 ENCOUNTER — Encounter (HOSPITAL_BASED_OUTPATIENT_CLINIC_OR_DEPARTMENT_OTHER): Payer: Self-pay | Admitting: Emergency Medicine

## 2016-05-06 ENCOUNTER — Emergency Department (HOSPITAL_BASED_OUTPATIENT_CLINIC_OR_DEPARTMENT_OTHER): Payer: BLUE CROSS/BLUE SHIELD

## 2016-05-06 ENCOUNTER — Other Ambulatory Visit: Payer: Self-pay

## 2016-05-06 DIAGNOSIS — M549 Dorsalgia, unspecified: Secondary | ICD-10-CM | POA: Diagnosis not present

## 2016-05-06 DIAGNOSIS — Y999 Unspecified external cause status: Secondary | ICD-10-CM | POA: Diagnosis not present

## 2016-05-06 DIAGNOSIS — R0789 Other chest pain: Secondary | ICD-10-CM | POA: Diagnosis not present

## 2016-05-06 DIAGNOSIS — R1011 Right upper quadrant pain: Secondary | ICD-10-CM | POA: Diagnosis present

## 2016-05-06 DIAGNOSIS — Y92410 Unspecified street and highway as the place of occurrence of the external cause: Secondary | ICD-10-CM | POA: Diagnosis not present

## 2016-05-06 DIAGNOSIS — Y9389 Activity, other specified: Secondary | ICD-10-CM | POA: Insufficient documentation

## 2016-05-06 DIAGNOSIS — Z79899 Other long term (current) drug therapy: Secondary | ICD-10-CM | POA: Insufficient documentation

## 2016-05-06 DIAGNOSIS — I10 Essential (primary) hypertension: Secondary | ICD-10-CM | POA: Insufficient documentation

## 2016-05-06 LAB — COMPREHENSIVE METABOLIC PANEL
ALT: 20 U/L (ref 14–54)
AST: 28 U/L (ref 15–41)
Albumin: 4.6 g/dL (ref 3.5–5.0)
Alkaline Phosphatase: 54 U/L (ref 38–126)
Anion gap: 7 (ref 5–15)
BILIRUBIN TOTAL: 0.5 mg/dL (ref 0.3–1.2)
BUN: 7 mg/dL (ref 6–20)
CO2: 27 mmol/L (ref 22–32)
CREATININE: 0.91 mg/dL (ref 0.44–1.00)
Calcium: 9.7 mg/dL (ref 8.9–10.3)
Chloride: 103 mmol/L (ref 101–111)
GFR calc Af Amer: >60 mL/min (ref >60)
Glucose, Bld: 109 mg/dL — ABNORMAL HIGH (ref 65–99)
Potassium: 3.3 mmol/L — ABNORMAL LOW (ref 3.5–5.1)
Sodium: 137 mmol/L (ref 135–145)
TOTAL PROTEIN: 8 g/dL (ref 6.5–8.1)

## 2016-05-06 LAB — CBC WITH DIFFERENTIAL/PLATELET
BASOS ABS: 0.1 10*3/uL (ref 0.0–0.1)
Basophils Relative: 1 %
Eosinophils Absolute: 0.1 10*3/uL (ref 0.0–0.7)
Eosinophils Relative: 1 %
HEMATOCRIT: 41.3 % (ref 36.0–46.0)
Hemoglobin: 14.2 g/dL (ref 12.0–15.0)
LYMPHS ABS: 1.3 10*3/uL (ref 0.7–4.0)
LYMPHS PCT: 15 %
MCH: 29.7 pg (ref 26.0–34.0)
MCHC: 34.4 g/dL (ref 30.0–36.0)
MCV: 86.4 fL (ref 78.0–100.0)
MONO ABS: 0.6 10*3/uL (ref 0.1–1.0)
Monocytes Relative: 7 %
NEUTROS ABS: 6.7 10*3/uL (ref 1.7–7.7)
Neutrophils Relative %: 76 %
Platelets: 221 10*3/uL (ref 150–400)
RBC: 4.78 MIL/uL (ref 3.87–5.11)
RDW: 13.9 % (ref 11.5–15.5)
WBC: 8.6 10*3/uL (ref 4.0–10.5)

## 2016-05-06 LAB — TROPONIN I: Troponin I: 0.03 ng/mL (ref <0.03)

## 2016-05-06 MED ORDER — KETOROLAC TROMETHAMINE 30 MG/ML IJ SOLN
30.0000 mg | Freq: Once | INTRAMUSCULAR | Status: AC
Start: 2016-05-06 — End: 2016-05-06
  Administered 2016-05-06: 30 mg via INTRAVENOUS
  Filled 2016-05-06: qty 1

## 2016-05-06 MED ORDER — IOPAMIDOL (ISOVUE-300) INJECTION 61%
100.0000 mL | Freq: Once | INTRAVENOUS | Status: AC | PRN
  Administered 2016-05-06: 100 mL via INTRAVENOUS

## 2016-05-06 MED ORDER — IBUPROFEN 600 MG PO TABS: 600.0000 mg | ORAL_TABLET | Freq: Four times a day (QID) | ORAL | 0 refills | Status: DC | PRN

## 2016-05-06 MED ORDER — METHOCARBAMOL 500 MG PO TABS: 1000.0000 mg | ORAL_TABLET | Freq: Three times a day (TID) | ORAL | 0 refills | Status: DC | PRN
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
## 2016-05-06 NOTE — ED Notes (Signed)
Pt verbalizes understanding of d/c instructions and denies any further needs at this time. 
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
## 2016-05-07 ENCOUNTER — Emergency Department (HOSPITAL_BASED_OUTPATIENT_CLINIC_OR_DEPARTMENT_OTHER)
Admission: EM | Admit: 2016-05-07 | Discharge: 2016-05-07 | Disposition: A | Discharge status: Home / Self Care | Payer: BLUE CROSS/BLUE SHIELD | Attending: Emergency Medicine | Admitting: Emergency Medicine

## 2016-05-07 ENCOUNTER — Emergency Department (HOSPITAL_BASED_OUTPATIENT_CLINIC_OR_DEPARTMENT_OTHER): Payer: BLUE CROSS/BLUE SHIELD

## 2016-05-07 ENCOUNTER — Encounter (HOSPITAL_BASED_OUTPATIENT_CLINIC_OR_DEPARTMENT_OTHER): Payer: Self-pay | Admitting: Emergency Medicine

## 2016-05-07 DIAGNOSIS — Y999 Unspecified external cause status: Secondary | ICD-10-CM | POA: Diagnosis not present

## 2016-05-07 DIAGNOSIS — R0789 Other chest pain: Secondary | ICD-10-CM | POA: Diagnosis not present

## 2016-05-07 DIAGNOSIS — S8992XA Unspecified injury of left lower leg, initial encounter: Secondary | ICD-10-CM | POA: Diagnosis present

## 2016-05-07 DIAGNOSIS — Z79899 Other long term (current) drug therapy: Secondary | ICD-10-CM | POA: Diagnosis not present

## 2016-05-07 DIAGNOSIS — R05 Cough: Secondary | ICD-10-CM | POA: Insufficient documentation

## 2016-05-07 DIAGNOSIS — I10 Essential (primary) hypertension: Secondary | ICD-10-CM | POA: Diagnosis not present

## 2016-05-07 DIAGNOSIS — Y92410 Unspecified street and highway as the place of occurrence of the external cause: Secondary | ICD-10-CM | POA: Diagnosis not present

## 2016-05-07 DIAGNOSIS — S8002XA Contusion of left knee, initial encounter: Secondary | ICD-10-CM | POA: Diagnosis not present

## 2016-05-07 DIAGNOSIS — Y9389 Activity, other specified: Secondary | ICD-10-CM | POA: Diagnosis not present

## 2016-05-07 DIAGNOSIS — S298XXA Other specified injuries of thorax, initial encounter: Secondary | ICD-10-CM
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
## 2016-05-09 ENCOUNTER — Ambulatory Visit (HOSPITAL_BASED_OUTPATIENT_CLINIC_OR_DEPARTMENT_OTHER)
Admission: RE | Admit: 2016-05-09 | Discharge: 2016-05-09 | Disposition: A | Discharge status: Home / Self Care | Payer: BLUE CROSS/BLUE SHIELD | Source: Ambulatory Visit | Attending: Medical | Admitting: Medical

## 2016-05-09 ENCOUNTER — Ambulatory Visit (INDEPENDENT_AMBULATORY_CARE_PROVIDER_SITE_OTHER): Payer: BLUE CROSS/BLUE SHIELD | Admitting: Medical

## 2016-05-09 ENCOUNTER — Encounter: Payer: Self-pay | Admitting: Medical

## 2016-05-09 VITALS — BP 118/80 | HR 75 | Temp 98.1°F | Ht 62.0 in | Wt 166.4 lb

## 2016-05-09 DIAGNOSIS — M79661 Pain in right lower leg: Secondary | ICD-10-CM

## 2016-05-09 DIAGNOSIS — R0981 Nasal congestion: Secondary | ICD-10-CM

## 2016-05-09 DIAGNOSIS — E876 Hypokalemia: Secondary | ICD-10-CM

## 2016-05-09 DIAGNOSIS — M19012 Primary osteoarthritis, left shoulder: Secondary | ICD-10-CM | POA: Insufficient documentation

## 2016-05-09 DIAGNOSIS — J0120 Acute ethmoidal sinusitis, unspecified: Secondary | ICD-10-CM

## 2016-05-09 DIAGNOSIS — M25512 Pain in left shoulder: Secondary | ICD-10-CM | POA: Diagnosis present

## 2016-05-09 DIAGNOSIS — M898X6 Other specified disorders of bone, lower leg: Secondary | ICD-10-CM

## 2016-05-09 DIAGNOSIS — T148XXA Other injury of unspecified body region, initial encounter: Secondary | ICD-10-CM | POA: Diagnosis not present

## 2016-05-09 DIAGNOSIS — J209 Acute bronchitis, unspecified: Secondary | ICD-10-CM

## 2016-05-09 DIAGNOSIS — M898X1 Other specified disorders of bone, shoulder: Secondary | ICD-10-CM

## 2016-05-09 DIAGNOSIS — R9431 Abnormal electrocardiogram [ECG] [EKG]: Secondary | ICD-10-CM

## 2016-05-09 DIAGNOSIS — S301XXD Contusion of abdominal wall, subsequent encounter: Secondary | ICD-10-CM | POA: Diagnosis not present

## 2016-05-09 DIAGNOSIS — R748 Abnormal levels of other serum enzymes: Secondary | ICD-10-CM | POA: Diagnosis not present

## 2016-05-09 DIAGNOSIS — J0100 Acute maxillary sinusitis, unspecified: Secondary | ICD-10-CM

## 2016-05-09 DIAGNOSIS — R7989 Other specified abnormal findings of blood chemistry: Secondary | ICD-10-CM

## 2016-05-09 DIAGNOSIS — R778 Other specified abnormalities of plasma proteins: Secondary | ICD-10-CM

## 2016-05-09 DIAGNOSIS — M542 Cervicalgia: Secondary | ICD-10-CM | POA: Diagnosis not present

## 2016-05-09 LAB — COMPREHENSIVE METABOLIC PANEL
ALK PHOS: 51 U/L (ref 39–117)
ALT: 18 U/L (ref 0–35)
AST: 23 U/L (ref 0–37)
Albumin: 4.5 g/dL (ref 3.5–5.2)
BILIRUBIN TOTAL: 0.6 mg/dL (ref 0.2–1.2)
BUN: 9 mg/dL (ref 6–23)
CO2: 29 meq/L (ref 19–32)
CREATININE: 0.91 mg/dL (ref 0.40–1.20)
Calcium: 9.6 mg/dL (ref 8.4–10.5)
Chloride: 105 mEq/L (ref 96–112)
GFR: 80.15 mL/min (ref >60.00)
GLUCOSE: 96 mg/dL (ref 70–99)
Potassium: 3.5 mEq/L (ref 3.5–5.1)
Sodium: 141 mEq/L (ref 135–145)
TOTAL PROTEIN: 7.7 g/dL (ref 6.0–8.3)

## 2016-05-09 LAB — TROPONIN I: TNIDX: 0.02 ug/l (ref 0.00–0.06)

## 2016-05-09 MED ORDER — KETOROLAC TROMETHAMINE 60 MG/2ML IM SOLN
60.0000 mg | Freq: Once | INTRAMUSCULAR | Status: AC
  Administered 2016-05-09: 60 mg via INTRAMUSCULAR

## 2016-05-09 MED ORDER — DOXYCYCLINE HYCLATE 100 MG PO TABS: 100.0000 mg | ORAL_TABLET | Freq: Two times a day (BID) | ORAL | 0 refills | Status: DC

## 2016-05-09 MED ORDER — FLUTICASONE PROPIONATE 50 MCG/ACT NA SUSP: 2.0000 | Freq: Every day | NASAL | 3 refills | Status: DC

## 2016-05-09 MED FILL — DOXYCYCLINE 100 MG TABLET: 100 | 10 days supply | Qty: 20 | Fill #0

## 2016-05-09 MED FILL — LOSARTAN POTASSIUM 100 MG T: 100 | 30 days supply | Qty: 30 | Fill #2

## 2016-05-09 MED FILL — IBUPROFEN 600 MG TABLET: 600 | 8 days supply | Qty: 30 | Fill #0

## 2016-05-09 MED FILL — METHOCARBAMOL 500 MG TABLET: 500 | 5 days supply | Qty: 30 | Fill #0

## 2016-05-09 MED FILL — AMLODIPINE BESYLATE 5 MG TA: 5 | 30 days supply | Qty: 30 | Fill #2

## 2016-05-09 MED FILL — FLUTICASONE PROP 50 MCG SPR: 50 | 30 days supply | Qty: 16 | Fill #0
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
## 2016-05-09 NOTE — Addendum Note (Signed)
Addended by: Tasia Catchings on: 05/09/2016 02:58 PM   Modules accepted: Orders
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
## 2016-05-09 NOTE — Progress Notes (Signed)
Pre visit review using our clinic review tool, if applicable. No additional management support is needed unless otherwise documented below in the visit note. 

## 2016-05-10 ENCOUNTER — Ambulatory Visit (HOSPITAL_BASED_OUTPATIENT_CLINIC_OR_DEPARTMENT_OTHER): Payer: BLUE CROSS/BLUE SHIELD

## 2016-05-12 ENCOUNTER — Encounter: Payer: Self-pay | Admitting: Cardiology

## 2016-05-12 ENCOUNTER — Ambulatory Visit (INDEPENDENT_AMBULATORY_CARE_PROVIDER_SITE_OTHER): Payer: BLUE CROSS/BLUE SHIELD | Admitting: Cardiology

## 2016-05-12 ENCOUNTER — Ambulatory Visit (INDEPENDENT_AMBULATORY_CARE_PROVIDER_SITE_OTHER): Payer: BLUE CROSS/BLUE SHIELD | Admitting: Medical

## 2016-05-12 ENCOUNTER — Telehealth: Payer: Self-pay | Admitting: Medical

## 2016-05-12 VITALS — BP 158/100 | HR 84 | Ht 61.5 in | Wt 168.6 lb

## 2016-05-12 DIAGNOSIS — R072 Precordial pain: Secondary | ICD-10-CM

## 2016-05-12 DIAGNOSIS — R1031 Right lower quadrant pain: Secondary | ICD-10-CM

## 2016-05-12 MED ORDER — PREDNISONE 10 MG PO TABS: ORAL_TABLET | ORAL | 0 refills | Status: DC

## 2016-05-12 NOTE — Telephone Encounter (Signed)
Will you arrange for abd [place] to get done on May 13, 2016.

## 2016-05-12 NOTE — Patient Instructions (Addendum)
For recent possible sinus infection and bronchitis continue the doxycycline and nasal spray.   For your muscle pain/areas of pain, I am offering taper prednisone since other meds have not helped. Will send med  to your pharmacy for you to think about. Benefit vs risk discussed.  Will order abdomen US to evaluated the area of prior scar and new tenderness. Also ask them to evaluate rt upper quadrant tender area.  When you get US done I want to also get cmp and cbc.   Follow up in 7 days or as needed
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
## 2016-05-12 NOTE — Progress Notes (Signed)
Pre visit review using our clinic review tool, if applicable. No additional management support is needed unless otherwise documented below in the visit note. 

## 2016-05-12 NOTE — Patient Instructions (Signed)
Medication Instructions:    Your physician recommends that you continue on your current medications as directed. Please refer to the Current Medication list given to you today.  --- If you need a refill on your cardiac medications before your next appointment, please call your pharmacy. ---  Labwork:  None ordered  Testing/Procedures:  None ordered  Follow-Up:  No follow up is needed at this time with Dr. Curt Bears.  He will see you on an as needed basis.  Any Other Special Instructions Will Be Listed Below (If Applicable).  Chronic EKG changes associated with no chest pain.  Continue to follow/monitor.  Return to clinic if you experience chest pain.  Thank you for choosing [facility]!!   Trinidad Curet, RN [phone]

## 2016-05-13 ENCOUNTER — Other Ambulatory Visit (INDEPENDENT_AMBULATORY_CARE_PROVIDER_SITE_OTHER): Payer: BLUE CROSS/BLUE SHIELD

## 2016-05-13 ENCOUNTER — Ambulatory Visit (HOSPITAL_BASED_OUTPATIENT_CLINIC_OR_DEPARTMENT_OTHER)
Admission: RE | Admit: 2016-05-13 | Discharge: 2016-05-13 | Disposition: A | Discharge status: Home / Self Care | Payer: BLUE CROSS/BLUE SHIELD | Source: Ambulatory Visit | Attending: Medical | Admitting: Medical

## 2016-05-13 DIAGNOSIS — R1031 Right lower quadrant pain: Secondary | ICD-10-CM

## 2016-05-13 DIAGNOSIS — G8929 Other chronic pain: Secondary | ICD-10-CM

## 2016-05-13 DIAGNOSIS — K760 Fatty (change of) liver, not elsewhere classified: Secondary | ICD-10-CM | POA: Insufficient documentation

## 2016-05-13 DIAGNOSIS — Z9049 Acquired absence of other specified parts of digestive tract: Secondary | ICD-10-CM | POA: Insufficient documentation

## 2016-05-13 LAB — CBC WITH DIFFERENTIAL/PLATELET
BASOS PCT: 1 %
Basophils Absolute: 81 cells/uL (ref 0–200)
EOS ABS: 324 {cells}/uL (ref 15–500)
Eosinophils Relative: 4 %
HEMATOCRIT: 40.5 % (ref 35.0–45.0)
HEMOGLOBIN: 13.6 g/dL (ref 11.7–15.5)
LYMPHS ABS: 4293 {cells}/uL — AB (ref 850–3900)
LYMPHS PCT: 53 %
MCH: 29.4 pg (ref 27.0–33.0)
MCHC: 33.6 g/dL (ref 32.0–36.0)
MCV: 87.5 fL (ref 80.0–100.0)
MONO ABS: 324 {cells}/uL (ref 200–950)
MPV: 11.7 fL (ref 7.5–12.5)
Monocytes Relative: 4 %
NEUTROS PCT: 38 %
Neutro Abs: 3078 cells/uL (ref 1500–7800)
PLATELETS: 306 10*3/uL (ref 140–400)
RBC: 4.63 MIL/uL (ref 3.80–5.10)
RDW: 14.1 % (ref 11.0–15.0)
WBC: 8.1 10*3/uL (ref 3.8–10.8)

## 2016-05-13 LAB — COMPLETE METABOLIC PANEL WITH GFR
ALT: 15 U/L (ref 6–29)
AST: 21 U/L (ref 10–35)
Albumin: 4.4 g/dL (ref 3.6–5.1)
Alkaline Phosphatase: 53 U/L (ref 33–130)
BUN: 9 mg/dL (ref 7–25)
CHLORIDE: 103 mmol/L (ref 98–110)
CO2: 27 mmol/L (ref 20–31)
CREATININE: 0.94 mg/dL (ref 0.50–0.99)
Calcium: 9.7 mg/dL (ref 8.6–10.4)
GFR, Est African American: 75 mL/min (ref >=60)
GFR, Est Non African American: 65 mL/min (ref >=60)
Glucose, Bld: 84 mg/dL (ref 65–99)
POTASSIUM: 4.1 mmol/L (ref 3.5–5.3)
SODIUM: 138 mmol/L (ref 135–146)
Total Bilirubin: 0.6 mg/dL (ref 0.2–1.2)
Total Protein: 7.6 g/dL (ref 6.1–8.1)

## 2016-05-13 NOTE — Telephone Encounter (Signed)
Order sent to [facility], they will contact pt directly to schedule

## 2016-05-13 NOTE — Telephone Encounter (Signed)
Patient scheduled for 05/13/16 @4 :30

## 2016-05-16 ENCOUNTER — Ambulatory Visit: Payer: BLUE CROSS/BLUE SHIELD | Admitting: Family Medicine

## 2016-05-16 DIAGNOSIS — R0789 Other chest pain: Secondary | ICD-10-CM

## 2016-05-16 DIAGNOSIS — M898X6 Other specified disorders of bone, lower leg: Secondary | ICD-10-CM | POA: Diagnosis not present

## 2016-05-16 DIAGNOSIS — R0781 Pleurodynia: Secondary | ICD-10-CM

## 2016-05-16 DIAGNOSIS — T148XXA Other injury of unspecified body region, initial encounter: Secondary | ICD-10-CM

## 2016-05-16 MED ORDER — CYCLOBENZAPRINE HCL 10 MG PO TABS: 10.0000 mg | ORAL_TABLET | Freq: Two times a day (BID) | ORAL | 0 refills | Status: DC | PRN

## 2016-05-16 MED FILL — CYCLOBENZAPRINE 10 MG TAB: 10 | 15 days supply | Qty: 30 | Fill #0

## 2016-05-16 NOTE — Patient Instructions (Signed)
It was good to see you today- I think that you are on the right track, but please let me know if you do not continue to improve over the next 2-3 weeks - Sooner if worse.  Try the flexeril as needed for pain- this will make you sleepy, do not take it when you need to drive and do not combine with robaxin   Your BP is a bit high today,but this is likely due to recent stressors.  Continue to keep an eye on this and let me know if you do not get back to your usual numbers
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
## 2016-05-16 NOTE — Progress Notes (Signed)
Pre visit review using our clinic review tool, if applicable. No additional management support is needed unless otherwise documented below in the visit note. 

## 2016-05-17 ENCOUNTER — Ambulatory Visit: Payer: BLUE CROSS/BLUE SHIELD | Admitting: Cardiology

## 2016-05-17 ENCOUNTER — Ambulatory Visit (HOSPITAL_BASED_OUTPATIENT_CLINIC_OR_DEPARTMENT_OTHER): Payer: BLUE CROSS/BLUE SHIELD

## 2016-05-23 ENCOUNTER — Telehealth: Payer: Self-pay | Admitting: Emergency Medicine

## 2016-05-23 MED ORDER — ACETAMINOPHEN-CODEINE #3 300-30 MG PO TABS: 1.0000 | ORAL_TABLET | Freq: Four times a day (QID) | ORAL | 0 refills | Status: DC | PRN

## 2016-05-23 NOTE — Telephone Encounter (Signed)
Called her back- her knee (LEFt) has been locking up on her after she sits for a while.  Her chest is improving,but she still has pain with cough or sneeze  Flexeril/ robaxin are not helping her.  She does not tolerate tramadol (HA) but is not allergic to it- no rash, etc.  Will call in tylenol #3 for her and she will let me know how her knee is doing over the next couple of weeks. She may need an MRI if not improving  Cautioned regarding sedation, do not mix with other sedating meds.  She agrees
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
## 2016-05-24 MED FILL — ACETAMINOPHEN/COD #3 TABLET: 300-30 | 4 days supply | Qty: 30 | Fill #0

## 2016-06-06 ENCOUNTER — Encounter: Payer: Self-pay | Admitting: Family Medicine

## 2016-06-06 ENCOUNTER — Ambulatory Visit (INDEPENDENT_AMBULATORY_CARE_PROVIDER_SITE_OTHER): Payer: BLUE CROSS/BLUE SHIELD | Admitting: Family Medicine

## 2016-06-06 VITALS — BP 122/83 | HR 72 | Temp 97.9°F | Ht 61.5 in | Wt 169.4 lb

## 2016-06-06 DIAGNOSIS — S8002XD Contusion of left knee, subsequent encounter: Secondary | ICD-10-CM

## 2016-06-06 DIAGNOSIS — I10 Essential (primary) hypertension: Secondary | ICD-10-CM

## 2016-06-06 NOTE — Progress Notes (Signed)
Pre visit review using our clinic review tool, if applicable. No additional management support is needed unless otherwise documented below in the visit note. 
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
## 2016-06-06 NOTE — Patient Instructions (Signed)
Your BP looks fine today I am going to set up an MRI of your knee and will be in touch with these results asap

## 2016-06-11 ENCOUNTER — Telehealth: Payer: Self-pay | Admitting: Family Medicine

## 2016-06-11 DIAGNOSIS — M25562 Pain in left knee: Secondary | ICD-10-CM

## 2016-06-11 NOTE — Telephone Encounter (Signed)
Called pt- her MRI was denied.  Would suggest that we have her see ortho if she is interested in getting an MRI done.  She is ok with this plan- will refer back to [facility], who she has seen in the past

## 2016-06-16 NOTE — Telephone Encounter (Signed)
Patient called stating that she called [facility] and they stated that they do not have her referral. I let her know that we may be waiting on an authorization code and I would check with referrals to follow up on this situation. She would like a call once the referral is sent over. Please advise.   Patient phone: [phone]

## 2016-06-16 NOTE — Telephone Encounter (Signed)
Referral was put in [facility] workqueue in error. Faxed to [facility] today. LM to notify pt.

## 2016-06-23 ENCOUNTER — Ambulatory Visit (HOSPITAL_BASED_OUTPATIENT_CLINIC_OR_DEPARTMENT_OTHER)
Admission: RE | Admit: 2016-06-23 | Discharge: 2016-06-23 | Disposition: A | Discharge status: Home / Self Care | Payer: BLUE CROSS/BLUE SHIELD | Source: Ambulatory Visit | Attending: Obstetrics and Gynecology | Admitting: Obstetrics and Gynecology

## 2016-06-23 DIAGNOSIS — Z1231 Encounter for screening mammogram for malignant neoplasm of breast: Secondary | ICD-10-CM | POA: Diagnosis not present

## 2016-06-23 DIAGNOSIS — Z1239 Encounter for other screening for malignant neoplasm of breast: Secondary | ICD-10-CM

## 2016-06-23 MED FILL — AMLODIPINE BESYLATE 5 MG TA: 5 | 30 days supply | Qty: 30 | Fill #3

## 2016-06-23 MED FILL — LOSARTAN POTASSIUM 100 MG T: 100 | 30 days supply | Qty: 30 | Fill #3

## 2016-06-27 ENCOUNTER — Other Ambulatory Visit: Payer: Self-pay | Admitting: Obstetrics and Gynecology

## 2016-06-27 DIAGNOSIS — R928 Other abnormal and inconclusive findings on diagnostic imaging of breast: Secondary | ICD-10-CM

## 2016-06-28 ENCOUNTER — Ambulatory Visit: Payer: BLUE CROSS/BLUE SHIELD | Attending: Physician Assistant | Admitting: Physical Therapy

## 2016-06-28 DIAGNOSIS — M25561 Pain in right knee: Secondary | ICD-10-CM | POA: Diagnosis present

## 2016-06-28 DIAGNOSIS — M25562 Pain in left knee: Secondary | ICD-10-CM | POA: Diagnosis not present

## 2016-06-28 DIAGNOSIS — R600 Localized edema: Secondary | ICD-10-CM | POA: Diagnosis present

## 2016-06-28 DIAGNOSIS — M6281 Muscle weakness (generalized): Secondary | ICD-10-CM
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
## 2016-06-30 ENCOUNTER — Ambulatory Visit: Payer: BLUE CROSS/BLUE SHIELD | Admitting: Physical Therapy

## 2016-06-30 DIAGNOSIS — M6281 Muscle weakness (generalized): Secondary | ICD-10-CM

## 2016-06-30 DIAGNOSIS — M25561 Pain in right knee: Secondary | ICD-10-CM

## 2016-06-30 DIAGNOSIS — M25562 Pain in left knee: Secondary | ICD-10-CM

## 2016-06-30 DIAGNOSIS — R600 Localized edema: Secondary | ICD-10-CM

## 2016-06-30 NOTE — Therapy (Signed)
[facility] [address]  [place], [place], [address] Phone: [phone]   Fax:  [phone]  Physical Therapy Treatment  Patient Details  Name: Anna [name] MRN: [id number] Date of Birth: 1951-06-04 Referring Provider: Judith Part. [place], [place]  Encounter Date: 06/30/2016      PT End of Session - 06/30/16 1700    Visit Number 2   Number of Visits 12   Date for PT Re-Evaluation 08/12/16   Authorization Type BCBS   Authorization - Number of Visits 60   PT Start Time 1700   PT Stop Time 1750   PT Time Calculation (min) 50 min   Activity Tolerance Patient tolerated treatment well   Behavior During Therapy WFL for tasks assessed/performed      Past Medical History:  Diagnosis Date  . Arthritis    hands  . Atypical chest pain    Negative nuclear stress test  . Cholelithiasis    Single stone  . Cholelithiasis   . Dry eyes   . Dysrhythmia   . Esophageal stricture   . Family history of adverse reaction to anesthesia    pts sister had severe PONV  . Floaters    bilat   . GERD (gastroesophageal reflux disease)   . Hematuria, microscopic   . Hemorrhoids, internal    and external  . Hepatitis C 06/2015  . Hiatal hernia    EGD 07-2008  . Hypertension   . Numbness and tingling in hands    bilat   . Rhinitis    seasonal  . Tinnitus   . Vitamin D deficiency     Past Surgical History:  Procedure Laterality Date  . ABDOMINAL HYSTERECTOMY     fibroids &dysfunctional menes; USO  . APPENDECTOMY    . CHOLECYSTECTOMY N/A 02/04/2016   Procedure: LAPAROSCOPIC CHOLECYSTECTOMY ;  Surgeon: Armandina Gemma, MD;  Location: [facility];  Service: General;  Laterality: N/A;  . COLONOSCOPY  2007   Dr. Henrene Pastor, due 2017  . CT Angiogram  01/2006   Negative for PE  . G2 P1    . REFRACTIVE SURGERY    . UPPER GI ENDOSCOPY  07/2010   Esophagitis; distal esophageal ringlike structure    There were no vitals filed for this  visit.      Subjective Assessment - 06/30/16 1703    Subjective Pt states knee pain is "about the same", but noting some hip and back pain at times as well.   Diagnostic tests L Knee x-ray 05/07/16: No evidence of fracture, dislocation, or joint effusion. No evidence of arthropathy or other focal bone abnormality. Soft tissues are unremarkable.  F/u x-ray of B knees at MD office on 06/21/16 revealed OA/DJD and bone contusion per pt report.   Patient Stated Goals "hope to be able to bend knees w/o an get back to my exercise routine"   Currently in Pain? No/denies   Pain Score 0-No pain                  [facility] Adult PT Treatment/Exercise - 06/30/16 1700      Exercises   Exercises Knee/Hip     Knee/Hip Exercises: Stretches   Hip Flexor Stretch Left;30 seconds;2 reps  + RF   Hip Flexor Stretch Limitations mod thomas with strap   ITB Stretch Both;30 seconds;2 reps   ITB Stretch Limitations supine with strap     Knee/Hip Exercises: Aerobic   Nustep lvl 4 x 6'  Knee/Hip Exercises: Supine   Hip Adduction Isometric Both;10 reps   Hip Adduction Isometric Limitations 5" hold ball squeeze   Bridges Both;10 reps   Bridges Limitations 5" hold   Straight Leg Raise with External Rotation Both;10 reps   Straight Leg Raise with External Rotation Limitations 5" hold     Knee/Hip Exercises: Sidelying   Hip ADduction Both;10 reps   Hip ADduction Limitations 3" hold   Clams Green TB 10x3" bilateral      Manual Therapy   Manual Therapy Taping   Kinesiotex Create Space     Kinesiotix   Create Space Chondromalacia patella pattern to B knees - 2 strips 30% medial and lateral to patella crossing at tibial tuberosity & distal quad + horiz strip 50% across patellar tendon                PT Education - 06/30/16 1750    Education provided Yes   Education Details Initial HEP & role of/wearing instructions for kinesiotape   Person(s) Educated Patient   Methods  Explanation;Demonstration;Handout   Comprehension Verbalized understanding;Returned demonstration;Need further instruction          PT Short Term Goals - 06/30/16 1705      PT SHORT TERM GOAL #1   Title Independent with initial HEP by 07/15/16   Status On-going           PT Long Term Goals - 06/30/16 1705      PT LONG TERM GOAL #1   Title Independent with advanced HEP/workout routine as indicated by 08/12/16   Status On-going     PT LONG TERM GOAL #2   Title B hip and knee strength >/= 4+/5 by 08/12/16   Status On-going     PT LONG TERM GOAL #3   Title Pt will tolerate sitting for >1 hr w/o limitation due to B knee pain by 08/12/16   Status On-going     PT LONG TERM GOAL #4   Title Pt will be able to resume working out w/o limitation due to B knee pain by 08/12/16   Status On-going               Plan - 06/30/16 1705    Clinical Impression Statement Instructions provided in initial HEP exercises with pt able to perform return demonstration of all exercises w/o pain. Educated pt on role of kinesiotaping and modified chondromalcia pattern applied to B knees.   Rehab Potential Good   PT Treatment/Interventions Patient/family education;Therapeutic exercise;Neuromuscular re-education;Manual techniques;Taping;Therapeutic activities;Electrical Stimulation;Moist Heat;Cryotherapy;Vasopneumatic Device;Iontophoresis 4mg /ml Dexamethasone;ADLs/Self Care Home Management   PT Next Visit Plan Assess reponse to taping; Review and progress HEP as indicated; ITB & quad/hip flexor flexibility, core/LE strengthening; Continued kinesiotaping for chondromalcia if benefit noted   Consulted and Agree with Plan of Care Patient      Patient will benefit from skilled therapeutic intervention in order to improve the following deficits and impairments:  Pain, Impaired flexibility, Increased muscle spasms, Decreased strength, Decreased activity tolerance  Visit Diagnosis: Acute pain of left  knee  Acute pain of right knee  Muscle weakness (generalized)  Localized edema     Problem List Patient Active Problem List   Diagnosis Date Noted  . Pre-diabetes 03/29/2016  . Cholelithiasis with chronic cholecystitis 02/04/2016  . Chronic cholecystitis with calculus 02/02/2016  . Esophagitis 09/03/2015  . Liver fibrosis ([place]) 09/03/2015  . History of hepatitis C- cured 2017 07/22/2015  . B12 deficiency 11/07/2014  . Plantar fasciitis of right foot  08/13/2014  . Deformity of metatarsal bone of right foot 08/13/2014  . Dyspnea 02/08/2013  . HSV antigen DIF positive 02/09/2011  . HIATAL HERNIA 07/13/2010  . NONSPEC ELEVATION OF LEVELS OF TRANSAMINASE/LDH 04/16/2010  . ESOPHAGEAL STRICTURE 02/05/2010  . RHINITIS 09/25/2008  . CHOLELITHIASIS 08/05/2008  . Essential hypertension 04/05/2007  . Nonspecific abnormal electrocardiogram (ECG) (EKG) 03/13/2007    Percival Spanish, PT, MPT 06/30/2016, 6:07 PM  [facility] [address]  [address] [place], [place], [address] Phone: [phone]   Fax:  [phone]  Name: Jakailyn Dils MRN: [id number] Date of Birth: Mar 25, 1951

## 2016-07-04 ENCOUNTER — Ambulatory Visit: Payer: BLUE CROSS/BLUE SHIELD

## 2016-07-04 DIAGNOSIS — M25561 Pain in right knee: Secondary | ICD-10-CM

## 2016-07-04 DIAGNOSIS — M25562 Pain in left knee: Secondary | ICD-10-CM

## 2016-07-04 DIAGNOSIS — M6281 Muscle weakness (generalized): Secondary | ICD-10-CM

## 2016-07-04 DIAGNOSIS — R600 Localized edema: Secondary | ICD-10-CM

## 2016-07-04 NOTE — Therapy (Signed)
[facility] [address]  [place], [place], [address] Phone: [phone]   Fax:  [phone]  Physical Therapy Treatment  Patient Details  Name: Anna [name] MRN: [id number] Date of Birth: [birth year] Referring Provider: Judith Part. [place], [place]  Encounter Date: 07/04/2016      PT End of Session - 07/04/16 1421    Visit Number 3   Number of Visits 12   Date for PT Re-Evaluation 08/12/16   Authorization Type BCBS   Authorization - Number of Visits 60   PT Start Time 1410  session started late due to last treatment running late    PT Stop Time 1456  Session ending late due to no show from 2:45 pt.    PT Time Calculation (min) 46 min   Activity Tolerance Patient tolerated treatment well   Behavior During Therapy WFL for tasks assessed/performed      Past Medical History:  Diagnosis Date  . Arthritis    hands  . Atypical chest pain    Negative nuclear stress test  . Cholelithiasis    Single stone  . Cholelithiasis   . Dry eyes   . Dysrhythmia   . Esophageal stricture   . Family history of adverse reaction to anesthesia    pts sister had severe PONV  . Floaters    bilat   . GERD (gastroesophageal reflux disease)   . Hematuria, microscopic   . Hemorrhoids, internal    and external  . Hepatitis C 06/2015  . Hiatal hernia    EGD 07-2008  . Hypertension   . Numbness and tingling in hands    bilat   . Rhinitis    seasonal  . Tinnitus   . Vitamin D deficiency     Past Surgical History:  Procedure Laterality Date  . ABDOMINAL HYSTERECTOMY     fibroids &dysfunctional menes; USO  . APPENDECTOMY    . CHOLECYSTECTOMY N/A 02/04/2016   Procedure: LAPAROSCOPIC CHOLECYSTECTOMY ;  Surgeon: Armandina Gemma, MD;  Location: [facility];  Service: General;  Laterality: N/A;  . COLONOSCOPY  2007   Dr. Henrene Pastor, due 2017  . CT Angiogram  01/2006   Negative for PE  . G2 P1    . REFRACTIVE SURGERY    . UPPER GI ENDOSCOPY   07/2010   Esophagitis; distal esophageal ringlike structure    There were no vitals filed for this visit.      Subjective Assessment - 07/04/16 1418    Subjective Pt. reporting she is still having frequent hip and back pain.  Pt. reporting she has not attempted to drive a long distance yet.     Patient Stated Goals "hope to be able to bend knees w/o an get back to my exercise routine"   Currently in Pain? No/denies   Pain Score 0-No pain   Multiple Pain Sites No            [facility] Adult PT Treatment/Exercise - 07/04/16 1425      Knee/Hip Exercises: Stretches   Hip Flexor Stretch Left;30 seconds;2 reps   Hip Flexor Stretch Limitations mod thomas with overpressure from therapist    ITB Stretch Both;30 seconds;2 reps   ITB Stretch Limitations supine with strap     Knee/Hip Exercises: Aerobic   Nustep lvl 5 x 6'     Knee/Hip Exercises: Seated   Other Seated Knee/Hip Exercises B fitter leg press (1 blue, 1 black) x 15 reps  Knee/Hip Exercises: Supine   Hip Adduction Isometric Both;10 reps   Hip Adduction Isometric Limitations 5" hold ball squeeze   Bridges with Ball Squeeze 15 reps  5" hold   Straight Leg Raise with External Rotation Both;10 reps   Straight Leg Raise with External Rotation Limitations 5" hold     Knee/Hip Exercises: Sidelying   Hip ADduction Both;10 reps  2#    Hip ADduction Limitations 3" hold   Clams blue TB 10 x 3" bilateral             PT Short Term Goals - 06/30/16 1705      PT SHORT TERM GOAL #1   Title Independent with initial HEP by 07/15/16   Status On-going           PT Long Term Goals - 06/30/16 1705      PT LONG TERM GOAL #1   Title Independent with advanced HEP/workout routine as indicated by 08/12/16   Status On-going     PT LONG TERM GOAL #2   Title B hip and knee strength >/= 4+/5 by 08/12/16   Status On-going     PT LONG TERM GOAL #3   Title Pt will tolerate sitting for >1 hr w/o limitation due to B knee pain by  08/12/16   Status On-going     PT LONG TERM GOAL #4   Title Pt will be able to resume working out w/o limitation due to B knee pain by 08/12/16   Status On-going               Plan - 07/04/16 1450    Clinical Impression Statement Pt. tolerated addition of seated fitter leg press well today and was pain free throughout treatment.  Pt. demonstrating difficulty with progression to 2# SLR  and adduction raise today however able to perform.  Pt. reporting taping to B knees helped over weekend; taping still intact thus not reapplied today.  Pt. reporting HEP is going well however she is not able to get to all activities each day.  Pt. instructed to get to all HEP activities daily for increased benefit from therapy.  Will plan to progress to standing LE strengthening activity as pt. tolerates and continue K-taping to B knees prn.   PT Treatment/Interventions Patient/family education;Therapeutic exercise;Neuromuscular re-education;Manual techniques;Taping;Therapeutic activities;Electrical Stimulation;Moist Heat;Cryotherapy;Vasopneumatic Device;Iontophoresis 4mg /ml Dexamethasone;ADLs/Self Care Home Management   PT Next Visit Plan Review and progress HEP as indicated; ITB & quad/hip flexor flexibility, core/LE strengthening; Continued kinesiotaping for chondromalcia if benefit noted      Patient will benefit from skilled therapeutic intervention in order to improve the following deficits and impairments:  Pain, Impaired flexibility, Increased muscle spasms, Decreased strength, Decreased activity tolerance  Visit Diagnosis: Acute pain of left knee  Acute pain of right knee  Muscle weakness (generalized)  Localized edema     Problem List Patient Active Problem List   Diagnosis Date Noted  . Pre-diabetes 03/29/2016  . Cholelithiasis with chronic cholecystitis 02/04/2016  . Chronic cholecystitis with calculus 02/02/2016  . Esophagitis 09/03/2015  . Liver fibrosis ([place]) 09/03/2015  .  History of hepatitis C- cured 2017 07/22/2015  . B12 deficiency 11/07/2014  . Plantar fasciitis of right foot 08/13/2014  . Deformity of metatarsal bone of right foot 08/13/2014  . Dyspnea 02/08/2013  . HSV antigen DIF positive 02/09/2011  . HIATAL HERNIA 07/13/2010  . NONSPEC ELEVATION OF LEVELS OF TRANSAMINASE/LDH 04/16/2010  . ESOPHAGEAL STRICTURE 02/05/2010  . RHINITIS 09/25/2008  . CHOLELITHIASIS  08/05/2008  . Essential hypertension 04/05/2007  . Nonspecific abnormal electrocardiogram (ECG) (EKG) 03/13/2007    Bess Harvest, PTA 07/04/16 3:30 PM  [facility] [address]  [place], [place], [address] Phone: [phone]   Fax:  [phone]  Name: Anna [name] MRN: [id number] Date of Birth: August 05, 1950

## 2016-07-06 ENCOUNTER — Ambulatory Visit: Payer: BLUE CROSS/BLUE SHIELD

## 2016-07-06 ENCOUNTER — Ambulatory Visit
Admission: RE | Admit: 2016-07-06 | Discharge: 2016-07-06 | Disposition: A | Discharge status: Home / Self Care | Payer: BLUE CROSS/BLUE SHIELD | Source: Ambulatory Visit | Attending: Obstetrics and Gynecology | Admitting: Obstetrics and Gynecology

## 2016-07-06 DIAGNOSIS — M6281 Muscle weakness (generalized): Secondary | ICD-10-CM

## 2016-07-06 DIAGNOSIS — R928 Other abnormal and inconclusive findings on diagnostic imaging of breast: Secondary | ICD-10-CM

## 2016-07-06 DIAGNOSIS — R600 Localized edema: Secondary | ICD-10-CM

## 2016-07-06 DIAGNOSIS — M25562 Pain in left knee: Secondary | ICD-10-CM

## 2016-07-06 DIAGNOSIS — M25561 Pain in right knee: Secondary | ICD-10-CM

## 2016-07-06 NOTE — Therapy (Signed)
[facility] [address]  [place], [place], [address] Phone: [phone]   Fax:  [phone]  Physical Therapy Treatment  Patient Details  Name: Anna [name] MRN: [id number] Date of Birth: 12/29/1950 Referring Provider: Judith Part. [place], [place]  Encounter Date: 07/06/2016      PT End of Session - 07/06/16 1747    Visit Number 4   Number of Visits 12   Date for PT Re-Evaluation 08/12/16   Authorization Type BCBS   Authorization - Number of Visits 30   PT Start Time 1707   PT Stop Time 1750   PT Time Calculation (min) 43 min   Activity Tolerance Patient tolerated treatment well   Behavior During Therapy WFL for tasks assessed/performed      Past Medical History:  Diagnosis Date  . Arthritis    hands  . Atypical chest pain    Negative nuclear stress test  . Cholelithiasis    Single stone  . Cholelithiasis   . Dry eyes   . Dysrhythmia   . Esophageal stricture   . Family history of adverse reaction to anesthesia    pts sister had severe PONV  . Floaters    bilat   . GERD (gastroesophageal reflux disease)   . Hematuria, microscopic   . Hemorrhoids, internal    and external  . Hepatitis C 06/2015  . Hiatal hernia    EGD 07-2008  . Hypertension   . Numbness and tingling in hands    bilat   . Rhinitis    seasonal  . Tinnitus   . Vitamin D deficiency     Past Surgical History:  Procedure Laterality Date  . ABDOMINAL HYSTERECTOMY     fibroids &dysfunctional menes; USO  . APPENDECTOMY    . CHOLECYSTECTOMY N/A 02/04/2016   Procedure: LAPAROSCOPIC CHOLECYSTECTOMY ;  Surgeon: Armandina Gemma, MD;  Location: [facility];  Service: General;  Laterality: N/A;  . COLONOSCOPY  2007   Dr. Henrene Pastor, due 2017  . CT Angiogram  01/2006   Negative for PE  . G2 P1    . REFRACTIVE SURGERY    . UPPER GI ENDOSCOPY  07/2010   Esophagitis; distal esophageal ringlike structure    There were no vitals filed for this  visit.      Subjective Assessment - 07/06/16 1714    Subjective Pt. reporting taping to B knees helped however taping removed yesterday.     Patient Stated Goals "hope to be able to bend knees w/o an get back to my exercise routine"   Currently in Pain? No/denies   Pain Score 0-No pain   Multiple Pain Sites No            [facility] Adult PT Treatment/Exercise - 07/06/16 1741      Knee/Hip Exercises: Stretches   Hip Flexor Stretch Left;30 seconds;2 reps   Hip Flexor Stretch Limitations mod thomas with overpressure from therapist    ITB Stretch Both;30 seconds;2 reps   ITB Stretch Limitations B sidelying with therapist overpressure     Knee/Hip Exercises: Aerobic   Stationary Bike Recumbent bike: level 2, 6 min      Knee/Hip Exercises: Supine   Straight Leg Raises AROM;1 set;Right;Left;15 reps   Straight Leg Raises Limitations 2#     Knee/Hip Exercises: Sidelying   Hip ABduction AROM;1 set;15 reps;Right;Left   Hip ABduction Limitations 2#      Kinesiotix   Create Space Chondromalacia patella pattern to B knees -  2 strips 30% medial and lateral to patella crossing at tibial tuberosity & distal quad + horiz strip 50% across patellar tendon          PT Short Term Goals - 07/06/16 1749      PT SHORT TERM GOAL #1   Title Independent with initial HEP by 07/15/16   Status Achieved           PT Long Term Goals - 06/30/16 1705      PT LONG TERM GOAL #1   Title Independent with advanced HEP/workout routine as indicated by 08/12/16   Status On-going     PT LONG TERM GOAL #2   Title B hip and knee strength >/= 4+/5 by 08/12/16   Status On-going     PT LONG TERM GOAL #3   Title Pt will tolerate sitting for >1 hr w/o limitation due to B knee pain by 08/12/16   Status On-going     PT LONG TERM GOAL #4   Title Pt will be able to resume working out w/o limitation due to B knee pain by 08/12/16   Status On-going               Plan - 07/06/16 1749    Clinical  Impression Statement Pt. reporting good benefit from taping to B knees however taping removed on 12/19 lasting 6 days.  Taping reapplied to B knees today.  Pt. tolerated increased repetitions with SLR 2# and was pain free with supine therex today.  When asked about status of HEP, pt. reporting inconsistent adherence.  Pt. instructed to perform HEP consistently for enhanced benefit from therapy.  Will plan to progress strengthening activity per tolerance in coming visits.   PT Treatment/Interventions Patient/family education;Therapeutic exercise;Neuromuscular re-education;Manual techniques;Taping;Therapeutic activities;Electrical Stimulation;Moist Heat;Cryotherapy;Vasopneumatic Device;Iontophoresis 4mg /ml Dexamethasone;ADLs/Self Care Home Management   PT Next Visit Plan Asses respone to trial 2# of taping to B knees; Review and progress HEP as indicated; ITB & quad/hip flexor flexibility, core/LE strengthening; Continued kinesiotaping for chondromalcia if benefit noted      Patient will benefit from skilled therapeutic intervention in order to improve the following deficits and impairments:  Pain, Impaired flexibility, Increased muscle spasms, Decreased strength, Decreased activity tolerance  Visit Diagnosis: Acute pain of left knee  Acute pain of right knee  Muscle weakness (generalized)  Localized edema     Problem List Patient Active Problem List   Diagnosis Date Noted  . Pre-diabetes 03/29/2016  . Cholelithiasis with chronic cholecystitis 02/04/2016  . Chronic cholecystitis with calculus 02/02/2016  . Esophagitis 09/03/2015  . Liver fibrosis ([place]) 09/03/2015  . History of hepatitis C- cured 2017 07/22/2015  . B12 deficiency 11/07/2014  . Plantar fasciitis of right foot 08/13/2014  . Deformity of metatarsal bone of right foot 08/13/2014  . Dyspnea 02/08/2013  . HSV antigen DIF positive 02/09/2011  . HIATAL HERNIA 07/13/2010  . NONSPEC ELEVATION OF LEVELS OF TRANSAMINASE/LDH  04/16/2010  . ESOPHAGEAL STRICTURE 02/05/2010  . RHINITIS 09/25/2008  . CHOLELITHIASIS 08/05/2008  . Essential hypertension 04/05/2007  . Nonspecific abnormal electrocardiogram (ECG) (EKG) 03/13/2007    Bess Harvest, PTA 07/06/16 6:05 PM  [facility] [address]  [place], [place], [address] Phone: [phone]   Fax:  [phone]  Name: Anna [name] MRN: [id number] Date of Birth: 10/01/1950

## 2016-07-14 ENCOUNTER — Ambulatory Visit: Payer: BLUE CROSS/BLUE SHIELD | Admitting: Physical Therapy

## 2016-07-14 DIAGNOSIS — R600 Localized edema: Secondary | ICD-10-CM

## 2016-07-14 DIAGNOSIS — M25562 Pain in left knee: Secondary | ICD-10-CM | POA: Diagnosis not present

## 2016-07-14 DIAGNOSIS — M6281 Muscle weakness (generalized): Secondary | ICD-10-CM

## 2016-07-14 DIAGNOSIS — M25561 Pain in right knee: Secondary | ICD-10-CM

## 2016-07-14 NOTE — Therapy (Signed)
[facility] [address]  [place], [place], [address] Phone: [phone]   Fax:  [phone]  Physical Therapy Treatment  Patient Details  Name: Anna [name] MRN: [id number] Date of Birth: [birth year] Referring Provider: Judith Part. [place], [place]  Encounter Date: 07/14/2016      PT End of Session - 07/14/16 1445    Visit Number 5   Number of Visits 12   Date for PT Re-Evaluation 08/12/16   Authorization Type BCBS   Authorization - Number of Visits 60   PT Start Time [id number]   PT Stop Time 1532   PT Time Calculation (min) 47 min   Activity Tolerance Patient tolerated treatment well   Behavior During Therapy WFL for tasks assessed/performed      Past Medical History:  Diagnosis Date  . Arthritis    hands  . Atypical chest pain    Negative nuclear stress test  . Cholelithiasis    Single stone  . Cholelithiasis   . Dry eyes   . Dysrhythmia   . Esophageal stricture   . Family history of adverse reaction to anesthesia    pts sister had severe PONV  . Floaters    bilat   . GERD (gastroesophageal reflux disease)   . Hematuria, microscopic   . Hemorrhoids, internal    and external  . Hepatitis C 06/2015  . Hiatal hernia    EGD 07-2008  . Hypertension   . Numbness and tingling in hands    bilat   . Rhinitis    seasonal  . Tinnitus   . Vitamin D deficiency     Past Surgical History:  Procedure Laterality Date  . ABDOMINAL HYSTERECTOMY     fibroids &dysfunctional menes; USO  . APPENDECTOMY    . CHOLECYSTECTOMY N/A 02/04/2016   Procedure: LAPAROSCOPIC CHOLECYSTECTOMY ;  Surgeon: Armandina Gemma, MD;  Location: [facility];  Service: General;  Laterality: N/A;  . COLONOSCOPY  2007   Dr. Henrene Pastor, due 2017  . CT Angiogram  01/2006   Negative for PE  . G2 P1    . REFRACTIVE SURGERY    . UPPER GI ENDOSCOPY  07/2010   Esophagitis; distal esophageal ringlike structure    There were no vitals filed for this  visit.      Subjective Assessment - 07/14/16 1448    Subjective Pt noting continued low back and L hip pain which limits tolerance for some of HEP exercises. Knee pain better but still notes pain with stooping and bending. States most recent taping only lasted 2 days, but feels taping is helping.   Patient Stated Goals "hope to be able to bend knees w/o an get back to my exercise routine"   Currently in Pain? No/denies                         [facility] Adult PT Treatment/Exercise - 07/14/16 1445      Knee/Hip Exercises: Stretches   Hip Flexor Stretch Left;30 seconds;2 reps   Hip Flexor Stretch Limitations mod thomas with gentle overpressure by PT     Knee/Hip Exercises: Aerobic   Recumbent Bike lvl 2 x 6'     Knee/Hip Exercises: Standing   Heel Raises Both;20 reps;3 seconds   Wall Squat 10 reps;3 seconds   Wall Squat Limitations + ball squeeze   Other Standing Knee Exercises B side-stepping and fwd monster walk with green looped TB 2x53ft each  mild  increased pain noted in L knee with monster walk     Knee/Hip Exercises: Supine   Bridges 5 reps   Bridges Limitations + HS curl with heels on peanut ball   Bridges with Clamshell Both;10 reps  alt hip ABD/ER with green TB     Manual Therapy   Manual Therapy Myofascial release;Taping   Myofascial Release L iliopsoas     Kinesiotix   Create Space Chondromalacia patella pattern to B knees - 2 strips 30% medial and lateral to patella crossing at tibial tuberosity & distal quad + horiz strip 50% across patellar tendon                  PT Short Term Goals - 07/06/16 1749      PT SHORT TERM GOAL #1   Title Independent with initial HEP by 07/15/16   Status Achieved           PT Long Term Goals - 06/30/16 1705      PT LONG TERM GOAL #1   Title Independent with advanced HEP/workout routine as indicated by 08/12/16   Status On-going     PT LONG TERM GOAL #2   Title B hip and knee strength >/= 4+/5 by  08/12/16   Status On-going     PT LONG TERM GOAL #3   Title Pt will tolerate sitting for >1 hr w/o limitation due to B knee pain by 08/12/16   Status On-going     PT LONG TERM GOAL #4   Title Pt will be able to resume working out w/o limitation due to B knee pain by 08/12/16   Status On-going               Plan - 07/14/16 1450    Clinical Impression Statement Pt reporting improvement in knee pain other than when kneeling or squatting/stooping, but noting continued L anterior hip pain with ttp present along L iliopsoas. Performed gentle STM/MFR to this area followed by hip flexor stretching with pull/stretch noted but not increased pain as she experiences when performing this stretch at home. Progressed exercises to include more lateral hip strengthening as well as introduction of standing exercises. Mild discomfort noted in L knee with monster walk and increased pain with TRX squat but able to perform wall squat w/o pain.   Rehab Potential Good   PT Treatment/Interventions Patient/family education;Therapeutic exercise;Neuromuscular re-education;Manual techniques;Taping;Therapeutic activities;Electrical Stimulation;Moist Heat;Cryotherapy;Vasopneumatic Device;Iontophoresis 4mg /ml Dexamethasone;ADLs/Self Care Home Management   PT Next Visit Plan Review and progress HEP as indicated; ITB & quad/hip flexor flexibility, core/LE strengthening; Continued kinesiotaping for chondromalcia if benefit noted   Consulted and Agree with Plan of Care Patient      Patient will benefit from skilled therapeutic intervention in order to improve the following deficits and impairments:  Pain, Impaired flexibility, Increased muscle spasms, Decreased strength, Decreased activity tolerance  Visit Diagnosis: Acute pain of left knee  Acute pain of right knee  Muscle weakness (generalized)  Localized edema     Problem List Patient Active Problem List   Diagnosis Date Noted  . Pre-diabetes 03/29/2016   . Cholelithiasis with chronic cholecystitis 02/04/2016  . Chronic cholecystitis with calculus 02/02/2016  . Esophagitis 09/03/2015  . Liver fibrosis ([place]) 09/03/2015  . History of hepatitis C- cured 2017 07/22/2015  . B12 deficiency 11/07/2014  . Plantar fasciitis of right foot 08/13/2014  . Deformity of metatarsal bone of right foot 08/13/2014  . Dyspnea 02/08/2013  . HSV antigen DIF positive 02/09/2011  .  HIATAL HERNIA 07/13/2010  . NONSPEC ELEVATION OF LEVELS OF TRANSAMINASE/LDH 04/16/2010  . ESOPHAGEAL STRICTURE 02/05/2010  . RHINITIS 09/25/2008  . CHOLELITHIASIS 08/05/2008  . Essential hypertension 04/05/2007  . Nonspecific abnormal electrocardiogram (ECG) (EKG) 03/13/2007    Percival Spanish, PT, MPT 07/14/2016, 3:45 PM  [facility] [address]  [place], [place], [address] Phone: [phone]   Fax:  [phone]  Name: Sander Stogner MRN: [id number] Date of Birth: [birth year]

## 2016-07-15 ENCOUNTER — Ambulatory Visit: Payer: BLUE CROSS/BLUE SHIELD

## 2016-07-15 DIAGNOSIS — M25561 Pain in right knee: Secondary | ICD-10-CM

## 2016-07-15 DIAGNOSIS — M25562 Pain in left knee: Secondary | ICD-10-CM | POA: Diagnosis not present

## 2016-07-15 DIAGNOSIS — M6281 Muscle weakness (generalized): Secondary | ICD-10-CM

## 2016-07-15 DIAGNOSIS — R600 Localized edema: Secondary | ICD-10-CM

## 2016-07-15 NOTE — Therapy (Signed)
[facility] [address]  [place], [place], [address] Phone: [phone]   Fax:  [phone]  Physical Therapy Treatment  Patient Details  Name: Anna [name] MRN: [id number] Date of Birth: September 08, 1950 Referring Provider: Judith Part. [place], [place]  Encounter Date: 07/15/2016      PT End of Session - 07/15/16 1114    Visit Number 6   Number of Visits 12   Date for PT Re-Evaluation 08/12/16   Authorization Type BCBS   Authorization - Number of Visits 60   PT Start Time 1101   PT Stop Time 1141   PT Time Calculation (min) 40 min   Activity Tolerance Patient tolerated treatment well   Behavior During Therapy WFL for tasks assessed/performed      Past Medical History:  Diagnosis Date  . Arthritis    hands  . Atypical chest pain    Negative nuclear stress test  . Cholelithiasis    Single stone  . Cholelithiasis   . Dry eyes   . Dysrhythmia   . Esophageal stricture   . Family history of adverse reaction to anesthesia    pts sister had severe PONV  . Floaters    bilat   . GERD (gastroesophageal reflux disease)   . Hematuria, microscopic   . Hemorrhoids, internal    and external  . Hepatitis C 06/2015  . Hiatal hernia    EGD 07-2008  . Hypertension   . Numbness and tingling in hands    bilat   . Rhinitis    seasonal  . Tinnitus   . Vitamin D deficiency     Past Surgical History:  Procedure Laterality Date  . ABDOMINAL HYSTERECTOMY     fibroids &dysfunctional menes; USO  . APPENDECTOMY    . CHOLECYSTECTOMY N/A 02/04/2016   Procedure: LAPAROSCOPIC CHOLECYSTECTOMY ;  Surgeon: Armandina Gemma, MD;  Location: [facility];  Service: General;  Laterality: N/A;  . COLONOSCOPY  2007   Dr. Henrene Pastor, due 2017  . CT Angiogram  01/2006   Negative for PE  . G2 P1    . REFRACTIVE SURGERY    . UPPER GI ENDOSCOPY  07/2010   Esophagitis; distal esophageal ringlike structure    There were no vitals filed for this  visit.      Subjective Assessment - 07/15/16 1105    Subjective Pt. reporting she returns to work on Tuesday.  Pt. reporting driving 2 hours over christmas was tolerable.  Pt. reporting taping horizontal strip coming loose on R kne today however still reporting benefit.     Patient Stated Goals "hope to be able to bend knees w/o an get back to my exercise routine"   Currently in Pain? No/denies   Pain Score 0-No pain   Multiple Pain Sites No            [facility] Adult PT Treatment/Exercise - 07/15/16 1115      Knee/Hip Exercises: Stretches   Hip Flexor Stretch Left;30 seconds;2 reps  B   Hip Flexor Stretch Limitations mod thomas with gentle overpressure by PT     Knee/Hip Exercises: Aerobic   Stationary Bike Recumbent bike: level 4, 6 min      Knee/Hip Exercises: Standing   Functional Squat 1 set;10 reps   Functional Squat Limitations TRX    Other Standing Knee Exercises B side-stepping and fwd monster walk with green looped TB 2 x 39ft each     Knee/Hip Exercises: Supine  Bridges 1 set;Both;10 reps   Bridges Limitations + HS curl with heels on peanut ball   Bridges with Clamshell Both;15 reps  B sustained B hip abd/ER with blue TB     Knee/Hip Exercises: Sidelying   Hip ABduction AROM;1 set;Right;Left;20 reps   Hip ABduction Limitations 2#   Clams B sidelying clam with blue TB x 15 reps each side                   PT Short Term Goals - 07/06/16 1749      PT SHORT TERM GOAL #1   Title Independent with initial HEP by 07/15/16   Status Achieved           PT Long Term Goals - 06/30/16 1705      PT LONG TERM GOAL #1   Title Independent with advanced HEP/workout routine as indicated by 08/12/16   Status On-going     PT LONG TERM GOAL #2   Title B hip and knee strength >/= 4+/5 by 08/12/16   Status On-going     PT LONG TERM GOAL #3   Title Pt will tolerate sitting for >1 hr w/o limitation due to B knee pain by 08/12/16   Status On-going     PT LONG  TERM GOAL #4   Title Pt will be able to resume working out w/o limitation due to B knee pain by 08/12/16   Status On-going               Plan - 07/15/16 1117    Clinical Impression Statement Pt. reporting she is somewhat sore following yesterday's treatment however feels taping to B knees is still helping decrease pain.  Taping on R knee coming loose thus cross-strip reapplied today.  Pt. performed well with all LE strengthening and stretching therex today with only mild B knee pain with backwards monster walk.  Pt. reporting most benefit from therapy at this point from taping to B knees.  MD f/u upcoming on 1.8.17   PT Treatment/Interventions Patient/family education;Therapeutic exercise;Neuromuscular re-education;Manual techniques;Taping;Therapeutic activities;Electrical Stimulation;Moist Heat;Cryotherapy;Vasopneumatic Device;Iontophoresis 4mg /ml Dexamethasone;ADLs/Self Care Home Management   PT Next Visit Plan Review and progress HEP as indicated; ITB & quad/hip flexor flexibility, core/LE strengthening; Continued kinesiotaping for chondromalcia if benefit noted      Patient will benefit from skilled therapeutic intervention in order to improve the following deficits and impairments:  Pain, Impaired flexibility, Increased muscle spasms, Decreased strength, Decreased activity tolerance  Visit Diagnosis: Acute pain of left knee  Acute pain of right knee  Muscle weakness (generalized)  Localized edema     Problem List Patient Active Problem List   Diagnosis Date Noted  . Pre-diabetes 03/29/2016  . Cholelithiasis with chronic cholecystitis 02/04/2016  . Chronic cholecystitis with calculus 02/02/2016  . Esophagitis 09/03/2015  . Liver fibrosis ([place]) 09/03/2015  . History of hepatitis C- cured 2017 07/22/2015  . B12 deficiency 11/07/2014  . Plantar fasciitis of right foot 08/13/2014  . Deformity of metatarsal bone of right foot 08/13/2014  . Dyspnea 02/08/2013  . HSV  antigen DIF positive 02/09/2011  . HIATAL HERNIA 07/13/2010  . NONSPEC ELEVATION OF LEVELS OF TRANSAMINASE/LDH 04/16/2010  . ESOPHAGEAL STRICTURE 02/05/2010  . RHINITIS 09/25/2008  . CHOLELITHIASIS 08/05/2008  . Essential hypertension 04/05/2007  . Nonspecific abnormal electrocardiogram (ECG) (EKG) 03/13/2007    Bess Harvest, PTA 07/15/16 12:08 PM  [facility] [address]  [place], [place], [address] Phone:  [phone]   Fax:  [phone]  Name: Anna [name] MRN: [id number] Date of Birth: 1951/02/01

## 2016-07-20 ENCOUNTER — Ambulatory Visit: Payer: BLUE CROSS/BLUE SHIELD | Attending: Physician Assistant

## 2016-07-20 DIAGNOSIS — M6281 Muscle weakness (generalized): Secondary | ICD-10-CM | POA: Diagnosis present

## 2016-07-20 DIAGNOSIS — R600 Localized edema: Secondary | ICD-10-CM

## 2016-07-20 DIAGNOSIS — M25561 Pain in right knee: Secondary | ICD-10-CM

## 2016-07-20 DIAGNOSIS — M25562 Pain in left knee: Secondary | ICD-10-CM | POA: Diagnosis present

## 2016-07-20 NOTE — Therapy (Signed)
[facility] [address]  [place], [place], [address] Phone: [phone]   Fax:  [phone]  Physical Therapy Treatment  Patient Details  Name: Anna [name] MRN: [id number] Date of Birth: 1950/12/30 Referring Provider: Judith Part. [place], [place]  Encounter Date: 07/20/2016      PT End of Session - 07/20/16 1623    Visit Number 7   Number of Visits 12   Date for PT Re-Evaluation 08/12/16   Authorization Type BCBS   Authorization - Number of Visits 60   PT Start Time 1620   PT Stop Time 1700   PT Time Calculation (min) 40 min   Activity Tolerance Patient tolerated treatment well   Behavior During Therapy WFL for tasks assessed/performed      Past Medical History:  Diagnosis Date  . Arthritis    hands  . Atypical chest pain    Negative nuclear stress test  . Cholelithiasis    Single stone  . Cholelithiasis   . Dry eyes   . Dysrhythmia   . Esophageal stricture   . Family history of adverse reaction to anesthesia    pts sister had severe PONV  . Floaters    bilat   . GERD (gastroesophageal reflux disease)   . Hematuria, microscopic   . Hemorrhoids, internal    and external  . Hepatitis C 06/2015  . Hiatal hernia    EGD 07-2008  . Hypertension   . Numbness and tingling in hands    bilat   . Rhinitis    seasonal  . Tinnitus   . Vitamin D deficiency     Past Surgical History:  Procedure Laterality Date  . ABDOMINAL HYSTERECTOMY     fibroids &dysfunctional menes; USO  . APPENDECTOMY    . CHOLECYSTECTOMY N/A 02/04/2016   Procedure: LAPAROSCOPIC CHOLECYSTECTOMY ;  Surgeon: Armandina Gemma, MD;  Location: [facility];  Service: General;  Laterality: N/A;  . COLONOSCOPY  2007   Dr. Henrene Pastor, due 2017  . CT Angiogram  01/2006   Negative for PE  . G2 P1    . REFRACTIVE SURGERY    . UPPER GI ENDOSCOPY  07/2010   Esophagitis; distal esophageal ringlike structure    There were no vitals filed for this  visit.      Subjective Assessment - 07/20/16 1626    Subjective Pt. reporting yesterday was the first day back at work.  Pt. reporting static sitting at work is painful to B knees however, "not too bad".  Pt. now reporting able to kneel on hard surfaces with less pain.     Patient Stated Goals "hope to be able to bend knees w/o an get back to my exercise routine"   Currently in Pain? No/denies   Pain Score 0-No pain   Multiple Pain Sites No            [facility] Adult PT Treatment/Exercise - 07/20/16 1631      Knee/Hip Exercises: Aerobic   Nustep NuStep: lvl 6 x 6'     Knee/Hip Exercises: Standing   Functional Squat 2 sets;15 reps;3 seconds  pain free today    Functional Squat Limitations TRX    Other Standing Knee Exercises B side-stepping and fwd monster walk with blue looped TB 2 x 52ft each   Other Standing Knee Exercises B Fitter machine hip extension (1 blue, 1 black band) x 15 reps each side      Knee/Hip Exercises: Seated  Other Seated Knee/Hip Exercises B fitter leg press (2 black bands) x 15 reps      Manual Therapy   Manual Therapy Taping   Manual therapy comments taping cross-strips to B knees due to report of these coming loose   Kinesiotex [employer]                  PT Short Term Goals - 07/06/16 1749      PT SHORT TERM GOAL #1   Title Independent with initial HEP by 07/15/16   Status Achieved           PT Long Term Goals - 07/20/16 1815      PT LONG TERM GOAL #1   Title Independent with advanced HEP/workout routine as indicated by 08/12/16   Status On-going     PT LONG TERM GOAL #2   Title B hip and knee strength >/= 4+/5 by 08/12/16   Status On-going     PT LONG TERM GOAL #3   Title Pt will tolerate sitting for >1 hr w/o limitation due to B knee pain by 08/12/16   Status On-going     PT LONG TERM GOAL #4   Title Pt will be able to resume working out w/o limitation due to B knee pain by 08/12/16   Status Achieved  1.3.18: pt.  reporting able to return to work yesterday only with B knee stiffness with prolonged sitting at work.                 Plan - 07/20/16 1643    Clinical Impression Statement Pt. tolerated all standing hip/knee strengthening activity well today with only mild L knee pain with max effort quad strengthening activities.  Pt. reporting taping still intact to B knees with exception of "cross strips".  B knee cross strips reapplied today due to pt. report of continued benefit from taping.  Pt. now reporting being able to kneel on B knees with reduced pain and able to return to work yesterday with only knee stiffness with prolonged sitting.  Pt. progressing well at this point with MD f/u upcoming on 1.8.18.   PT Treatment/Interventions Patient/family education;Therapeutic exercise;Neuromuscular re-education;Manual techniques;Taping;Therapeutic activities;Electrical Stimulation;Moist Heat;Cryotherapy;Vasopneumatic Device;Iontophoresis 4mg /ml Dexamethasone;ADLs/Self Care Home Management   PT Next Visit Plan Review and progress HEP as indicated; ITB & quad/hip flexor flexibility, core/LE strengthening; Continued kinesiotaping for chondromalcia if benefit noted      Patient will benefit from skilled therapeutic intervention in order to improve the following deficits and impairments:  Pain, Impaired flexibility, Increased muscle spasms, Decreased strength, Decreased activity tolerance  Visit Diagnosis: Acute pain of left knee  Acute pain of right knee  Muscle weakness (generalized)  Localized edema     Problem List Patient Active Problem List   Diagnosis Date Noted  . Pre-diabetes 03/29/2016  . Cholelithiasis with chronic cholecystitis 02/04/2016  . Chronic cholecystitis with calculus 02/02/2016  . Esophagitis 09/03/2015  . Liver fibrosis ([place]) 09/03/2015  . History of hepatitis C- cured 2017 07/22/2015  . B12 deficiency 11/07/2014  . Plantar fasciitis of right foot 08/13/2014  .  Deformity of metatarsal bone of right foot 08/13/2014  . Dyspnea 02/08/2013  . HSV antigen DIF positive 02/09/2011  . HIATAL HERNIA 07/13/2010  . NONSPEC ELEVATION OF LEVELS OF TRANSAMINASE/LDH 04/16/2010  . ESOPHAGEAL STRICTURE 02/05/2010  . RHINITIS 09/25/2008  . CHOLELITHIASIS 08/05/2008  . Essential hypertension 04/05/2007  . Nonspecific abnormal electrocardiogram (ECG) (EKG) 03/13/2007    Bess Harvest, PTA 07/20/16 6:23  PM  [facility] [address]  [place], [place], [address] Phone: [phone]   Fax:  [phone]  Name: Anna [name] MRN: [id number] Date of Birth: [birth year]

## 2016-07-22 ENCOUNTER — Ambulatory Visit: Payer: BLUE CROSS/BLUE SHIELD

## 2016-07-22 DIAGNOSIS — M6281 Muscle weakness (generalized): Secondary | ICD-10-CM

## 2016-07-22 DIAGNOSIS — M25562 Pain in left knee: Secondary | ICD-10-CM

## 2016-07-22 DIAGNOSIS — M25561 Pain in right knee: Secondary | ICD-10-CM

## 2016-07-22 DIAGNOSIS — R600 Localized edema: Secondary | ICD-10-CM

## 2016-07-22 NOTE — Therapy (Addendum)
[facility] [address]  [place], [place], [address] Phone: [phone]   Fax:  [phone]  Physical Therapy Treatment  Patient Details  Name: Anna [name] MRN: [id number] Date of Birth: 1951-07-03 Referring Provider: Judith Part. [place], [place]  Encounter Date: 07/22/2016      PT End of Session - 07/22/16 0939    Visit Number 8   Number of Visits 12   Date for PT Re-Evaluation 08/12/16   Authorization Type BCBS   Authorization - Number of Visits 60   PT Start Time 0932   PT Stop Time 1016   PT Time Calculation (min) 44 min   Activity Tolerance Patient tolerated treatment well   Behavior During Therapy WFL for tasks assessed/performed      Past Medical History:  Diagnosis Date  . Arthritis    hands  . Atypical chest pain    Negative nuclear stress test  . Cholelithiasis    Single stone  . Cholelithiasis   . Dry eyes   . Dysrhythmia   . Esophageal stricture   . Family history of adverse reaction to anesthesia    pts sister had severe PONV  . Floaters    bilat   . GERD (gastroesophageal reflux disease)   . Hematuria, microscopic   . Hemorrhoids, internal    and external  . Hepatitis C 06/2015  . Hiatal hernia    EGD 07-2008  . Hypertension   . Numbness and tingling in hands    bilat   . Rhinitis    seasonal  . Tinnitus   . Vitamin D deficiency     Past Surgical History:  Procedure Laterality Date  . ABDOMINAL HYSTERECTOMY     fibroids &dysfunctional menes; USO  . APPENDECTOMY    . CHOLECYSTECTOMY N/A 02/04/2016   Procedure: LAPAROSCOPIC CHOLECYSTECTOMY ;  Surgeon: Armandina Gemma, MD;  Location: [facility];  Service: General;  Laterality: N/A;  . COLONOSCOPY  2007   Dr. Henrene Pastor, due 2017  . CT Angiogram  01/2006   Negative for PE  . G2 P1    . REFRACTIVE SURGERY    . UPPER GI ENDOSCOPY  07/2010   Esophagitis; distal esophageal ringlike structure    There were no vitals filed for this  visit.      Subjective Assessment - 07/22/16 0938    Subjective Pt. reporting today is her 4th day back to work and she feels the knees are doing better with prolonged sitting with less pain.     Patient Stated Goals "hope to be able to bend knees w/o an get back to my exercise routine"   Currently in Pain? No/denies   Pain Score 0-No pain   Multiple Pain Sites No            [facility] PT Assessment - 07/22/16 0943      Strength   Strength Assessment Site Hip   Right/Left Hip Right;Left   Right Hip Flexion 4+/5   Right Hip Extension 4/5   Right Hip External Rotation  4/5   Right Hip Internal Rotation 4/5   Right Hip ABduction 4+/5   Right Hip ADduction 4/5   Left Hip Flexion 4+/5   Left Hip Extension 4+/5   Left Hip External Rotation 4/5   Left Hip Internal Rotation 4-/5   Left Hip ABduction 4+/5   Left Hip ADduction 4/5   Right/Left Knee Right;Left   Right Knee Flexion 4+/5   Right Knee Extension  4+/5   Left Knee Flexion 4/5   Left Knee Extension 4+/5             PT Education - 07/22/16 1036    Education provided Yes   Education Details SL bridge with adduction pillow squeeze, HS curl + bridge on p-ball (pt. has 55cm at home), adductional ball squeeze with bridge   Person(s) Educated Patient   Methods Explanation;Demonstration;Handout;Verbal cues   Comprehension Verbalized understanding;Returned demonstration;Verbal cues required;Need further instruction          PT Short Term Goals - 07/06/16 1749      PT SHORT TERM GOAL #1   Title Independent with initial HEP by 07/15/16   Status Achieved           PT Long Term Goals - 07/22/16 0942      PT LONG TERM GOAL #1   Title Independent with advanced HEP/workout routine as indicated by 08/12/16   Status Partially Met  1.5.18: met for current HEP however updated on 1.5.18 with additional activities      PT LONG TERM GOAL #2   Title B hip and knee strength >/= 4+/5 by 08/12/16   Status Partially Met   1.5.18: met with exception of B hip adduction, ER, IR, R hip extension, and L knee flexion still 4/5     PT LONG TERM GOAL #3   Title Pt will tolerate sitting for >1 hr w/o limitation due to B knee pain by 08/12/16   Status Achieved  1.5.18: Pt. reporting she is able to sit for a, "couple of hours" without pain.       PT LONG TERM GOAL #4   Title Pt will be able to resume working out w/o limitation due to B knee pain by 08/12/16   Status Partially Met  1.5.18: pt. reporting able to return to work yesterday only with mild B knee pain with prolonged sitting at work.  Pt. still feels she is slightly limited at work by B knee pain.                   Plan - 07/22/16 1002    Clinical Impression Statement Today's treatment focused on goal testing to prepare for upcoming MD f/u on 1.8.18.  Pt. still demonstrating some hip/knee weaknesses with strength testing today.  HEP updated with focus on hip extension, adduction, and knee flexion strengthening activities to target remaining LE weakness.  Pt. instructed to continue focus on B clamshell activity to strengthen ER at B hips.  Pt. instructed to bring in initial HEP handout to review for appropriateness next visit.  Pt. now able to sit for over one hour at work without B knee pain and reports knees are only mildly limiting her at work.  Will plan to monitor pt. response to updated HEP next visit.  Pt. progressing well and will continue to benefit from further skilled therapy to improve remaining LE weaknesses to reduce B knee pain and improve functional capacity.   PT Treatment/Interventions Patient/family education;Therapeutic exercise;Neuromuscular re-education;Manual techniques;Taping;Therapeutic activities;Electrical Stimulation;Moist Heat;Cryotherapy;Vasopneumatic Device;Iontophoresis 56m/ml Dexamethasone;ADLs/Self Care Home Management   PT Next Visit Plan Review updated HEP; ITB & quad/hip flexor flexibility, core/LE strengthening; Continued  kinesiotaping for chondromalcia if benefit noted      Patient will benefit from skilled therapeutic intervention in order to improve the following deficits and impairments:  Pain, Impaired flexibility, Increased muscle spasms, Decreased strength, Decreased activity tolerance  Visit Diagnosis: Acute pain of left knee  Acute pain of  right knee  Muscle weakness (generalized)  Localized edema     Problem List Patient Active Problem List   Diagnosis Date Noted  . Pre-diabetes 03/29/2016  . Cholelithiasis with chronic cholecystitis 02/04/2016  . Chronic cholecystitis with calculus 02/02/2016  . Esophagitis 09/03/2015  . Liver fibrosis ([place]) 09/03/2015  . History of hepatitis C- cured 2017 07/22/2015  . B12 deficiency 11/07/2014  . Plantar fasciitis of right foot 08/13/2014  . Deformity of metatarsal bone of right foot 08/13/2014  . Dyspnea 02/08/2013  . HSV antigen DIF positive 02/09/2011  . HIATAL HERNIA 07/13/2010  . NONSPEC ELEVATION OF LEVELS OF TRANSAMINASE/LDH 04/16/2010  . ESOPHAGEAL STRICTURE 02/05/2010  . RHINITIS 09/25/2008  . CHOLELITHIASIS 08/05/2008  . Essential hypertension 04/05/2007  . Nonspecific abnormal electrocardiogram (ECG) (EKG) 03/13/2007    Bess Harvest, PTA 07/22/16 10:38 AM  [facility] [address]  [place], [place], [address] Phone: [phone]   Fax:  [phone]  Name: Anna [name] MRN: [id number] Date of Birth: 04-03-1951   PHYSICAL THERAPY DISCHARGE SUMMARY  Visits from Start of Care: 8  Current functional level related to goals / functional outcomes:   As of last treatment visit, pt had demonstrated good progress with PT with overall improvements in knee pain even after return to working. B LE strength was improved, but some mild weakness still noted proximally at hips. All goals were at least partially met at the time. Pt saw MD on 07/25/16, who released her at the time,  therefore will proceed with discharge from PT.   Remaining deficits:   As above.   Education / Equipment:   HEP  Plan: Patient agrees to discharge.  Patient goals were partially met. Patient is being discharged due to the physician's request.  ?????    Percival Spanish, PT, MPT 08/08/16, 9:34 AM  [facility] [place], [place], [address] Phone: [phone]   Fax:  [phone]

## 2016-08-01 MED FILL — LOSARTAN POTASSIUM 100 MG T: 100 | 30 days supply | Qty: 30 | Fill #4

## 2016-08-01 MED FILL — AMLODIPINE BESYLATE 5 MG TA: 5 | 30 days supply | Qty: 30 | Fill #4

## 2016-08-11 ENCOUNTER — Other Ambulatory Visit: Payer: Self-pay | Admitting: Emergency Medicine

## 2016-08-11 ENCOUNTER — Telehealth: Payer: Self-pay | Admitting: Family Medicine

## 2016-08-11 MED ORDER — LOSARTAN POTASSIUM 100 MG PO TABS: 100.0000 mg | ORAL_TABLET | Freq: Every day | ORAL | 1 refills | Status: DC

## 2016-08-11 MED ORDER — AMLODIPINE BESYLATE 5 MG PO TABS: 5.0000 mg | ORAL_TABLET | Freq: Every day | ORAL | 1 refills | Status: DC

## 2016-08-11 NOTE — Telephone Encounter (Signed)
Refills sent to [facility] per pt request. Pt has been  Informed.

## 2016-08-11 NOTE — Telephone Encounter (Signed)
Patient is requesting a refill of losartan (COZAAR) 100 MG tablet and amLODipine (NORVASC) 5 MG tablet Patient would like a call when these are sent in. Please advise  **New Pharmacy**  Pharmacy: [facility]   Patient phone: [phone]

## 2016-08-24 ENCOUNTER — Ambulatory Visit (HOSPITAL_COMMUNITY)
Admission: RE | Admit: 2016-08-24 | Discharge: 2016-08-24 | Disposition: A | Discharge status: Home / Self Care | Payer: BLUE CROSS/BLUE SHIELD | Source: Ambulatory Visit | Attending: Internal Medicine | Admitting: Internal Medicine

## 2016-08-24 DIAGNOSIS — K7400 Hepatic fibrosis, unspecified: Secondary | ICD-10-CM

## 2016-09-06 ENCOUNTER — Ambulatory Visit (INDEPENDENT_AMBULATORY_CARE_PROVIDER_SITE_OTHER): Payer: BLUE CROSS/BLUE SHIELD | Admitting: Internal Medicine

## 2016-09-06 ENCOUNTER — Encounter: Payer: Self-pay | Admitting: Internal Medicine

## 2016-09-06 DIAGNOSIS — K7400 Hepatic fibrosis, unspecified: Secondary | ICD-10-CM

## 2016-09-06 DIAGNOSIS — Z8619 Personal history of other infectious and parasitic diseases: Secondary | ICD-10-CM | POA: Diagnosis not present
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
## 2016-09-06 NOTE — Progress Notes (Signed)
   Subjective:    Patient ID: Anna [name], female    DOB: Nov 10, 1950, 66 y.o.   MRN: [id number]  HPI Here for follow up of HCV.  Has genotype 1b, viral load of 5.1 million and mild transaminitis to 47/46.  Took Harvoni and now completed over 9 months ago.  No new issues, no new risk factors.  Elastography with F2/3.   After treatment and SVR 12 viral load undetectable at last visit.   Had a repeat elastography and is F0/1, ultrasound without cirrhosis. Asking about risk to sexual partners.    Review of Systems  Constitutional: Negative for fatigue.  Gastrointestinal: Negative for diarrhea.  Skin: Negative for rash.       Objective:   Physical Exam  Constitutional: She appears well-developed and well-nourished. No distress.  Eyes: No scleral icterus.  Cardiovascular: Normal rate, regular rhythm and normal heart sounds.   No murmur heard. Pulmonary/Chest: Effort normal and breath sounds normal. No respiratory distress.  Skin: No rash noted.   SH: no alcohol       Assessment & Plan:

## 2016-09-06 NOTE — Assessment & Plan Note (Signed)
elastography now better and I suspect is about the same functionally.  No indication for [facility] screening.
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
## 2016-11-15 MED FILL — FLUTICASONE PROP 50 MCG SPR: 50 | 30 days supply | Qty: 16 | Fill #1

## 2016-12-16 HISTORY — PX: KNEE ARTHROSCOPY: SUR90

## 2016-12-29 MED FILL — HYDROCODON-APAP 5-325: 5-325 | 5 days supply | Qty: 60 | Fill #0

## 2016-12-29 MED FILL — CEPHALEXIN 500 MG CAPSULE: 500 | 4 days supply | Qty: 12 | Fill #0

## 2017-01-05 ENCOUNTER — Ambulatory Visit: Payer: BLUE CROSS/BLUE SHIELD | Attending: Specialist | Admitting: Physical Therapy

## 2017-01-05 DIAGNOSIS — M25562 Pain in left knee: Secondary | ICD-10-CM

## 2017-01-05 DIAGNOSIS — R262 Difficulty in walking, not elsewhere classified: Secondary | ICD-10-CM

## 2017-01-05 DIAGNOSIS — R600 Localized edema: Secondary | ICD-10-CM

## 2017-01-05 DIAGNOSIS — M25662 Stiffness of left knee, not elsewhere classified: Secondary | ICD-10-CM

## 2017-01-05 NOTE — Patient Instructions (Signed)
Hamstring Step 2   Left foot relaxed, knee straight, other leg bent, foot flat. Raise straight leg further upward to maximal range. Hold _30__ seconds. Relax leg completely down. Repeat _3__ times.  Strengthening: Straight Leg Raise (Phase 1)   Tighten muscles on front of right thigh, then lift leg __8__ inches from surface, keeping knee locked.  Repeat __15__ times per set. Do __2__ sets per session.   Bridge   Lie back, legs bent. Inhale, pressing hips up. Keeping ribs in, lengthen lower back. Exhale, rolling down along spine from top. Repeat __15__ times. Do __2__ sessions per day.   ABDUCTION: Side-Lying (Active)   Lie on right side, top leg straight. Raise top leg as far as possible. Use _0__ lbs. Complete _2__ sets of _15__ repetitions. Perform _2__ sessions per day.  EXTENSION: Prone - Knee Extended (Active)   Lie on stomach, legs straight. Lift left leg toward ceiling. Use _0__ lbs. Complete _2__ sets of _15__ repetitions. Perform _2__ sessions per day.  Long [employer]   Straighten operated leg and try to hold it __5__ seconds. Use __0__ lbs on ankle. Repeat __15__ times. Do __2__ sessions a day.  Mini Squat: Double Leg   With feet shoulder width apart, reach forward for balance and do a mini squat. Keep knees in line with second toe. Knees do not go past toes. Repeat _15__ times per set. Do _2__ sets per session.

## 2017-01-05 NOTE — Therapy (Signed)
[facility] [address]  [place], [place], [address] Phone: [phone]   Fax:  [phone]  Physical Therapy Evaluation  Patient Details  Name: Anna [name] MRN: [id number] Date of Birth: Feb 19, 1951 Referring Provider: Dr. Theda Sers  Encounter Date: 01/05/2017      PT End of Session - 01/05/17 1032    Visit Number 1   Number of Visits 12   Date for PT Re-Evaluation 02/16/17   PT Start Time 1000   PT Stop Time 1041   PT Time Calculation (min) 41 min   Activity Tolerance Patient tolerated treatment well   Behavior During Therapy [facility] for tasks assessed/performed      Past Medical History:  Diagnosis Date  . Arthritis    hands  . Atypical chest pain    Negative nuclear stress test  . Cholelithiasis    Single stone  . Cholelithiasis   . Dry eyes   . Dysrhythmia   . Esophageal stricture   . Family history of adverse reaction to anesthesia    pts sister had severe PONV  . Floaters    bilat   . GERD (gastroesophageal reflux disease)   . Hematuria, microscopic   . Hemorrhoids, internal    and external  . Hepatitis C 06/2015  . Hiatal hernia    EGD 07-2008  . Hypertension   . Numbness and tingling in hands    bilat   . Rhinitis    seasonal  . Tinnitus   . Vitamin D deficiency     Past Surgical History:  Procedure Laterality Date  . ABDOMINAL HYSTERECTOMY     fibroids &dysfunctional menes; USO  . APPENDECTOMY    . CHOLECYSTECTOMY N/A 02/04/2016   Procedure: LAPAROSCOPIC CHOLECYSTECTOMY ;  Surgeon: Armandina Gemma, MD;  Location: [facility];  Service: General;  Laterality: N/A;  . COLONOSCOPY  2007   Dr. Henrene Pastor, due 2017  . CT Angiogram  01/2006   Negative for PE  . G2 P1    . REFRACTIVE SURGERY    . UPPER GI ENDOSCOPY  07/2010   Esophagitis; distal esophageal ringlike structure    There were no vitals filed for this visit.       Subjective Assessment - 01/05/17 1003    Subjective Patient with  recent scope/debridement of L knee on 12/29/16 - had some arthritis and meniscus involvement. Seen by MD yesterday - start PT. Not walking with any assistive device. Some intermittent tingling into foot - made MD aware of this.    Pertinent History HTN   Limitations Standing;Walking   Patient Stated Goals return normal function of knee   Currently in Pain? Yes   Pain Score 5    Pain Location Knee   Pain Orientation Left   Pain Descriptors / Indicators Aching;Sore   Pain Type Surgical pain   Pain Onset 1 to 4 weeks ago   Pain Frequency Intermittent   Aggravating Factors  extending knee (anterior knee pain)   Pain Relieving Factors ice, pain meds prn            [facility] PT Assessment - 01/05/17 1007      Assessment   Medical Diagnosis L knee scope/deb   Referring Provider Dr. Theda Sers   Onset Date/Surgical Date 12/29/16   Next MD Visit --  ~4 weeks   Prior Therapy yes     Precautions   Precautions None     Restrictions   Weight Bearing Restrictions No  Balance Screen   Has the patient fallen in the past 6 months No   Has the patient had a decrease in activity level because of a fear of falling?  No   Is the patient reluctant to leave their home because of a fear of falling?  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   Leisure read, dance, church     Cognition   Overall Cognitive Status Within Functional Limits for tasks assessed     Observation/Other Assessments   Focus on Therapeutic Outcomes (FOTO)  Knee: 60 (40% limited, predicted 32% limited)     Sensation   Light Touch Appears Intact      Coordination   Gross Motor Movements are Fluid and Coordinated Yes     ROM / Strength   AROM / PROM / Strength AROM;PROM;Strength     AROM   AROM Assessment Site Knee   Right/Left Knee Right;Left   Right Knee Extension -3   Right Knee Flexion 132   Left Knee Extension 0   Left Knee Flexion 113     PROM   PROM Assessment Site Knee   Right/Left Knee Left   Left Knee Extension 0   Left Knee Flexion 120     Strength   Strength Assessment Site Hip;Knee;Ankle   Right/Left Hip Right;Left   Right Hip Flexion 4+/5   Right Hip Extension 4/5   Right Hip ABduction 4/5   Left Hip Flexion 4-/5   Left Hip Extension 4-/5   Left Hip ABduction 4/5   Right/Left Knee Right;Left   Right Knee Flexion 5/5   Right Knee Extension 5/5   Left Knee Flexion 4+/5   Left Knee Extension 4/5   Right/Left Ankle Right;Left   Right Ankle Dorsiflexion 5/5   Left Ankle Dorsiflexion 5/5     Flexibility   Soft Tissue Assessment /Muscle Length yes   Quadriceps L - moderate tightness     Palpation   Patella mobility good mobility all directions - no pain     Ambulation/Gait   Ambulation/Gait Yes   Ambulation/Gait Assistance 6: Modified independent (Device/Increase time)   Ambulation Distance (Feet) 150 Feet   Assistive device None   Gait Pattern Step-through pattern;Decreased step length - right;Decreased stance time - left;Decreased hip/knee flexion - left;Decreased weight shift to left;Antalgic   Ambulation Surface Level;Indoor            Objective measurements completed on examination: See above findings.          [facility] Adult PT Treatment/Exercise - 01/05/17 1007      Exercises   Exercises Knee/Hip     Knee/Hip Exercises: Stretches   Passive Hamstring Stretch Left;3 reps;30 seconds   Passive Hamstring Stretch Limitations supine with strap     Knee/Hip Exercises: Standing   Functional Squat 15 reps   Functional Squat Limitations B UE support - "mini"     Knee/Hip Exercises:  Seated   Long Arc Quad Left;15 reps   Long Arc Quad Limitations 5 sec hold     Knee/Hip Exercises: Supine   [employer]  Both;15 reps   Straight Leg Raises Left;15 reps     Knee/Hip Exercises: Sidelying   Hip ABduction Left;15 reps     Knee/Hip Exercises: Prone   Hip Extension Left;15 reps                PT Education - 01/05/17 1031    Education provided Yes   Education Details exam findings, POC, HEP   Person(s) Educated Patient   Methods Explanation;Demonstration;Handout   Comprehension Verbalized understanding;Returned demonstration;Need further instruction          PT Short Term Goals - 01/05/17 1057      PT SHORT TERM GOAL #1   Title Patient to be independent with initial HEP (01/19/17)   Status New     PT SHORT TERM GOAL #2   Title Patient to improve L knee AROM equal to that of R knee (01/19/17)   Status New           PT Long Term Goals - 01/05/17 1057      PT LONG TERM GOAL #1   Title Patient to be independent with advanced HEP (02/16/17)   Status New     PT LONG TERM GOAL #2   Title Patient to improve L LE strength to >/= 4+/5 (02/16/17)   Status New     PT LONG TERM GOAL #3   Title Patient to demonstrate proper gait mechanics with good heel toe gait pattern and no evidence of instability (02/16/17)   Status New     PT LONG TERM GOAL #4   Title Patient to demonstrate ability to ascend/descend 15 steps reciprocally without evidence of instability (02/16/17)   Status New                Plan - 01/05/17 1050    Clinical Impression Statement Patient is a 66 y/o female presenting to [facility] today for low complexity evaluation s/p L knee scope/debridement by Dr. Theda Sers on 12/29/16. Patient today some limited ROM and strength as compared to R LE. Patient ambulating without AD today with slight antalgic gait pattern with tendency to decreased weight shift to L LE as well as some reduced hip/knee flexion throughout swing phase of gait. Patient to benefit from  PT to address functional mobility limitations to allow for improved mobility as well as return to leisure activities without limitation.    Clinical Presentation Stable   Clinical Presentation due to: single co-morbidity likely not affecting patients status.   Clinical Decision Making Low   Rehab Potential Excellent   PT Frequency 2x / week   PT Duration 6 weeks   PT Treatment/Interventions ADLs/Self Care Home Management;Cryotherapy;Electrical Stimulation;Iontophoresis 4mg /ml Dexamethasone;Moist Heat;Ultrasound;Neuromuscular re-education;Balance training;Therapeutic exercise;Therapeutic activities;Functional mobility training;Stair training;Gait training;Patient/family education;Manual techniques;Passive range of motion;Dry needling;Taping;Vasopneumatic Device   Consulted and Agree with Plan of Care Patient      Patient will benefit from skilled therapeutic intervention in order to improve the following deficits and impairments:  Abnormal gait, Decreased activity tolerance, Decreased balance, Decreased range of motion, Decreased mobility, Decreased strength, Difficulty walking, Pain, Increased edema  Visit Diagnosis: Acute pain of left knee - Plan: PT plan of care cert/re-cert  Stiffness of left knee, not elsewhere classified - Plan: PT plan of care cert/re-cert  Difficulty in walking, not elsewhere classified - Plan: PT plan of care cert/re-cert  Localized edema - Plan: PT plan of care cert/re-cert     Problem List Patient Active Problem List   Diagnosis Date Noted  . Pre-diabetes 03/29/2016  .  Cholelithiasis with chronic cholecystitis 02/04/2016  . Chronic cholecystitis with calculus 02/02/2016  . Esophagitis 09/03/2015  . Liver fibrosis 09/03/2015  . History of hepatitis C- cured 2017 07/22/2015  . B12 deficiency 11/07/2014  . Plantar fasciitis of right foot 08/13/2014  . Deformity of metatarsal bone of right foot 08/13/2014  . Dyspnea 02/08/2013  . HSV antigen DIF positive  02/09/2011  . HIATAL HERNIA 07/13/2010  . NONSPEC ELEVATION OF LEVELS OF TRANSAMINASE/LDH 04/16/2010  . ESOPHAGEAL STRICTURE 02/05/2010  . RHINITIS 09/25/2008  . CHOLELITHIASIS 08/05/2008  . Essential hypertension 04/05/2007  . Nonspecific abnormal electrocardiogram (ECG) (EKG) 03/13/2007     Lanney Gins, PT, DPT 01/05/17 11:08 AM   [facility] [address]  [place], [place], [address] Phone: [phone]   Fax:  [phone]  Name: Montgomery Rothlisberger MRN: [id number] Date of Birth: Apr 21, 1951

## 2017-01-10 ENCOUNTER — Ambulatory Visit: Payer: BLUE CROSS/BLUE SHIELD

## 2017-01-10 DIAGNOSIS — R262 Difficulty in walking, not elsewhere classified: Secondary | ICD-10-CM

## 2017-01-10 DIAGNOSIS — M25662 Stiffness of left knee, not elsewhere classified: Secondary | ICD-10-CM

## 2017-01-10 DIAGNOSIS — M25562 Pain in left knee: Secondary | ICD-10-CM | POA: Diagnosis not present

## 2017-01-10 DIAGNOSIS — R600 Localized edema: Secondary | ICD-10-CM

## 2017-01-10 NOTE — Therapy (Addendum)
[facility] [address]  [place], [place], [address] Phone: [phone]   Fax:  [phone]  Physical Therapy Treatment  Patient Details  Name: Anna [name] MRN: [id number] Date of Birth: June 08, 1951 Referring Provider: Dr. Theda Sers   Encounter Date: 01/10/2017      PT End of Session - 01/10/17 0939    Visit Number 2   Number of Visits 12   Date for PT Re-Evaluation 02/16/17   PT Start Time 0930   PT Stop Time 1023   PT Time Calculation (min) 53 min   Activity Tolerance Patient tolerated treatment well   Behavior During Therapy [facility] for tasks assessed/performed      Past Medical History:  Diagnosis Date  . Arthritis    hands  . Atypical chest pain    Negative nuclear stress test  . Cholelithiasis    Single stone  . Cholelithiasis   . Dry eyes   . Dysrhythmia   . Esophageal stricture   . Family history of adverse reaction to anesthesia    pts sister had severe PONV  . Floaters    bilat   . GERD (gastroesophageal reflux disease)   . Hematuria, microscopic   . Hemorrhoids, internal    and external  . Hepatitis C 06/2015  . Hiatal hernia    EGD 07-2008  . Hypertension   . Numbness and tingling in hands    bilat   . Rhinitis    seasonal  . Tinnitus   . Vitamin D deficiency     Past Surgical History:  Procedure Laterality Date  . ABDOMINAL HYSTERECTOMY     fibroids &dysfunctional menes; USO  . APPENDECTOMY    . CHOLECYSTECTOMY N/A 02/04/2016   Procedure: LAPAROSCOPIC CHOLECYSTECTOMY ;  Surgeon: Armandina Gemma, MD;  Location: [facility];  Service: General;  Laterality: N/A;  . COLONOSCOPY  2007   Dr. Henrene Pastor, due 2017  . CT Angiogram  01/2006   Negative for PE  . G2 P1    . REFRACTIVE SURGERY    . UPPER GI ENDOSCOPY  07/2010   Esophagitis; distal esophageal ringlike structure    There were no vitals filed for this visit.      Subjective Assessment - 01/10/17 0934    Subjective Pt. noting she  was able to walk 1/2 miles twice over weekend without issue.     Patient Stated Goals return normal function of knee   Currently in Pain? Yes   Pain Score 5    Pain Location Knee   Pain Orientation Left   Pain Descriptors / Indicators Aching;Sore   Pain Frequency Intermittent   Multiple Pain Sites No            [facility] PT Assessment - 01/10/17 0942      Assessment   Referring Provider Dr. Theda Sers    Next MD Visit 7.18.18                     [facility] Adult PT Treatment/Exercise - 01/10/17 0949      Knee/Hip Exercises: Stretches   Passive Hamstring Stretch Left;30 seconds;2 reps   Passive Hamstring Stretch Limitations supine with strap   Hip Flexor Stretch Left;60 seconds;1 rep   Hip Flexor Stretch Limitations in mod thomas position with strap and LE on 4" step     Knee/Hip Exercises: Standing   Functional Squat 15 reps   Functional Squat Limitations B UE support - "mini"  3" hold  Knee/Hip Exercises: Seated   Long Arc Quad Left;15 reps   Long Arc Quad Limitations 5 sec hold   Hamstring Curl Left;15 reps   Hamstring Limitations with green TB; emphasis on slow eccentric     Knee/Hip Exercises: Supine   Bridges with [employer] 15 reps;Both  5" hold    Straight Leg Raises Left;15 reps   Straight Leg Raises Limitations 1#      Knee/Hip Exercises: Sidelying   Hip ABduction Left;20 reps   Hip ABduction Limitations 1#     Knee/Hip Exercises: Prone   Hip Extension Left;20 reps   Hip Extension Limitations 1#     Modalities   Modalities Vasopneumatic     Vasopneumatic   Number Minutes Vasopneumatic  10 minutes   Vasopnuematic Location  Knee  L    Vasopneumatic Pressure Medium   Vasopneumatic Temperature  Coldest Temp.                   PT Short Term Goals - 01/10/17 0945      PT SHORT TERM GOAL #1   Title Patient to be independent with initial HEP (01/19/17)   Status On-going     PT SHORT TERM GOAL #2   Title Patient to improve L  knee AROM equal to that of R knee (01/19/17)   Status On-going           PT Long Term Goals - 01/10/17 0945      PT LONG TERM GOAL #1   Title Patient to be independent with advanced HEP (02/16/17)   Status On-going     PT LONG TERM GOAL #2   Title Patient to improve L LE strength to >/= 4+/5 (02/16/17)   Status On-going     PT LONG TERM GOAL #3   Title Patient to demonstrate proper gait mechanics with good heel toe gait pattern and no evidence of instability (02/16/17)   Status On-going     PT LONG TERM GOAL #4   Title Patient to demonstrate ability to ascend/descend 15 steps reciprocally without evidence of instability (02/16/17)   Status On-going               Plan - 01/10/17 0945    Clinical Impression Statement Anna [name] doing well today.  Good overall technique with HEP review only requiring min cueing for technique.  Tolerated advancement of supine hip/knee strengthening activities without issue reporting decrease in pain level following therex.  Ice/compression applied to L knee to decrease post-exercise swelling and pain.     PT Treatment/Interventions ADLs/Self Care Home Management;Cryotherapy;Electrical Stimulation;Iontophoresis 4mg /ml Dexamethasone;Moist Heat;Ultrasound;Neuromuscular re-education;Balance training;Therapeutic exercise;Therapeutic activities;Functional mobility training;Stair training;Gait training;Patient/family education;Manual techniques;Passive range of motion;Dry needling;Taping;Vasopneumatic Device      Patient will benefit from skilled therapeutic intervention in order to improve the following deficits and impairments:  Abnormal gait, Decreased activity tolerance, Decreased balance, Decreased range of motion, Decreased mobility, Decreased strength, Difficulty walking, Pain, Increased edema  Visit Diagnosis: Acute pain of left knee  Stiffness of left knee, not elsewhere classified  Difficulty in walking, not elsewhere classified  Localized  edema     Problem List Patient Active Problem List   Diagnosis Date Noted  . Pre-diabetes 03/29/2016  . Cholelithiasis with chronic cholecystitis 02/04/2016  . Chronic cholecystitis with calculus 02/02/2016  . Esophagitis 09/03/2015  . Liver fibrosis 09/03/2015  . History of hepatitis C- cured 2017 07/22/2015  . B12 deficiency 11/07/2014  . Plantar fasciitis of right foot 08/13/2014  . Deformity of metatarsal  bone of right foot 08/13/2014  . Dyspnea 02/08/2013  . HSV antigen DIF positive 02/09/2011  . HIATAL HERNIA 07/13/2010  . NONSPEC ELEVATION OF LEVELS OF TRANSAMINASE/LDH 04/16/2010  . ESOPHAGEAL STRICTURE 02/05/2010  . RHINITIS 09/25/2008  . CHOLELITHIASIS 08/05/2008  . Essential hypertension 04/05/2007  . Nonspecific abnormal electrocardiogram (ECG) (EKG) 03/13/2007    Bess Harvest, PTA 01/10/17 2:59 PM  [facility] [address]  [place], [place], [address] Phone: [phone]   Fax:  [phone]  Name: Anna [name] MRN: [id number] Date of Birth: Nov 28, 1950

## 2017-01-11 MED FILL — MELOXICAM 7.5 MG TABLET: 7.5 | 30 days supply | Qty: 30 | Fill #0

## 2017-01-12 ENCOUNTER — Ambulatory Visit: Payer: BLUE CROSS/BLUE SHIELD | Admitting: Physical Therapy

## 2017-01-12 DIAGNOSIS — M25562 Pain in left knee: Secondary | ICD-10-CM | POA: Diagnosis not present

## 2017-01-12 DIAGNOSIS — R262 Difficulty in walking, not elsewhere classified: Secondary | ICD-10-CM

## 2017-01-12 DIAGNOSIS — R600 Localized edema: Secondary | ICD-10-CM

## 2017-01-12 DIAGNOSIS — M25662 Stiffness of left knee, not elsewhere classified: Secondary | ICD-10-CM

## 2017-01-12 NOTE — Therapy (Signed)
[facility] [address]  [place], [place], [address] Phone: [phone]   Fax:  [phone]  Physical Therapy Treatment  Patient Details  Name: Anna [name] MRN: [id number] Date of Birth: Jul 20, 1950 Referring Provider: Dr. Theda Sers   Encounter Date: 01/12/2017      PT End of Session - 01/12/17 0849    Visit Number 3   Number of Visits 12   Date for PT Re-Evaluation 02/16/17   PT Start Time 0849   PT Stop Time 0947   PT Time Calculation (min) 58 min   Activity Tolerance Patient tolerated treatment well   Behavior During Therapy [facility] for tasks assessed/performed      Past Medical History:  Diagnosis Date  . Arthritis    hands  . Atypical chest pain    Negative nuclear stress test  . Cholelithiasis    Single stone  . Cholelithiasis   . Dry eyes   . Dysrhythmia   . Esophageal stricture   . Family history of adverse reaction to anesthesia    pts sister had severe PONV  . Floaters    bilat   . GERD (gastroesophageal reflux disease)   . Hematuria, microscopic   . Hemorrhoids, internal    and external  . Hepatitis C 06/2015  . Hiatal hernia    EGD 07-2008  . Hypertension   . Numbness and tingling in hands    bilat   . Rhinitis    seasonal  . Tinnitus   . Vitamin D deficiency     Past Surgical History:  Procedure Laterality Date  . ABDOMINAL HYSTERECTOMY     fibroids &dysfunctional menes; USO  . APPENDECTOMY    . CHOLECYSTECTOMY N/A 02/04/2016   Procedure: LAPAROSCOPIC CHOLECYSTECTOMY ;  Surgeon: Armandina Gemma, MD;  Location: [facility];  Service: General;  Laterality: N/A;  . COLONOSCOPY  2007   Dr. Henrene Pastor, due 2017  . CT Angiogram  01/2006   Negative for PE  . G2 P1    . REFRACTIVE SURGERY    . UPPER GI ENDOSCOPY  07/2010   Esophagitis; distal esophageal ringlike structure    There were no vitals filed for this visit.      Subjective Assessment - 01/12/17 0851    Subjective Patient feeling  well - having some intermittent anterior knee pain. Saw Dr. Theda Sers yesterday concerning bruising. MD reporting normal skin changes and swelling.    Pertinent History HTN   Patient Stated Goals return normal function of knee   Currently in Pain? No/denies   Pain Score 0-No pain                         [facility] Adult PT Treatment/Exercise - 01/12/17 0853      Knee/Hip Exercises: Stretches   Quad Stretch Left;3 reps;30 seconds   Quad Stretch Limitations prone with strap; L LE in extension with towel roll placed under knee   Gastroc Stretch Left;3 reps;30 seconds   Gastroc Stretch Limitations prostretch     Knee/Hip Exercises: Aerobic   Recumbent Bike L2 x 6 minutes     Knee/Hip Exercises: Machines for Strengthening   Cybex Knee Extension 20# B LE x 15   Cybex Knee Flexion 20# B LE x 15     Knee/Hip Exercises: Standing   Step Down Left;15 reps;Hand Hold: 2;Step Height: 6"   Step Down Limitations some anterior knee pain   Functional Squat 15 reps  Functional Squat Limitations TRX   Wall Squat 15 reps;3 seconds     Knee/Hip Exercises: Seated   Long Arc Quad Left;15 reps   Long Arc Quad Weight 2 lbs.   Long [employer] Limitations add. ball squeeze - 3-5 sec hold     Knee/Hip Exercises: Supine   Bridges with [employer] 15 reps;Both  3-5 sec hold   Straight Leg Raises Strengthening;Left;15 reps   Straight Leg Raises Limitations 2#   Straight Leg Raise with External Rotation Strengthening;Left;15 reps   Straight Leg Raise with External Rotation Limitations 2#     Modalities   Modalities Vasopneumatic     Vasopneumatic   Number Minutes Vasopneumatic  15 minutes   Vasopnuematic Location  Knee   Vasopneumatic Pressure High   Vasopneumatic Temperature  Coldest Temp.                   PT Short Term Goals - 01/10/17 0945      PT SHORT TERM GOAL #1   Title Patient to be independent with initial HEP (01/19/17)   Status On-going     PT SHORT TERM GOAL  #2   Title Patient to improve L knee AROM equal to that of R knee (01/19/17)   Status On-going           PT Long Term Goals - 01/10/17 0945      PT LONG TERM GOAL #1   Title Patient to be independent with advanced HEP (02/16/17)   Status On-going     PT LONG TERM GOAL #2   Title Patient to improve L LE strength to >/= 4+/5 (02/16/17)   Status On-going     PT LONG TERM GOAL #3   Title Patient to demonstrate proper gait mechanics with good heel toe gait pattern and no evidence of instability (02/16/17)   Status On-going     PT LONG TERM GOAL #4   Title Patient to demonstrate ability to ascend/descend 15 steps reciprocally without evidence of instability (02/16/17)   Status On-going               Plan - 01/12/17 0853    Clinical Impression Statement Anna [name] progressing all strengthening activities well today. Much improved gait pattern since initial eval with ability to weight shift well and achieve full knee extension through stance phase of gait. Patient reporting some anterior knee pain with knee flexion, consistent with signs and symptoms of patellofemoral pain, likely due to general VMO/quad weakness.    PT Treatment/Interventions ADLs/Self Care Home Management;Cryotherapy;Electrical Stimulation;Iontophoresis 4mg /ml Dexamethasone;Moist Heat;Ultrasound;Neuromuscular re-education;Balance training;Therapeutic exercise;Therapeutic activities;Functional mobility training;Stair training;Gait training;Patient/family education;Manual techniques;Passive range of motion;Dry needling;Taping;Vasopneumatic Device   Consulted and Agree with Plan of Care Patient      Patient will benefit from skilled therapeutic intervention in order to improve the following deficits and impairments:  Abnormal gait, Decreased activity tolerance, Decreased balance, Decreased range of motion, Decreased mobility, Decreased strength, Difficulty walking, Pain, Increased edema  Visit Diagnosis: Acute pain of left  knee  Stiffness of left knee, not elsewhere classified  Difficulty in walking, not elsewhere classified  Localized edema     Problem List Patient Active Problem List   Diagnosis Date Noted  . Pre-diabetes 03/29/2016  . Cholelithiasis with chronic cholecystitis 02/04/2016  . Chronic cholecystitis with calculus 02/02/2016  . Esophagitis 09/03/2015  . Liver fibrosis 09/03/2015  . History of hepatitis C- cured 2017 07/22/2015  . B12 deficiency 11/07/2014  . Plantar fasciitis of right foot 08/13/2014  .  Deformity of metatarsal bone of right foot 08/13/2014  . Dyspnea 02/08/2013  . HSV antigen DIF positive 02/09/2011  . HIATAL HERNIA 07/13/2010  . NONSPEC ELEVATION OF LEVELS OF TRANSAMINASE/LDH 04/16/2010  . ESOPHAGEAL STRICTURE 02/05/2010  . RHINITIS 09/25/2008  . CHOLELITHIASIS 08/05/2008  . Essential hypertension 04/05/2007  . Nonspecific abnormal electrocardiogram (ECG) (EKG) 03/13/2007     Lanney Gins, PT, DPT 01/12/17 10:03 AM   [facility] [address]  [place], [place], [address] Phone: [phone]   Fax:  [phone]  Name: Anna [name] MRN: [id number] Date of Birth: 24-Mar-1951

## 2017-01-17 ENCOUNTER — Ambulatory Visit: Payer: BLUE CROSS/BLUE SHIELD | Attending: Specialist | Admitting: Physical Therapy

## 2017-01-17 DIAGNOSIS — R262 Difficulty in walking, not elsewhere classified: Secondary | ICD-10-CM

## 2017-01-17 DIAGNOSIS — R600 Localized edema: Secondary | ICD-10-CM

## 2017-01-17 DIAGNOSIS — M25662 Stiffness of left knee, not elsewhere classified: Secondary | ICD-10-CM | POA: Diagnosis present

## 2017-01-17 DIAGNOSIS — M25562 Pain in left knee: Secondary | ICD-10-CM

## 2017-01-17 NOTE — Therapy (Signed)
[facility] [address]  [place], [place], [address] Phone: [phone]   Fax:  [phone]  Physical Therapy Treatment  Patient Details  Name: Anna [name] MRN: [id number] Date of Birth: [birth year] Referring Provider: Dr. Theda Sers   Encounter Date: 01/17/2017      PT End of Session - 01/17/17 0935    Visit Number 4   Number of Visits 12   Date for PT Re-Evaluation 02/16/17   PT Start Time 0931   PT Stop Time 1033   PT Time Calculation (min) 62 min   Activity Tolerance Patient tolerated treatment well   Behavior During Therapy [facility] for tasks assessed/performed      Past Medical History:  Diagnosis Date  . Arthritis    hands  . Atypical chest pain    Negative nuclear stress test  . Cholelithiasis    Single stone  . Cholelithiasis   . Dry eyes   . Dysrhythmia   . Esophageal stricture   . Family history of adverse reaction to anesthesia    pts sister had severe PONV  . Floaters    bilat   . GERD (gastroesophageal reflux disease)   . Hematuria, microscopic   . Hemorrhoids, internal    and external  . Hepatitis C 06/2015  . Hiatal hernia    EGD 07-2008  . Hypertension   . Numbness and tingling in hands    bilat   . Rhinitis    seasonal  . Tinnitus   . Vitamin D deficiency     Past Surgical History:  Procedure Laterality Date  . ABDOMINAL HYSTERECTOMY     fibroids &dysfunctional menes; USO  . APPENDECTOMY    . CHOLECYSTECTOMY N/A 02/04/2016   Procedure: LAPAROSCOPIC CHOLECYSTECTOMY ;  Surgeon: Armandina Gemma, MD;  Location: [facility];  Service: General;  Laterality: N/A;  . COLONOSCOPY  2007   Dr. Henrene Pastor, due 2017  . CT Angiogram  01/2006   Negative for PE  . G2 P1    . REFRACTIVE SURGERY    . UPPER GI ENDOSCOPY  07/2010   Esophagitis; distal esophageal ringlike structure    There were no vitals filed for this visit.      Subjective Assessment - 01/17/17 0933    Subjective having some  soreness - hard to kneel down to floor; having some "cracking and popping" - talked to [employer] bout this - normal   Pertinent History HTN   Patient Stated Goals return normal function of knee   Currently in Pain? Yes   Pain Score 4    Pain Location Knee   Pain Orientation Left   Pain Descriptors / Indicators Aching;Tightness;Sore   Pain Type Surgical pain                         [facility] Adult PT Treatment/Exercise - 01/17/17 0936      Knee/Hip Exercises: Aerobic   Recumbent Bike L3 x 6 minutes     Knee/Hip Exercises: Machines for Strengthening   Cybex Knee Extension 20# B con/L ecc x 15   Cybex Knee Flexion 20# B con/L ecc x 15   Cybex Leg Press 25# B LE x 15; 25# B con/L ecc x 15      Knee/Hip Exercises: Standing   Step Down Left;15 reps;Hand Hold: 2;Step Height: 8"   Step Down Limitations eccentric   Functional Squat 15 reps   Functional Squat Limitations BOSU  SLS L SL on foam oval - 3 cone taps outside BOS x 10   Other Standing Knee Exercises side stepping - green tband 30 feet each direction   Other Standing Knee Exercises lateral weight shifting on BOSU x 10     Knee/Hip Exercises: Supine   Straight Leg Raises Strengthening;Left;15 reps   Straight Leg Raises Limitations 3#   Straight Leg Raise with External Rotation Strengthening;Left;15 reps   Straight Leg Raise with External Rotation Limitations 3#     Modalities   Modalities Vasopneumatic     Vasopneumatic   Number Minutes Vasopneumatic  15 minutes   Vasopnuematic Location  Knee   Vasopneumatic Pressure High   Vasopneumatic Temperature  Coldest Temp.                   PT Short Term Goals - 01/10/17 0945      PT SHORT TERM GOAL #1   Title Patient to be independent with initial HEP (01/19/17)   Status On-going     PT SHORT TERM GOAL #2   Title Patient to improve L knee AROM equal to that of R knee (01/19/17)   Status On-going           PT Long Term Goals - 01/10/17  0945      PT LONG TERM GOAL #1   Title Patient to be independent with advanced HEP (02/16/17)   Status On-going     PT LONG TERM GOAL #2   Title Patient to improve L LE strength to >/= 4+/5 (02/16/17)   Status On-going     PT LONG TERM GOAL #3   Title Patient to demonstrate proper gait mechanics with good heel toe gait pattern and no evidence of instability (02/16/17)   Status On-going     PT LONG TERM GOAL #4   Title Patient to demonstrate ability to ascend/descend 15 steps reciprocally without evidence of instability (02/16/17)   Status On-going               Plan - 01/17/17 0935    Clinical Impression Statement Patient continues to progress al atrengthening tasks well today with no issue. Some balance deficits noted with SL stance on compliant surfaces - will continue to work on this. Some patellar pain with eccenstric stranding activities today, however quickly resolves after completion of task. Patient to continue to progress towards goals.    PT Treatment/Interventions ADLs/Self Care Home Management;Cryotherapy;Electrical Stimulation;Iontophoresis 4mg /ml Dexamethasone;Moist Heat;Ultrasound;Neuromuscular re-education;Balance training;Therapeutic exercise;Therapeutic activities;Functional mobility training;Stair training;Gait training;Patient/family education;Manual techniques;Passive range of motion;Dry needling;Taping;Vasopneumatic Device   Consulted and Agree with Plan of Care Patient      Patient will benefit from skilled therapeutic intervention in order to improve the following deficits and impairments:  Abnormal gait, Decreased activity tolerance, Decreased balance, Decreased range of motion, Decreased mobility, Decreased strength, Difficulty walking, Pain, Increased edema  Visit Diagnosis: Acute pain of left knee  Stiffness of left knee, not elsewhere classified  Difficulty in walking, not elsewhere classified  Localized edema     Problem List Patient Active  Problem List   Diagnosis Date Noted  . Pre-diabetes 03/29/2016  . Cholelithiasis with chronic cholecystitis 02/04/2016  . Chronic cholecystitis with calculus 02/02/2016  . Esophagitis 09/03/2015  . Liver fibrosis 09/03/2015  . History of hepatitis C- cured 2017 07/22/2015  . B12 deficiency 11/07/2014  . Plantar fasciitis of right foot 08/13/2014  . Deformity of metatarsal bone of right foot 08/13/2014  . Dyspnea 02/08/2013  . HSV antigen DIF  positive 02/09/2011  . HIATAL HERNIA 07/13/2010  . NONSPEC ELEVATION OF LEVELS OF TRANSAMINASE/LDH 04/16/2010  . ESOPHAGEAL STRICTURE 02/05/2010  . RHINITIS 09/25/2008  . CHOLELITHIASIS 08/05/2008  . Essential hypertension 04/05/2007  . Nonspecific abnormal electrocardiogram (ECG) (EKG) 03/13/2007     Lanney Gins, PT, DPT 01/17/17 11:15 AM   [facility] [address]  [place], [place], [address] Phone: [phone]   Fax:  [phone]  Name: Anna [name] MRN: [id number] Date of Birth: Dec 07, 1950

## 2017-01-19 ENCOUNTER — Ambulatory Visit: Payer: BLUE CROSS/BLUE SHIELD | Admitting: Physical Therapy

## 2017-01-19 DIAGNOSIS — R600 Localized edema: Secondary | ICD-10-CM

## 2017-01-19 DIAGNOSIS — M25562 Pain in left knee: Secondary | ICD-10-CM | POA: Diagnosis not present

## 2017-01-19 DIAGNOSIS — R262 Difficulty in walking, not elsewhere classified: Secondary | ICD-10-CM

## 2017-01-19 DIAGNOSIS — M25662 Stiffness of left knee, not elsewhere classified: Secondary | ICD-10-CM

## 2017-01-19 NOTE — Therapy (Signed)
[facility] [address]  [place], [place], [address] Phone: [phone]   Fax:  [phone]  Physical Therapy Treatment  Patient Details  Name: Anna [name] MRN: [id number] Date of Birth: Nov 11, 1950 Referring Provider: Dr. Theda Sers   Encounter Date: 01/19/2017      PT End of Session - 01/19/17 0934    Visit Number 5   Number of Visits 12   Date for PT Re-Evaluation 02/16/17   PT Start Time 0932   PT Stop Time 1028   PT Time Calculation (min) 56 min   Activity Tolerance Patient tolerated treatment well   Behavior During Therapy [facility] for tasks assessed/performed      Past Medical History:  Diagnosis Date  . Arthritis    hands  . Atypical chest pain    Negative nuclear stress test  . Cholelithiasis    Single stone  . Cholelithiasis   . Dry eyes   . Dysrhythmia   . Esophageal stricture   . Family history of adverse reaction to anesthesia    pts sister had severe PONV  . Floaters    bilat   . GERD (gastroesophageal reflux disease)   . Hematuria, microscopic   . Hemorrhoids, internal    and external  . Hepatitis C 06/2015  . Hiatal hernia    EGD 07-2008  . Hypertension   . Numbness and tingling in hands    bilat   . Rhinitis    seasonal  . Tinnitus   . Vitamin D deficiency     Past Surgical History:  Procedure Laterality Date  . ABDOMINAL HYSTERECTOMY     fibroids &dysfunctional menes; USO  . APPENDECTOMY    . CHOLECYSTECTOMY N/A 02/04/2016   Procedure: LAPAROSCOPIC CHOLECYSTECTOMY ;  Surgeon: Armandina Gemma, MD;  Location: [facility];  Service: General;  Laterality: N/A;  . COLONOSCOPY  2007   Dr. Henrene Pastor, due 2017  . CT Angiogram  01/2006   Negative for PE  . G2 P1    . REFRACTIVE SURGERY    . UPPER GI ENDOSCOPY  07/2010   Esophagitis; distal esophageal ringlike structure    There were no vitals filed for this visit.      Subjective Assessment - 01/19/17 0933    Subjective Walked approx 1  mile yesterday - had some swelling after that resolved with ice    Pertinent History HTN   Patient Stated Goals return normal function of knee   Currently in Pain? Yes   Pain Score 3    Pain Location Knee   Pain Orientation Left   Pain Descriptors / Indicators Sore  "mild"   Pain Type Surgical pain            [facility] PT Assessment - 01/19/17 0001      AROM   Left Knee Extension -2   Left Knee Flexion 124                     [facility] Adult PT Treatment/Exercise - 01/19/17 0935      Knee/Hip Exercises: Stretches   Gastroc Stretch Left;3 reps;30 seconds   Gastroc Stretch Limitations prostretch     Knee/Hip Exercises: Aerobic   Recumbent Bike L3 x 6 minutes     Knee/Hip Exercises: Machines for Strengthening   Cybex Knee Extension 25# B con/L ecc x 10 - some anterior knee pain; reduced to 20# x 10    Cybex Knee Flexion 25# B con/L ecc  x 15     Knee/Hip Exercises: Standing   Forward Lunges Both;15 reps   Forward Lunges Limitations posterior LE on 9" stool   Step Down Left;15 reps;Hand Hold: 2;Step Height: 8"   Step Down Limitations eccentric   Other Standing Knee Exercises fitter - 2 blue - L hip extension x 15 reps     Knee/Hip Exercises: Seated   Other Seated Knee/Hip Exercises Fitter - 2 blue/1 black x 15     Knee/Hip Exercises: Supine   Single Leg Bridge Left;10 reps   Straight Leg Raises Strengthening;Left;15 reps   Straight Leg Raises Limitations 3#   Straight Leg Raise with External Rotation Strengthening;Left;15 reps   Straight Leg Raise with External Rotation Limitations 3#     Modalities   Modalities Vasopneumatic     Vasopneumatic   Number Minutes Vasopneumatic  15 minutes   Vasopnuematic Location  Knee   Vasopneumatic Pressure High   Vasopneumatic Temperature  Coldest Temp.                   PT Short Term Goals - 01/10/17 0945      PT SHORT TERM GOAL #1   Title Patient to be independent with initial HEP (01/19/17)   Status  On-going     PT SHORT TERM GOAL #2   Title Patient to improve L knee AROM equal to that of R knee (01/19/17)   Status On-going           PT Long Term Goals - 01/10/17 0945      PT LONG TERM GOAL #1   Title Patient to be independent with advanced HEP (02/16/17)   Status On-going     PT LONG TERM GOAL #2   Title Patient to improve L LE strength to >/= 4+/5 (02/16/17)   Status On-going     PT LONG TERM GOAL #3   Title Patient to demonstrate proper gait mechanics with good heel toe gait pattern and no evidence of instability (02/16/17)   Status On-going     PT LONG TERM GOAL #4   Title Patient to demonstrate ability to ascend/descend 15 steps reciprocally without evidence of instability (02/16/17)   Status On-going               Plan - 01/19/17 0934    Clinical Impression Statement Patient progressing well with strengthening and ROM. ROM near equal to opposite LE. Patient today with continued lsight patellar pain with L LE single leg eccentric work likely due to imbalance of strength/mobility at L knee - hopeful for quick resolution with improved VMO strength. Will continue to progress towards goals.    PT Treatment/Interventions ADLs/Self Care Home Management;Cryotherapy;Electrical Stimulation;Iontophoresis 4mg /ml Dexamethasone;Moist Heat;Ultrasound;Neuromuscular re-education;Balance training;Therapeutic exercise;Therapeutic activities;Functional mobility training;Stair training;Gait training;Patient/family education;Manual techniques;Passive range of motion;Dry needling;Taping;Vasopneumatic Device   Consulted and Agree with Plan of Care Patient      Patient will benefit from skilled therapeutic intervention in order to improve the following deficits and impairments:  Abnormal gait, Decreased activity tolerance, Decreased balance, Decreased range of motion, Decreased mobility, Decreased strength, Difficulty walking, Pain, Increased edema  Visit Diagnosis: Acute pain of left  knee  Stiffness of left knee, not elsewhere classified  Difficulty in walking, not elsewhere classified  Localized edema     Problem List Patient Active Problem List   Diagnosis Date Noted  . Pre-diabetes 03/29/2016  . Cholelithiasis with chronic cholecystitis 02/04/2016  . Chronic cholecystitis with calculus 02/02/2016  . Esophagitis 09/03/2015  . Liver  fibrosis 09/03/2015  . History of hepatitis C- cured 2017 07/22/2015  . B12 deficiency 11/07/2014  . Plantar fasciitis of right foot 08/13/2014  . Deformity of metatarsal bone of right foot 08/13/2014  . Dyspnea 02/08/2013  . HSV antigen DIF positive 02/09/2011  . HIATAL HERNIA 07/13/2010  . NONSPEC ELEVATION OF LEVELS OF TRANSAMINASE/LDH 04/16/2010  . ESOPHAGEAL STRICTURE 02/05/2010  . RHINITIS 09/25/2008  . CHOLELITHIASIS 08/05/2008  . Essential hypertension 04/05/2007  . Nonspecific abnormal electrocardiogram (ECG) (EKG) 03/13/2007     Lanney Gins, PT, DPT 01/19/17 10:44 AM   [facility] [address]  [place], [place], [address] Phone: [phone]   Fax:  [phone]  Name: Everlynn Sagun MRN: [id number] Date of Birth: 1951-06-28

## 2017-01-23 ENCOUNTER — Other Ambulatory Visit: Payer: Self-pay | Admitting: Family Medicine

## 2017-01-24 ENCOUNTER — Ambulatory Visit: Payer: BLUE CROSS/BLUE SHIELD | Admitting: Physical Therapy

## 2017-01-24 DIAGNOSIS — R600 Localized edema: Secondary | ICD-10-CM

## 2017-01-24 DIAGNOSIS — M25562 Pain in left knee: Secondary | ICD-10-CM | POA: Diagnosis not present

## 2017-01-24 DIAGNOSIS — M25662 Stiffness of left knee, not elsewhere classified: Secondary | ICD-10-CM

## 2017-01-24 DIAGNOSIS — R262 Difficulty in walking, not elsewhere classified: Secondary | ICD-10-CM

## 2017-01-24 NOTE — Therapy (Signed)
[facility] [address]  [place], [place], [address] Phone: [phone]   Fax:  [phone]  Physical Therapy Treatment  Patient Details  Name: Anna [name] MRN: [id number] Date of Birth: 11-14-1950 Referring Provider: Dr. Theda Sers   Encounter Date: 01/24/2017      PT End of Session - 01/24/17 1628    Visit Number 6   Number of Visits 12   Date for PT Re-Evaluation 02/16/17   PT Start Time 1624   PT Stop Time 1720   PT Time Calculation (min) 56 min   Activity Tolerance Patient tolerated treatment well   Behavior During Therapy [facility] for tasks assessed/performed      Past Medical History:  Diagnosis Date  . Arthritis    hands  . Atypical chest pain    Negative nuclear stress test  . Cholelithiasis    Single stone  . Cholelithiasis   . Dry eyes   . Dysrhythmia   . Esophageal stricture   . Family history of adverse reaction to anesthesia    pts sister had severe PONV  . Floaters    bilat   . GERD (gastroesophageal reflux disease)   . Hematuria, microscopic   . Hemorrhoids, internal    and external  . Hepatitis C 06/2015  . Hiatal hernia    EGD 07-2008  . Hypertension   . Numbness and tingling in hands    bilat   . Rhinitis    seasonal  . Tinnitus   . Vitamin D deficiency     Past Surgical History:  Procedure Laterality Date  . ABDOMINAL HYSTERECTOMY     fibroids &dysfunctional menes; USO  . APPENDECTOMY    . CHOLECYSTECTOMY N/A 02/04/2016   Procedure: LAPAROSCOPIC CHOLECYSTECTOMY ;  Surgeon: Armandina Gemma, MD;  Location: [facility];  Service: General;  Laterality: N/A;  . COLONOSCOPY  2007   Dr. Henrene Pastor, due 2017  . CT Angiogram  01/2006   Negative for PE  . G2 P1    . REFRACTIVE SURGERY    . UPPER GI ENDOSCOPY  07/2010   Esophagitis; distal esophageal ringlike structure    There were no vitals filed for this visit.      Subjective Assessment - 01/24/17 1627    Subjective Has had some  increase in knee pain - doesn't know if its from doing too much in PT or from car ride; returned to work today with difficulty leaving knee bent at desk   Pertinent History HTN   Patient Stated Goals return normal function of knee   Currently in Pain? Yes   Pain Score 5    Pain Location Knee   Pain Orientation Left   Pain Descriptors / Indicators Aching;Sore   Pain Type Surgical pain                         [facility] Adult PT Treatment/Exercise - 01/24/17 0001      Knee/Hip Exercises: Stretches   Passive Hamstring Stretch Left;3 reps;30 seconds   Passive Hamstring Stretch Limitations supine with strap   Other Knee/Hip Stretches L ITB stretch 3 x 30 sec     Knee/Hip Exercises: Aerobic   Recumbent Bike L1 x 6 minutes     Knee/Hip Exercises: Standing   Forward Lunges Both;15 reps   Forward Lunges Limitations TRX   Functional Squat 15 reps   Functional Squat Limitations TRX   Other Standing Knee Exercises side stepping -  green tband 30 feet each direction     Knee/Hip Exercises: Seated   Long Arc Quad Left;15 reps   Long Arc Quad Weight 4 lbs.   Long [employer] Limitations add. ball squeeze - 3-5 sec hold   Other Seated Knee/Hip Exercises Fitter - 2 blue/1 black x 15     Knee/Hip Exercises: Supine   Bridges with [employer] Both;15 reps   Straight Leg Raise with External Rotation Strengthening;Left;15 reps   Straight Leg Raise with External Rotation Limitations 4#     Modalities   Modalities Iontophoresis;Cryotherapy     Cryotherapy   Number Minutes Cryotherapy 15 Minutes   Cryotherapy Location Knee   Type of Cryotherapy Ice pack     Iontophoresis   Type of Iontophoresis Dexamethasone   Location L knee   Dose 1.0 mL   Time 80 mA; 4-6 hours                  PT Short Term Goals - 01/10/17 0945      PT SHORT TERM GOAL #1   Title Patient to be independent with initial HEP (01/19/17)   Status On-going     PT SHORT TERM GOAL #2   Title Patient  to improve L knee AROM equal to that of R knee (01/19/17)   Status On-going           PT Long Term Goals - 01/10/17 0945      PT LONG TERM GOAL #1   Title Patient to be independent with advanced HEP (02/16/17)   Status On-going     PT LONG TERM GOAL #2   Title Patient to improve L LE strength to >/= 4+/5 (02/16/17)   Status On-going     PT LONG TERM GOAL #3   Title Patient to demonstrate proper gait mechanics with good heel toe gait pattern and no evidence of instability (02/16/17)   Status On-going     PT LONG TERM GOAL #4   Title Patient to demonstrate ability to ascend/descend 15 steps reciprocally without evidence of instability (02/16/17)   Status On-going               Plan - 01/24/17 1628    Clinical Impression Statement Patient doing well today - some reports of anterior knee pain, wiht slight tenderness. Some swelling noted at anterior knee - with ionto applied today for hopeful pain reduction. PT session today focusing on body weight work as well as general quad strengthening with no pain increase during session.    PT Treatment/Interventions ADLs/Self Care Home Management;Cryotherapy;Electrical Stimulation;Iontophoresis 4mg /ml Dexamethasone;Moist Heat;Ultrasound;Neuromuscular re-education;Balance training;Therapeutic exercise;Therapeutic activities;Functional mobility training;Stair training;Gait training;Patient/family education;Manual techniques;Passive range of motion;Dry needling;Taping;Vasopneumatic Device   Consulted and Agree with Plan of Care Patient      Patient will benefit from skilled therapeutic intervention in order to improve the following deficits and impairments:  Abnormal gait, Decreased activity tolerance, Decreased balance, Decreased range of motion, Decreased mobility, Decreased strength, Difficulty walking, Pain, Increased edema  Visit Diagnosis: Acute pain of left knee  Stiffness of left knee, not elsewhere classified  Difficulty in walking,  not elsewhere classified  Localized edema     Problem List Patient Active Problem List   Diagnosis Date Noted  . Pre-diabetes 03/29/2016  . Cholelithiasis with chronic cholecystitis 02/04/2016  . Chronic cholecystitis with calculus 02/02/2016  . Esophagitis 09/03/2015  . Liver fibrosis 09/03/2015  . History of hepatitis C- cured 2017 07/22/2015  . B12 deficiency 11/07/2014  . Plantar  fasciitis of right foot 08/13/2014  . Deformity of metatarsal bone of right foot 08/13/2014  . Dyspnea 02/08/2013  . HSV antigen DIF positive 02/09/2011  . HIATAL HERNIA 07/13/2010  . NONSPEC ELEVATION OF LEVELS OF TRANSAMINASE/LDH 04/16/2010  . ESOPHAGEAL STRICTURE 02/05/2010  . RHINITIS 09/25/2008  . CHOLELITHIASIS 08/05/2008  . Essential hypertension 04/05/2007  . Nonspecific abnormal electrocardiogram (ECG) (EKG) 03/13/2007     Lanney Gins, PT, DPT 01/24/17 5:28 PM   [facility] [address]  [place], [place], [address] Phone: [phone]   Fax:  [phone]  Name: Deepa Barthel MRN: [id number] Date of Birth: May 18, 1951

## 2017-01-26 ENCOUNTER — Ambulatory Visit: Payer: BLUE CROSS/BLUE SHIELD | Admitting: Physical Therapy

## 2017-01-26 DIAGNOSIS — M25662 Stiffness of left knee, not elsewhere classified: Secondary | ICD-10-CM

## 2017-01-26 DIAGNOSIS — R262 Difficulty in walking, not elsewhere classified: Secondary | ICD-10-CM

## 2017-01-26 DIAGNOSIS — M25562 Pain in left knee: Secondary | ICD-10-CM

## 2017-01-26 DIAGNOSIS — R600 Localized edema: Secondary | ICD-10-CM

## 2017-01-26 NOTE — Therapy (Signed)
[facility] [address]  [place], [place], [address] Phone: [phone]   Fax:  [phone]  Physical Therapy Treatment  Patient Details  Name: Anna [name] MRN: [id number] Date of Birth: Jan 13, 1951 Referring Provider: Dr. Theda Sers   Encounter Date: 01/26/2017      PT End of Session - 01/26/17 1619    Visit Number 7   Number of Visits 12   Date for PT Re-Evaluation 02/16/17   PT Start Time 1616   PT Stop Time 1657   PT Time Calculation (min) 41 min   Activity Tolerance Patient tolerated treatment well   Behavior During Therapy [facility] for tasks assessed/performed      Past Medical History:  Diagnosis Date  . Arthritis    hands  . Atypical chest pain    Negative nuclear stress test  . Cholelithiasis    Single stone  . Cholelithiasis   . Dry eyes   . Dysrhythmia   . Esophageal stricture   . Family history of adverse reaction to anesthesia    pts sister had severe PONV  . Floaters    bilat   . GERD (gastroesophageal reflux disease)   . Hematuria, microscopic   . Hemorrhoids, internal    and external  . Hepatitis C 06/2015  . Hiatal hernia    EGD 07-2008  . Hypertension   . Numbness and tingling in hands    bilat   . Rhinitis    seasonal  . Tinnitus   . Vitamin D deficiency     Past Surgical History:  Procedure Laterality Date  . ABDOMINAL HYSTERECTOMY     fibroids &dysfunctional menes; USO  . APPENDECTOMY    . CHOLECYSTECTOMY N/A 02/04/2016   Procedure: LAPAROSCOPIC CHOLECYSTECTOMY ;  Surgeon: Armandina Gemma, MD;  Location: [facility];  Service: General;  Laterality: N/A;  . COLONOSCOPY  2007   Dr. Henrene Pastor, due 2017  . CT Angiogram  01/2006   Negative for PE  . G2 P1    . REFRACTIVE SURGERY    . UPPER GI ENDOSCOPY  07/2010   Esophagitis; distal esophageal ringlike structure    There were no vitals filed for this visit.      Subjective Assessment - 01/26/17 1618    Subjective Felt well after  ionto patch - but had pain next day while at work - doesn't know if she's doing too much at work   Patient Stated Goals return normal function of knee   Currently in Pain? Yes   Pain Score 5    Pain Location Knee   Pain Orientation Left   Pain Descriptors / Indicators Aching;Sore   Pain Type Surgical pain                         [facility] Adult PT Treatment/Exercise - 01/26/17 0001      Knee/Hip Exercises: Stretches   Other Knee/Hip Stretches foam roll to quad - 3 way - 1 min each     Knee/Hip Exercises: Aerobic   Recumbent Bike L2 x 6 minutes     Knee/Hip Exercises: Standing   Functional Squat 15 reps   Functional Squat Limitations TRX   Wall Squat 15 reps;3 seconds   Wall Squat Limitations with ball squeeze     Knee/Hip Exercises: Seated   Long Arc Quad Left;15 reps   Long Arc Quad Weight 4 lbs.   Long [employer] Limitations add. ball squeeze -  3-5 sec hold     Knee/Hip Exercises: Supine   Bridges with Diona Foley Squeeze Both;15 reps   Straight Leg Raises Strengthening;Left;15 reps   Straight Leg Raises Limitations 4#     Modalities   Modalities Iontophoresis     Iontophoresis   Type of Iontophoresis Dexamethasone   Location L knee   Dose 1.0 mL   Time 80 mA; 4-6 hours                  PT Short Term Goals - 01/10/17 0945      PT SHORT TERM GOAL #1   Title Patient to be independent with initial HEP (01/19/17)   Status On-going     PT SHORT TERM GOAL #2   Title Patient to improve L knee AROM equal to that of R knee (01/19/17)   Status On-going           PT Long Term Goals - 01/10/17 0945      PT LONG TERM GOAL #1   Title Patient to be independent with advanced HEP (02/16/17)   Status On-going     PT LONG TERM GOAL #2   Title Patient to improve L LE strength to >/= 4+/5 (02/16/17)   Status On-going     PT LONG TERM GOAL #3   Title Patient to demonstrate proper gait mechanics with good heel toe gait pattern and no evidence of instability  (02/16/17)   Status On-going     PT LONG TERM GOAL #4   Title Patient to demonstrate ability to ascend/descend 15 steps reciprocally without evidence of instability (02/16/17)   Status On-going               Plan - 01/26/17 1704    Clinical Impression Statement Anna [name] doing well today - some anterior knee pain, of which may be related to prolonge dpositioning at work as well as slight increase in swelling. No pain with VMO strengthening activities. Ionto continued today for hopeful continued pain relief.    PT Treatment/Interventions ADLs/Self Care Home Management;Cryotherapy;Electrical Stimulation;Iontophoresis 4mg /ml Dexamethasone;Moist Heat;Ultrasound;Neuromuscular re-education;Balance training;Therapeutic exercise;Therapeutic activities;Functional mobility training;Stair training;Gait training;Patient/family education;Manual techniques;Passive range of motion;Dry needling;Taping;Vasopneumatic Device   Consulted and Agree with Plan of Care Patient      Patient will benefit from skilled therapeutic intervention in order to improve the following deficits and impairments:  Abnormal gait, Decreased activity tolerance, Decreased balance, Decreased range of motion, Decreased mobility, Decreased strength, Difficulty walking, Pain, Increased edema  Visit Diagnosis: Acute pain of left knee  Stiffness of left knee, not elsewhere classified  Difficulty in walking, not elsewhere classified  Localized edema     Problem List Patient Active Problem List   Diagnosis Date Noted  . Pre-diabetes 03/29/2016  . Cholelithiasis with chronic cholecystitis 02/04/2016  . Chronic cholecystitis with calculus 02/02/2016  . Esophagitis 09/03/2015  . Liver fibrosis 09/03/2015  . History of hepatitis C- cured 2017 07/22/2015  . B12 deficiency 11/07/2014  . Plantar fasciitis of right foot 08/13/2014  . Deformity of metatarsal bone of right foot 08/13/2014  . Dyspnea 02/08/2013  . HSV antigen DIF  positive 02/09/2011  . HIATAL HERNIA 07/13/2010  . NONSPEC ELEVATION OF LEVELS OF TRANSAMINASE/LDH 04/16/2010  . ESOPHAGEAL STRICTURE 02/05/2010  . RHINITIS 09/25/2008  . CHOLELITHIASIS 08/05/2008  . Essential hypertension 04/05/2007  . Nonspecific abnormal electrocardiogram (ECG) (EKG) 03/13/2007     Lanney Gins, PT, DPT 01/26/17 5:06 PM   [facility] [address]  [place], [place], [address] Phone: [phone]   Fax:  [phone]  Name: Anna [name] MRN: [id number] Date of Birth: 10-23-1950

## 2017-01-31 ENCOUNTER — Ambulatory Visit: Payer: BLUE CROSS/BLUE SHIELD

## 2017-01-31 DIAGNOSIS — R600 Localized edema: Secondary | ICD-10-CM

## 2017-01-31 DIAGNOSIS — M25662 Stiffness of left knee, not elsewhere classified: Secondary | ICD-10-CM

## 2017-01-31 DIAGNOSIS — R262 Difficulty in walking, not elsewhere classified: Secondary | ICD-10-CM

## 2017-01-31 DIAGNOSIS — M25562 Pain in left knee: Secondary | ICD-10-CM | POA: Diagnosis not present

## 2017-01-31 NOTE — Therapy (Signed)
[facility] [address]  [place], [place], [address] Phone: [phone]   Fax:  [phone]  Physical Therapy Treatment  Patient Details  Name: Anna [name] MRN: [id number] Date of Birth: [birth year] Referring Provider: Dr. Theda Sers  Encounter Date: 01/31/2017      PT End of Session - 01/31/17 1620    Visit Number 8   Number of Visits 12   Date for PT Re-Evaluation 02/16/17   PT Start Time 1616   PT Stop Time 1702   PT Time Calculation (min) 46 min   Activity Tolerance Patient tolerated treatment well   Behavior During Therapy [facility] for tasks assessed/performed      Past Medical History:  Diagnosis Date  . Arthritis    hands  . Atypical chest pain    Negative nuclear stress test  . Cholelithiasis    Single stone  . Cholelithiasis   . Dry eyes   . Dysrhythmia   . Esophageal stricture   . Family history of adverse reaction to anesthesia    pts sister had severe PONV  . Floaters    bilat   . GERD (gastroesophageal reflux disease)   . Hematuria, microscopic   . Hemorrhoids, internal    and external  . Hepatitis C 06/2015  . Hiatal hernia    EGD 07-2008  . Hypertension   . Numbness and tingling in hands    bilat   . Rhinitis    seasonal  . Tinnitus   . Vitamin D deficiency     Past Surgical History:  Procedure Laterality Date  . ABDOMINAL HYSTERECTOMY     fibroids &dysfunctional menes; USO  . APPENDECTOMY    . CHOLECYSTECTOMY N/A 02/04/2016   Procedure: LAPAROSCOPIC CHOLECYSTECTOMY ;  Surgeon: Armandina Gemma, MD;  Location: [facility];  Service: General;  Laterality: N/A;  . COLONOSCOPY  2007   Dr. Henrene Pastor, due 2017  . CT Angiogram  01/2006   Negative for PE  . G2 P1    . REFRACTIVE SURGERY    . UPPER GI ENDOSCOPY  07/2010   Esophagitis; distal esophageal ringlike structure    There were no vitals filed for this visit.      Subjective Assessment - 01/31/17 1618    Subjective Pt. noting  limited benefit from ionto patch last treatment.  Pt. noting some pain in "front of knees" while ascending garage step.     Patient Stated Goals return normal function of knee   Currently in Pain? No/denies   Pain Score 0-No pain   Multiple Pain Sites No            [facility] PT Assessment - 01/31/17 1644      Assessment   Referring Provider Dr. Theda Sers   Next MD Visit 7.18.18                     [facility] Adult PT Treatment/Exercise - 01/31/17 1644      Ambulation/Gait   Ambulation/Gait Yes   Ambulation/Gait Assistance 6: Modified independent (Device/Increase time)   Ambulation Distance (Feet) 180 Feet   Assistive device None   Gait Pattern Decreased hip/knee flexion - left   Ambulation Surface Level;Indoor   Stairs Yes   Stairs Assistance 6: Modified independent (Device/Increase time)   Stair Management Technique Alternating pattern;One rail Right   Number of Stairs 15   Height of Stairs 8   Gait Comments slight antalgic gait and decreased wt. shift over L  LE; some limited L quad control with descending and L knee pain      Knee/Hip Exercises: Aerobic   Recumbent Bike L2 x 6 minutes     Knee/Hip Exercises: Standing   Functional Squat 15 reps   Functional Squat Limitations TRX + heels raise at bottom of squat    SLS L SLS on foam oval 2 x 45 sec; no UE support     Knee/Hip Exercises: Supine   Single Leg Bridge Right;Left;15 reps  with opposite LE SLR   Straight Leg Raises Strengthening;Left;20 reps   Straight Leg Raises Limitations 4#                PT Education - 01/31/17 1705    Education provided Yes   Education Details single leg bridge    Person(s) Educated Patient   Methods Explanation;Demonstration;Verbal cues;Handout   Comprehension Verbalized understanding;Returned demonstration;Verbal cues required;Need further instruction          PT Short Term Goals - 01/31/17 1700      PT SHORT TERM GOAL #1   Title Patient to be independent  with initial HEP (01/19/17)   Status Achieved     PT SHORT TERM GOAL #2   Title Patient to improve L knee AROM equal to that of R knee (01/19/17)   Status On-going           PT Long Term Goals - 01/31/17 1629      PT LONG TERM GOAL #1   Title Patient to be independent with advanced HEP (02/16/17)   Status On-going     PT LONG TERM GOAL #2   Title Patient to improve L LE strength to >/= 4+/5 (02/16/17)   Status Partially Met  7.17.18: met for all except L knee extension and L hip extension 4/5      PT LONG TERM GOAL #3   Title Patient to demonstrate proper gait mechanics with good heel toe gait pattern and no evidence of instability (02/16/17)   Status On-going  7.17.18: Pt. with slight antalgic gait      PT LONG TERM GOAL #4   Title Patient to demonstrate ability to ascend/descend 15 steps reciprocally without evidence of instability (02/16/17)   Status On-going  7.17.18: pt. able to ascend/descend reciprocally however instable on descending and L knee pain.               Plan - 01/31/17 1644    Clinical Impression Statement Pt. doing well today however noting less relief with application of iontophoresis patch last session thus ionto deferred today.  Some anterior knee pain still with stairs at this point.  Some instability at L quad and L knee pain with descending stairs in treatment.  Pt. partially meeting strength goal today with remaining weakness in L hip extension and L knee extension still 4/5 strength.  HEP updated with strengthening activities for these deficits.  L knee AROM nearly symmetrical to R however still lacking 5 dg flexion.  Seems to be progressing well with therapy.     PT Treatment/Interventions ADLs/Self Care Home Management;Cryotherapy;Electrical Stimulation;Iontophoresis '4mg'$ /ml Dexamethasone;Moist Heat;Ultrasound;Neuromuscular re-education;Balance training;Therapeutic exercise;Therapeutic activities;Functional mobility training;Stair training;Gait  training;Patient/family education;Manual techniques;Passive range of motion;Dry needling;Taping;Vasopneumatic Device      Patient will benefit from skilled therapeutic intervention in order to improve the following deficits and impairments:  Abnormal gait, Decreased activity tolerance, Decreased balance, Decreased range of motion, Decreased mobility, Decreased strength, Difficulty walking, Pain, Increased edema  Visit Diagnosis: Acute pain of left knee  Stiffness of left knee, not elsewhere classified  Difficulty in walking, not elsewhere classified  Localized edema     Problem List Patient Active Problem List   Diagnosis Date Noted  . Pre-diabetes 03/29/2016  . Cholelithiasis with chronic cholecystitis 02/04/2016  . Chronic cholecystitis with calculus 02/02/2016  . Esophagitis 09/03/2015  . Liver fibrosis 09/03/2015  . History of hepatitis C- cured 2017 07/22/2015  . B12 deficiency 11/07/2014  . Plantar fasciitis of right foot 08/13/2014  . Deformity of metatarsal bone of right foot 08/13/2014  . Dyspnea 02/08/2013  . HSV antigen DIF positive 02/09/2011  . HIATAL HERNIA 07/13/2010  . NONSPEC ELEVATION OF LEVELS OF TRANSAMINASE/LDH 04/16/2010  . ESOPHAGEAL STRICTURE 02/05/2010  . RHINITIS 09/25/2008  . CHOLELITHIASIS 08/05/2008  . Essential hypertension 04/05/2007  . Nonspecific abnormal electrocardiogram (ECG) (EKG) 03/13/2007    Bess Harvest, PTA 01/31/17 6:20 PM  [facility] [address]  [place], [place], [address] Phone: [phone]   Fax:  [phone]  Name: Anna [name] MRN: [id number] Date of Birth: 09-May-1951

## 2017-02-02 ENCOUNTER — Ambulatory Visit: Payer: BLUE CROSS/BLUE SHIELD | Admitting: Physical Therapy

## 2017-02-02 DIAGNOSIS — M25562 Pain in left knee: Secondary | ICD-10-CM

## 2017-02-02 DIAGNOSIS — M25662 Stiffness of left knee, not elsewhere classified: Secondary | ICD-10-CM

## 2017-02-02 DIAGNOSIS — R262 Difficulty in walking, not elsewhere classified: Secondary | ICD-10-CM

## 2017-02-02 DIAGNOSIS — R600 Localized edema: Secondary | ICD-10-CM

## 2017-02-02 NOTE — Therapy (Signed)
[facility] [address]  [place], [place], [address] Phone: [phone]   Fax:  [phone]  Physical Therapy Treatment  Patient Details  Name: Anna [name] MRN: [id number] Date of Birth: 24-Jul-1950 Referring Provider: Dr. Theda Sers  Encounter Date: 02/02/2017      PT End of Session - 02/02/17 1617    Visit Number 9   Number of Visits 12   Date for PT Re-Evaluation 02/16/17   PT Start Time 1615   PT Stop Time 1657   PT Time Calculation (min) 42 min   Activity Tolerance Patient tolerated treatment well   Behavior During Therapy [facility] for tasks assessed/performed      Past Medical History:  Diagnosis Date  . Arthritis    hands  . Atypical chest pain    Negative nuclear stress test  . Cholelithiasis    Single stone  . Cholelithiasis   . Dry eyes   . Dysrhythmia   . Esophageal stricture   . Family history of adverse reaction to anesthesia    pts sister had severe PONV  . Floaters    bilat   . GERD (gastroesophageal reflux disease)   . Hematuria, microscopic   . Hemorrhoids, internal    and external  . Hepatitis C 06/2015  . Hiatal hernia    EGD 07-2008  . Hypertension   . Numbness and tingling in hands    bilat   . Rhinitis    seasonal  . Tinnitus   . Vitamin D deficiency     Past Surgical History:  Procedure Laterality Date  . ABDOMINAL HYSTERECTOMY     fibroids &dysfunctional menes; USO  . APPENDECTOMY    . CHOLECYSTECTOMY N/A 02/04/2016   Procedure: LAPAROSCOPIC CHOLECYSTECTOMY ;  Surgeon: Armandina Gemma, MD;  Location: [facility];  Service: General;  Laterality: N/A;  . COLONOSCOPY  2007   Dr. Henrene Pastor, due 2017  . CT Angiogram  01/2006   Negative for PE  . G2 P1    . REFRACTIVE SURGERY    . UPPER GI ENDOSCOPY  07/2010   Esophagitis; distal esophageal ringlike structure    There were no vitals filed for this visit.      Subjective Assessment - 02/02/17 1614    Subjective Saw PA yesterday  - feels like anterior knee pain is from tightness and swelling - prescribed Voltaren gel   Pertinent History HTN   Patient Stated Goals return normal function of knee   Currently in Pain? No/denies   Pain Score 0-No pain                         [facility] Adult PT Treatment/Exercise - 02/02/17 1618      Knee/Hip Exercises: Aerobic   Recumbent Bike L3 x 6 minutes     Knee/Hip Exercises: Machines for Strengthening   Cybex Leg Press 25# B LE x 15; 25# B con/L ecc x 15      Knee/Hip Exercises: Standing   Functional Squat 15 reps;2 sets   Functional Squat Limitations TRX; 2nd set with heel raise   SLS L SL on BOSU 3 x 30 sec   Other Standing Knee Exercises L SL bend and reach x 15 reps      Knee/Hip Exercises: Seated   Long Arc Quad Left;15 reps   Long Arc Quad Weight 5 lbs.   Long [employer] Limitations add. ball squeeze - 3-5 sec hold  Knee/Hip Exercises: Supine   Other Supine Knee/Hip Exercises straight leg bridge + HS curl x 10 reps     Knee/Hip Exercises: Prone   Hamstring Curl 15 reps   Hamstring Curl Limitations 5#   Hip Extension Left;15 reps   Hip Extension Limitations 2#                  PT Short Term Goals - 01/31/17 1700      PT SHORT TERM GOAL #1   Title Patient to be independent with initial HEP (01/19/17)   Status Achieved     PT SHORT TERM GOAL #2   Title Patient to improve L knee AROM equal to that of R knee (01/19/17)   Status On-going           PT Long Term Goals - 01/31/17 1629      PT LONG TERM GOAL #1   Title Patient to be independent with advanced HEP (02/16/17)   Status On-going     PT LONG TERM GOAL #2   Title Patient to improve L LE strength to >/= 4+/5 (02/16/17)   Status Partially Met  7.17.18: met for all except L knee extension and L hip extension 4/5      PT LONG TERM GOAL #3   Title Patient to demonstrate proper gait mechanics with good heel toe gait pattern and no evidence of instability (02/16/17)   Status  On-going  7.17.18: Pt. with slight antalgic gait      PT LONG TERM GOAL #4   Title Patient to demonstrate ability to ascend/descend 15 steps reciprocally without evidence of instability (02/16/17)   Status On-going  7.17.18: pt. able to ascend/descend reciprocally however instable on descending and L knee pain.               Plan - 02/02/17 1617    Clinical Impression Statement Patient seen by PA yesterday with prescription of Voltaren gel for hopeful pain relief at L anterior knee. Patient reporitng PA believes anterior kne pain may be a result of some continued swelling and tightness. Patient doing well with all strengthening today with only slight pain produced during L SL bend and reach - improved with weight shiftto posterior foot, rather than toes. Will continue to progress towards goals.    PT Treatment/Interventions ADLs/Self Care Home Management;Cryotherapy;Electrical Stimulation;Iontophoresis 14m/ml Dexamethasone;Moist Heat;Ultrasound;Neuromuscular re-education;Balance training;Therapeutic exercise;Therapeutic activities;Functional mobility training;Stair training;Gait training;Patient/family education;Manual techniques;Passive range of motion;Dry needling;Taping;Vasopneumatic Device   Consulted and Agree with Plan of Care Patient      Patient will benefit from skilled therapeutic intervention in order to improve the following deficits and impairments:  Abnormal gait, Decreased activity tolerance, Decreased balance, Decreased range of motion, Decreased mobility, Decreased strength, Difficulty walking, Pain, Increased edema  Visit Diagnosis: Acute pain of left knee  Stiffness of left knee, not elsewhere classified  Difficulty in walking, not elsewhere classified  Localized edema     Problem List Patient Active Problem List   Diagnosis Date Noted  . Pre-diabetes 03/29/2016  . Cholelithiasis with chronic cholecystitis 02/04/2016  . Chronic cholecystitis with calculus  02/02/2016  . Esophagitis 09/03/2015  . Liver fibrosis 09/03/2015  . History of hepatitis C- cured 2017 07/22/2015  . B12 deficiency 11/07/2014  . Plantar fasciitis of right foot 08/13/2014  . Deformity of metatarsal bone of right foot 08/13/2014  . Dyspnea 02/08/2013  . HSV antigen DIF positive 02/09/2011  . HIATAL HERNIA 07/13/2010  . NONSPEC ELEVATION OF LEVELS OF TRANSAMINASE/LDH 04/16/2010  .  ESOPHAGEAL STRICTURE 02/05/2010  . RHINITIS 09/25/2008  . CHOLELITHIASIS 08/05/2008  . Essential hypertension 04/05/2007  . Nonspecific abnormal electrocardiogram (ECG) (EKG) 03/13/2007    Lanney Gins, PT, DPT 02/02/17 4:58 PM   [facility] [address]  [place], [place], [address] Phone: [phone]   Fax:  [phone]  Name: Anna [name] MRN: [id number] Date of Birth: [birth year]

## 2017-02-07 ENCOUNTER — Ambulatory Visit: Payer: BLUE CROSS/BLUE SHIELD

## 2017-02-07 DIAGNOSIS — R600 Localized edema: Secondary | ICD-10-CM

## 2017-02-07 DIAGNOSIS — R262 Difficulty in walking, not elsewhere classified: Secondary | ICD-10-CM

## 2017-02-07 DIAGNOSIS — M25662 Stiffness of left knee, not elsewhere classified: Secondary | ICD-10-CM

## 2017-02-07 DIAGNOSIS — M25562 Pain in left knee: Secondary | ICD-10-CM

## 2017-02-07 NOTE — Therapy (Signed)
[facility] [address]  [place], [place], [address] Phone: [phone]   Fax:  [phone]  Physical Therapy Treatment  Patient Details  Name: Rosann Gorum MRN: [id number] Date of Birth: [birth year] Referring Provider: Dr. Theda Sers  Encounter Date: 02/07/2017      PT End of Session - 02/07/17 1710    Visit Number 10   Number of Visits 12   Date for PT Re-Evaluation 02/16/17   PT Start Time 6789  pt. arrived late    PT Stop Time 1745   PT Time Calculation (min) 40 min   Activity Tolerance Patient tolerated treatment well   Behavior During Therapy WFL for tasks assessed/performed      Past Medical History:  Diagnosis Date  . Arthritis    hands  . Atypical chest pain    Negative nuclear stress test  . Cholelithiasis    Single stone  . Cholelithiasis   . Dry eyes   . Dysrhythmia   . Esophageal stricture   . Family history of adverse reaction to anesthesia    pts sister had severe PONV  . Floaters    bilat   . GERD (gastroesophageal reflux disease)   . Hematuria, microscopic   . Hemorrhoids, internal    and external  . Hepatitis C 06/2015  . Hiatal hernia    EGD 07-2008  . Hypertension   . Numbness and tingling in hands    bilat   . Rhinitis    seasonal  . Tinnitus   . Vitamin D deficiency     Past Surgical History:  Procedure Laterality Date  . ABDOMINAL HYSTERECTOMY     fibroids &dysfunctional menes; USO  . APPENDECTOMY    . CHOLECYSTECTOMY N/A 02/04/2016   Procedure: LAPAROSCOPIC CHOLECYSTECTOMY ;  Surgeon: Armandina Gemma, MD;  Location: [facility];  Service: General;  Laterality: N/A;  . COLONOSCOPY  2007   Dr. Henrene Pastor, due 2017  . CT Angiogram  01/2006   Negative for PE  . G2 P1    . REFRACTIVE SURGERY    . UPPER GI ENDOSCOPY  07/2010   Esophagitis; distal esophageal ringlike structure    There were no vitals filed for this visit.      Subjective Assessment - 02/07/17 1708    Subjective Pt. doing well today.  Notes continued stiffness and mild swelling at L knee.     Patient Stated Goals return normal function of knee   Currently in Pain? Yes   Pain Score 5    Pain Location Knee   Pain Orientation Left;Anterior   Pain Descriptors / Indicators Aching   Pain Type Surgical pain   Pain Frequency Intermittent   Multiple Pain Sites No            [facility] PT Assessment - 02/07/17 1718      AROM   AROM Assessment Site Knee   Right/Left Knee Right;Left   Right Knee Extension -3   Right Knee Flexion 130   Left Knee Extension -2   Left Knee Flexion 127                     [facility] Adult PT Treatment/Exercise - 02/07/17 1719      Exercises   Exercises Lumbar     Lumbar Exercises: Quadruped   Straight Leg Raise 3 seconds;15 reps   Straight Leg Raises Limitations 4#      Knee/Hip Exercises: Aerobic   Recumbent Bike L3  x 6 minutes     Knee/Hip Exercises: Standing   Heel Raises 20 reps;3 seconds   Heel Raises Limitations B con/L ecc   Forward Lunges Right;Left;15 reps;3 seconds   Forward Lunges Limitations holding onto machine    Step Down Left;15 reps;Step Height: 8";Hand Hold: 1   Other Standing Knee Exercises Backward/forward walking with green TB around ankles x 60 ft each way     Knee/Hip Exercises: Supine   Single Leg Bridge Right;Left;15 reps                  PT Short Term Goals - 02/07/17 1722      PT SHORT TERM GOAL #1   Title Patient to be independent with initial HEP (01/19/17)   Status Achieved     PT SHORT TERM GOAL #2   Title Patient to improve L knee AROM equal to that of R knee (01/19/17)   Status Achieved           PT Long Term Goals - 02/07/17 1738      PT LONG TERM GOAL #1   Title Patient to be independent with advanced HEP (02/16/17)   Status Partially Met  met for current      PT LONG TERM GOAL #2   Title Patient to improve L LE strength to >/= 4+/5 (02/16/17)   Status Partially Met  7.17.18: met  for all except L knee extension and L hip extension 4/5      PT LONG TERM GOAL #3   Title Patient to demonstrate proper gait mechanics with good heel toe gait pattern and no evidence of instability (02/16/17)   Status Achieved     PT LONG TERM GOAL #4   Title Patient to demonstrate ability to ascend/descend 15 steps reciprocally without evidence of instability (02/16/17)   Status On-going  7.17.18: pt. able to ascend/descend reciprocally however instable on descending and L knee pain.               Plan - 02/07/17 1711    Clinical Impression Statement Izumi doing well today and making good progress toward goals.  Able to meet AROM goal today with R and L knee AROM nearly symmetrical.  Seems to be improving with L quad eccentric control with step downs.  Strengthening activities focused on remaining deficits with pt. on track to meet remaining goals.     PT Treatment/Interventions ADLs/Self Care Home Management;Cryotherapy;Electrical Stimulation;Iontophoresis '4mg'$ /ml Dexamethasone;Moist Heat;Ultrasound;Neuromuscular re-education;Balance training;Therapeutic exercise;Therapeutic activities;Functional mobility training;Stair training;Gait training;Patient/family education;Manual techniques;Passive range of motion;Dry needling;Taping;Vasopneumatic Device      Patient will benefit from skilled therapeutic intervention in order to improve the following deficits and impairments:  Abnormal gait, Decreased activity tolerance, Decreased balance, Decreased range of motion, Decreased mobility, Decreased strength, Difficulty walking, Pain, Increased edema  Visit Diagnosis: Acute pain of left knee  Stiffness of left knee, not elsewhere classified  Difficulty in walking, not elsewhere classified  Localized edema     Problem List Patient Active Problem List   Diagnosis Date Noted  . Pre-diabetes 03/29/2016  . Cholelithiasis with chronic cholecystitis 02/04/2016  . Chronic cholecystitis with  calculus 02/02/2016  . Esophagitis 09/03/2015  . Liver fibrosis 09/03/2015  . History of hepatitis C- cured 2017 07/22/2015  . B12 deficiency 11/07/2014  . Plantar fasciitis of right foot 08/13/2014  . Deformity of metatarsal bone of right foot 08/13/2014  . Dyspnea 02/08/2013  . HSV antigen DIF positive 02/09/2011  . HIATAL HERNIA 07/13/2010  . NONSPEC ELEVATION  OF LEVELS OF TRANSAMINASE/LDH 04/16/2010  . ESOPHAGEAL STRICTURE 02/05/2010  . RHINITIS 09/25/2008  . CHOLELITHIASIS 08/05/2008  . Essential hypertension 04/05/2007  . Nonspecific abnormal electrocardiogram (ECG) (EKG) 03/13/2007    Bess Harvest, PTA 02/07/17 6:00 PM  [facility] [address]  [place], [place], [address] Phone: [phone]   Fax:  [phone]  Name: Janita Camberos MRN: [id number] Date of Birth: [birth year]

## 2017-02-14 ENCOUNTER — Ambulatory Visit: Payer: BLUE CROSS/BLUE SHIELD | Admitting: Physical Therapy

## 2017-02-14 DIAGNOSIS — R262 Difficulty in walking, not elsewhere classified: Secondary | ICD-10-CM

## 2017-02-14 DIAGNOSIS — M25662 Stiffness of left knee, not elsewhere classified: Secondary | ICD-10-CM

## 2017-02-14 DIAGNOSIS — M25562 Pain in left knee: Secondary | ICD-10-CM | POA: Diagnosis not present

## 2017-02-14 DIAGNOSIS — R600 Localized edema: Secondary | ICD-10-CM

## 2017-02-14 NOTE — Therapy (Signed)
[facility] [address]  [place], [place], [address] Phone: [phone]   Fax:  [phone]  Physical Therapy Treatment  Patient Details  Name: Anna [name] MRN: [id number] Date of Birth: [birth year] Referring Provider: Dr. Theda Sers  Encounter Date: 02/14/2017      PT End of Session - 02/14/17 1621    Visit Number 11   Number of Visits 12   Date for PT Re-Evaluation 02/16/17   PT Start Time 8182   PT Stop Time 1658   PT Time Calculation (min) 41 min   Activity Tolerance Patient tolerated treatment well   Behavior During Therapy [facility] for tasks assessed/performed      Past Medical History:  Diagnosis Date  . Arthritis    hands  . Atypical chest pain    Negative nuclear stress test  . Cholelithiasis    Single stone  . Cholelithiasis   . Dry eyes   . Dysrhythmia   . Esophageal stricture   . Family history of adverse reaction to anesthesia    pts sister had severe PONV  . Floaters    bilat   . GERD (gastroesophageal reflux disease)   . Hematuria, microscopic   . Hemorrhoids, internal    and external  . Hepatitis C 06/2015  . Hiatal hernia    EGD 07-2008  . Hypertension   . Numbness and tingling in hands    bilat   . Rhinitis    seasonal  . Tinnitus   . Vitamin D deficiency     Past Surgical History:  Procedure Laterality Date  . ABDOMINAL HYSTERECTOMY     fibroids &dysfunctional menes; USO  . APPENDECTOMY    . CHOLECYSTECTOMY N/A 02/04/2016   Procedure: LAPAROSCOPIC CHOLECYSTECTOMY ;  Surgeon: Armandina Gemma, MD;  Location: [facility];  Service: General;  Laterality: N/A;  . COLONOSCOPY  2007   Dr. Henrene Pastor, due 2017  . CT Angiogram  01/2006   Negative for PE  . G2 P1    . REFRACTIVE SURGERY    . UPPER GI ENDOSCOPY  07/2010   Esophagitis; distal esophageal ringlike structure    There were no vitals filed for this visit.      Subjective Assessment - 02/14/17 1619    Subjective only has  sporadic pain, able to kneel without pain   Patient Stated Goals return normal function of knee   Currently in Pain? Yes   Pain Score 5    Pain Location Knee   Pain Orientation Left   Pain Descriptors / Indicators Sore   Pain Type Surgical pain                         [facility] Adult PT Treatment/Exercise - 02/14/17 1622      Knee/Hip Exercises: Stretches   Other Knee/Hip Stretches foam roll to quad - 3 way - 1 min each     Knee/Hip Exercises: Aerobic   Elliptical L3 x 5 min     Knee/Hip Exercises: Machines for Strengthening   Cybex Leg Press 35# B LE x 15; 35# B con/L ecc x 15     Knee/Hip Exercises: Standing   Functional Squat 15 reps   Functional Squat Limitations TRX + heel raise   Wall Squat 15 reps;10 seconds   Wall Squat Limitations with ball squeeze   Other Standing Knee Exercises Warm-up: quad pull + heel raise, frankenstein, lunge + reach   Other Standing Knee  Exercises side stepping 30 feet each direction - green tband - hip and knee flexed     Knee/Hip Exercises: Supine   Bridges with Diona Foley Squeeze Both;15 reps   Straight Leg Raise with External Rotation Strengthening;Left;15 reps   Straight Leg Raise with External Rotation Limitations 4#                  PT Short Term Goals - 02/07/17 1722      PT SHORT TERM GOAL #1   Title Patient to be independent with initial HEP (01/19/17)   Status Achieved     PT SHORT TERM GOAL #2   Title Patient to improve L knee AROM equal to that of R knee (01/19/17)   Status Achieved           PT Long Term Goals - 02/07/17 1738      PT LONG TERM GOAL #1   Title Patient to be independent with advanced HEP (02/16/17)   Status Partially Met  met for current      PT LONG TERM GOAL #2   Title Patient to improve L LE strength to >/= 4+/5 (02/16/17)   Status Partially Met  7.17.18: met for all except L knee extension and L hip extension 4/5      PT LONG TERM GOAL #3   Title Patient to demonstrate proper  gait mechanics with good heel toe gait pattern and no evidence of instability (02/16/17)   Status Achieved     PT LONG TERM GOAL #4   Title Patient to demonstrate ability to ascend/descend 15 steps reciprocally without evidence of instability (02/16/17)   Status On-going  7.17.18: pt. able to ascend/descend reciprocally however instable on descending and L knee pain.               Plan - 02/14/17 1621    Clinical Impression Statement Patient doing well with all strengthning and warm-up based activities to prepare patient to return to gym program. Elliptical today with no issues. patient reporting she has been able to start wearing heels (boots) with little issue. Will plan to see patient tomorrow to complete POC and review comprehenisive HEP for return to gym.    PT Treatment/Interventions ADLs/Self Care Home Management;Cryotherapy;Electrical Stimulation;Iontophoresis '4mg'$ /ml Dexamethasone;Moist Heat;Ultrasound;Neuromuscular re-education;Balance training;Therapeutic exercise;Therapeutic activities;Functional mobility training;Stair training;Gait training;Patient/family education;Manual techniques;Passive range of motion;Dry needling;Taping;Vasopneumatic Device   Consulted and Agree with Plan of Care Patient      Patient will benefit from skilled therapeutic intervention in order to improve the following deficits and impairments:  Abnormal gait, Decreased activity tolerance, Decreased balance, Decreased range of motion, Decreased mobility, Decreased strength, Difficulty walking, Pain, Increased edema  Visit Diagnosis: Acute pain of left knee  Stiffness of left knee, not elsewhere classified  Difficulty in walking, not elsewhere classified  Localized edema     Problem List Patient Active Problem List   Diagnosis Date Noted  . Pre-diabetes 03/29/2016  . Cholelithiasis with chronic cholecystitis 02/04/2016  . Chronic cholecystitis with calculus 02/02/2016  . Esophagitis 09/03/2015   . Liver fibrosis 09/03/2015  . History of hepatitis C- cured 2017 07/22/2015  . B12 deficiency 11/07/2014  . Plantar fasciitis of right foot 08/13/2014  . Deformity of metatarsal bone of right foot 08/13/2014  . Dyspnea 02/08/2013  . HSV antigen DIF positive 02/09/2011  . HIATAL HERNIA 07/13/2010  . NONSPEC ELEVATION OF LEVELS OF TRANSAMINASE/LDH 04/16/2010  . ESOPHAGEAL STRICTURE 02/05/2010  . RHINITIS 09/25/2008  . CHOLELITHIASIS 08/05/2008  . Essential hypertension 04/05/2007  .  Nonspecific abnormal electrocardiogram (ECG) (EKG) 03/13/2007    Lanney Gins, PT, DPT 02/14/17 5:04 PM  [facility] [address]  [place], [place], [address] Phone: [phone]   Fax:  [phone]  Name: Makylee Sanborn MRN: [id number] Date of Birth: 20-Feb-1951

## 2017-02-15 ENCOUNTER — Ambulatory Visit: Payer: BLUE CROSS/BLUE SHIELD | Attending: Specialist

## 2017-02-15 DIAGNOSIS — R600 Localized edema: Secondary | ICD-10-CM | POA: Insufficient documentation

## 2017-02-15 DIAGNOSIS — R262 Difficulty in walking, not elsewhere classified: Secondary | ICD-10-CM | POA: Diagnosis present

## 2017-02-15 DIAGNOSIS — M25562 Pain in left knee: Secondary | ICD-10-CM

## 2017-02-15 DIAGNOSIS — M25662 Stiffness of left knee, not elsewhere classified: Secondary | ICD-10-CM | POA: Insufficient documentation

## 2017-02-15 NOTE — Therapy (Addendum)
[facility] [address]  [place], [place], [address] Phone: [phone]   Fax:  [phone]  Physical Therapy Treatment  Patient Details  Name: Anna [name] MRN: [id number] Date of Birth: 15-Apr-1951 Referring Provider: Dr. Theda Sers  Encounter Date: 02/15/2017      PT End of Session - 02/15/17 1625    Visit Number 12   Number of Visits 12   Date for PT Re-Evaluation 02/16/17   PT Start Time 9485  pt. arrived late    PT Stop Time 1500   PT Time Calculation (min) 39 min   Activity Tolerance Patient tolerated treatment well   Behavior During Therapy [facility] for tasks assessed/performed      Past Medical History:  Diagnosis Date  . Arthritis    hands  . Atypical chest pain    Negative nuclear stress test  . Cholelithiasis    Single stone  . Cholelithiasis   . Dry eyes   . Dysrhythmia   . Esophageal stricture   . Family history of adverse reaction to anesthesia    pts sister had severe PONV  . Floaters    bilat   . GERD (gastroesophageal reflux disease)   . Hematuria, microscopic   . Hemorrhoids, internal    and external  . Hepatitis C 06/2015  . Hiatal hernia    EGD 07-2008  . Hypertension   . Numbness and tingling in hands    bilat   . Rhinitis    seasonal  . Tinnitus   . Vitamin D deficiency     Past Surgical History:  Procedure Laterality Date  . ABDOMINAL HYSTERECTOMY     fibroids &dysfunctional menes; USO  . APPENDECTOMY    . CHOLECYSTECTOMY N/A 02/04/2016   Procedure: LAPAROSCOPIC CHOLECYSTECTOMY ;  Surgeon: Armandina Gemma, MD;  Location: [facility];  Service: General;  Laterality: N/A;  . COLONOSCOPY  2007   Dr. Henrene Pastor, due 2017  . CT Angiogram  01/2006   Negative for PE  . G2 P1    . REFRACTIVE SURGERY    . UPPER GI ENDOSCOPY  07/2010   Esophagitis; distal esophageal ringlike structure    There were no vitals filed for this visit.         Subjective Assessment - 02/15/17 1746    Subjective Pt. doing well today noting she is comfortable transitioning to home program following today and d/c therapy.     Patient Stated Goals return normal function of knee   Currently in Pain? No/denies   Pain Score 0-No pain   Multiple Pain Sites No             [facility] PT Assessment - 02/15/17 1632      Observation/Other Assessments   Focus on Therapeutic Outcomes (FOTO)  58% (42% limitation)      Strength   Strength Assessment Site Hip;Knee;Ankle   Right/Left Hip Right;Left   Right Hip Flexion 4+/5   Right Hip Extension 4+/5   Right Hip ABduction 4+/5   Left Hip Flexion 4+/5   Left Hip Extension 4+/5   Left Hip ABduction 4+/5   Right/Left Knee Right;Left   Right Knee Flexion 5/5   Right Knee Extension 5/5   Left Knee Flexion 5/5   Left Knee Extension 4+/5   Right/Left Ankle Right;Left   Right Ankle Dorsiflexion 5/5   Left Ankle Dorsiflexion 5/5  [facility] Adult PT Treatment/Exercise - 02/15/17 1718      Self-Care   Self-Care Other Self-Care Comments   Other Self-Care Comments  Review and demo of current HEP to check for proper technique and answer any questions pt. may have     Lumbar Exercises: Aerobic   Stationary Bike Recumbent bike: lvl 3, 4 min      Lumbar Exercises: Supine   Bridge --   Bridge Limitations --                  PT Short Term Goals - 02/07/17 1722      PT SHORT TERM GOAL #1   Title Patient to be independent with initial HEP (01/19/17)   Status Achieved     PT SHORT TERM GOAL #2   Title Patient to improve L knee AROM equal to that of R knee (01/19/17)   Status Achieved           PT Long Term Goals - 02/15/17 1626      PT LONG TERM GOAL #1   Title Patient to be independent with advanced HEP (02/16/17)   Status Achieved     PT LONG TERM GOAL #2   Title Patient to improve L LE strength to >/= 4+/5 (02/16/17)   Status Achieved     PT LONG TERM GOAL #3   Title Patient to demonstrate proper  gait mechanics with good heel toe gait pattern and no evidence of instability (02/16/17)   Status Achieved     PT LONG TERM GOAL #4   Title Patient to demonstrate ability to ascend/descend 15 steps reciprocally without evidence of instability (02/16/17)   Status Partially Met  Mild L quad instability and 1/10 pain with eccentric lowering on descending                Plan - 02/15/17 1628    Clinical Impression Statement Pt. doing well today noting she feels comfortable transitioning to home program with d/c.  Pt. able to meet all LTG's today with exception of partially meeting stair navigation goal still with slight pain and instability on stairs.  HEP/gym program reviewed with pt. today with suggestions for future gym activities provided to pt. to promote LE flexibility and strength.  Pt. now d/c from therapy.    PT Treatment/Interventions ADLs/Self Care Home Management;Cryotherapy;Electrical Stimulation;Iontophoresis '4mg'$ /ml Dexamethasone;Moist Heat;Ultrasound;Neuromuscular re-education;Balance training;Therapeutic exercise;Therapeutic activities;Functional mobility training;Stair training;Gait training;Patient/family education;Manual techniques;Passive range of motion;Dry needling;Taping;Vasopneumatic Device      Patient will benefit from skilled therapeutic intervention in order to improve the following deficits and impairments:  Abnormal gait, Decreased activity tolerance, Decreased balance, Decreased range of motion, Decreased mobility, Decreased strength, Difficulty walking, Pain, Increased edema  Visit Diagnosis: Acute pain of left knee  Stiffness of left knee, not elsewhere classified  Difficulty in walking, not elsewhere classified  Localized edema     Problem List Patient Active Problem List   Diagnosis Date Noted  . Pre-diabetes 03/29/2016  . Cholelithiasis with chronic cholecystitis 02/04/2016  . Chronic cholecystitis with calculus 02/02/2016  . Esophagitis  09/03/2015  . Liver fibrosis 09/03/2015  . History of hepatitis C- cured 2017 07/22/2015  . B12 deficiency 11/07/2014  . Plantar fasciitis of right foot 08/13/2014  . Deformity of metatarsal bone of right foot 08/13/2014  . Dyspnea 02/08/2013  . HSV antigen DIF positive 02/09/2011  . HIATAL HERNIA 07/13/2010  . NONSPEC ELEVATION OF LEVELS OF TRANSAMINASE/LDH 04/16/2010  . ESOPHAGEAL STRICTURE 02/05/2010  . RHINITIS 09/25/2008  .  CHOLELITHIASIS 08/05/2008  . Essential hypertension 04/05/2007  . Nonspecific abnormal electrocardiogram (ECG) (EKG) 03/13/2007    Bess Harvest, PTA 02/15/17 6:11 PM    PHYSICAL THERAPY DISCHARGE SUMMARY  Visits from Start of Care: 12  Current functional level related to goals / functional outcomes: See above   Remaining deficits: See above   Education / Equipment: HEP  Plan: Patient agrees to discharge.  Patient goals were partially met. Patient is being discharged due to being pleased with the current functional level.  ?????    Lanney Gins, PT, DPT 02/23/17 1:28 PM  [facility] [address]  [place], [place], [address] Phone: [phone]   Fax:  [phone]  Name: Danyiel Crespin MRN: [id number] Date of Birth: January 10, 1951

## 2017-03-03 MED FILL — FLUTICASONE PROP 50 MCG SPR: 50 | 30 days supply | Qty: 16 | Fill #2

## 2017-03-27 MED FILL — CELECOXIB 200 MG CAPSULE: 200 | 30 days supply | Qty: 30 | Fill #0

## 2017-04-24 MED FILL — CELECOXIB 200 MG CAPSULE: 200 | 30 days supply | Qty: 30 | Fill #1

## 2017-04-24 MED FILL — FLUTICASONE PROP 50 MCG SPR: 50 | 30 days supply | Qty: 16 | Fill #3
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
## 2017-05-17 ENCOUNTER — Ambulatory Visit (INDEPENDENT_AMBULATORY_CARE_PROVIDER_SITE_OTHER): Payer: BLUE CROSS/BLUE SHIELD | Admitting: Family Medicine

## 2017-05-17 ENCOUNTER — Encounter: Payer: Self-pay | Admitting: Family Medicine

## 2017-05-17 VITALS — BP 140/88 | HR 53 | Temp 98.0°F | Ht 61.0 in | Wt 161.0 lb

## 2017-05-17 DIAGNOSIS — E559 Vitamin D deficiency, unspecified: Secondary | ICD-10-CM | POA: Diagnosis not present

## 2017-05-17 DIAGNOSIS — R7303 Prediabetes: Secondary | ICD-10-CM | POA: Diagnosis not present

## 2017-05-17 DIAGNOSIS — R9431 Abnormal electrocardiogram [ECG] [EKG]: Secondary | ICD-10-CM

## 2017-05-17 DIAGNOSIS — Z0000 Encounter for general adult medical examination without abnormal findings: Secondary | ICD-10-CM

## 2017-05-17 DIAGNOSIS — I10 Essential (primary) hypertension: Secondary | ICD-10-CM | POA: Diagnosis not present

## 2017-05-17 DIAGNOSIS — S161XXA Strain of muscle, fascia and tendon at neck level, initial encounter: Secondary | ICD-10-CM | POA: Diagnosis not present

## 2017-05-17 DIAGNOSIS — Z5181 Encounter for therapeutic drug level monitoring: Secondary | ICD-10-CM | POA: Diagnosis not present

## 2017-05-17 DIAGNOSIS — Z13220 Encounter for screening for lipoid disorders: Secondary | ICD-10-CM

## 2017-05-17 MED ORDER — CYCLOBENZAPRINE HCL 10 MG PO TABS: 10.0000 mg | ORAL_TABLET | Freq: Two times a day (BID) | ORAL | 0 refills | Status: DC | PRN

## 2017-05-17 MED ORDER — LOSARTAN POTASSIUM 100 MG PO TABS: 100.0000 mg | ORAL_TABLET | Freq: Every day | ORAL | 3 refills | Status: DC

## 2017-05-17 MED ORDER — AMLODIPINE BESYLATE 5 MG PO TABS
5.0000 mg | ORAL_TABLET | Freq: Every day | ORAL | 3 refills | Status: DC
Start: 2017-05-17 — End: 2018-05-03

## 2017-05-17 MED FILL — CYCLOBENZAPRINE 10 MG TAB: 10 | 15 days supply | Qty: 30 | Fill #0

## 2017-05-17 NOTE — Patient Instructions (Addendum)
It was great to see you again today!  I will be in touch with your labs asap Try the flexeril as needed for neck spasm- however, this medication can make you sleepy! I would suggest taking only at bedtime, and try a 1/2 tablet first   Health Maintenance for Postmenopausal Women Menopause is a normal process in which your reproductive ability comes to an end. This process happens gradually over a span of months to years, usually between the ages of 11 and 44. Menopause is complete when you have missed 12 consecutive menstrual periods. It is important to talk with your health care provider about some of the most common conditions that affect postmenopausal women, such as heart disease, cancer, and bone loss (osteoporosis). Adopting a healthy lifestyle and getting preventive care can help to promote your health and wellness. Those actions can also lower your chances of developing some of these common conditions. What should I know about menopause? During menopause, you may experience a number of symptoms, such as:  Moderate-to-severe hot flashes.  Night sweats.  Decrease in sex drive.  Mood swings.  Headaches.  Tiredness.  Irritability.  Memory problems.  Insomnia.  Choosing to treat or not to treat menopausal changes is an individual decision that you make with your health care provider. What should I know about hormone replacement therapy and supplements? Hormone therapy products are effective for treating symptoms that are associated with menopause, such as hot flashes and night sweats. Hormone replacement carries certain risks, especially as you become older. If you are thinking about using estrogen or estrogen with progestin treatments, discuss the benefits and risks with your health care provider. What should I know about heart disease and stroke? Heart disease, heart attack, and stroke become more likely as you age. This may be due, in part, to the hormonal changes that your body  experiences during menopause. These can affect how your body processes dietary fats, triglycerides, and cholesterol. Heart attack and stroke are both medical emergencies. There are many things that you can do to help prevent heart disease and stroke:  Have your blood pressure checked at least every 1-2 years. High blood pressure causes heart disease and increases the risk of stroke.  If you are 69-84 years old, ask your health care provider if you should take aspirin to prevent a heart attack or a stroke.  Do not use any tobacco products, including cigarettes, chewing tobacco, or electronic cigarettes. If you need help quitting, ask your health care provider.  It is important to eat a healthy diet and maintain a healthy weight. ? Be sure to include plenty of vegetables, fruits, low-fat dairy products, and lean protein. ? Avoid eating foods that are high in solid fats, added sugars, or salt (sodium).  Get regular exercise. This is one of the most important things that you can do for your health. ? Try to exercise for at least 150 minutes each week. The type of exercise that you do should increase your heart rate and make you sweat. This is known as moderate-intensity exercise. ? Try to do strengthening exercises at least twice each week. Do these in addition to the moderate-intensity exercise.  Know your numbers.Ask your health care provider to check your cholesterol and your blood glucose. Continue to have your blood tested as directed by your health care provider.  What should I know about cancer screening? There are several types of cancer. Take the following steps to reduce your risk and to catch any cancer  development as early as possible. Breast Cancer  Practice breast self-awareness. ? This means understanding how your breasts normally appear and feel. ? It also means doing regular breast self-exams. Let your health care provider know about any changes, no matter how small.  If you  are 40 or older, have a clinician do a breast exam (clinical breast exam or CBE) every year. Depending on your age, family history, and medical history, it may be recommended that you also have a yearly breast X-ray (mammogram).  If you have a family history of breast cancer, talk with your health care provider about genetic screening.  If you are at high risk for breast cancer, talk with your health care provider about having an MRI and a mammogram every year.  Breast cancer (BRCA) gene test is recommended for women who have family members with BRCA-related cancers. Results of the assessment will determine the need for genetic counseling and BRCA1 and for BRCA2 testing. BRCA-related cancers include these types: ? Breast. This occurs in males or females. ? Ovarian. ? Tubal. This may also be called fallopian tube cancer. ? Cancer of the abdominal or pelvic lining (peritoneal cancer). ? Prostate. ? Pancreatic.  Cervical, Uterine, and Ovarian Cancer Your health care provider may recommend that you be screened regularly for cancer of the pelvic organs. These include your ovaries, uterus, and vagina. This screening involves a pelvic exam, which includes checking for microscopic changes to the surface of your cervix (Pap test).  For women ages 21-65, health care providers may recommend a pelvic exam and a Pap test every three years. For women ages 51-65, they may recommend the Pap test and pelvic exam, combined with testing for human papilloma virus (HPV), every five years. Some types of HPV increase your risk of cervical cancer. Testing for HPV may also be done on women of any age who have unclear Pap test results.  Other health care providers may not recommend any screening for nonpregnant women who are considered low risk for pelvic cancer and have no symptoms. Ask your health care provider if a screening pelvic exam is right for you.  If you have had past treatment for cervical cancer or a  condition that could lead to cancer, you need Pap tests and screening for cancer for at least 20 years after your treatment. If Pap tests have been discontinued for you, your risk factors (such as having a new sexual partner) need to be reassessed to determine if you should start having screenings again. Some women have medical problems that increase the chance of getting cervical cancer. In these cases, your health care provider may recommend that you have screening and Pap tests more often.  If you have a family history of uterine cancer or ovarian cancer, talk with your health care provider about genetic screening.  If you have vaginal bleeding after reaching menopause, tell your health care provider.  There are currently no reliable tests available to screen for ovarian cancer.  Lung Cancer Lung cancer screening is recommended for adults 73-16 years old who are at high risk for lung cancer because of a history of smoking. A yearly low-dose CT scan of the lungs is recommended if you:  Currently smoke.  Have a history of at least 30 pack-years of smoking and you currently smoke or have quit within the past 15 years. A pack-year is smoking an average of one pack of cigarettes per day for one year.  Yearly screening should:  Continue until it  has been 15 years since you quit.  Stop if you develop a health problem that would prevent you from having lung cancer treatment.  Colorectal Cancer  This type of cancer can be detected and can often be prevented.  Routine colorectal cancer screening usually begins at age 19 and continues through age 71.  If you have risk factors for colon cancer, your health care provider may recommend that you be screened at an earlier age.  If you have a family history of colorectal cancer, talk with your health care provider about genetic screening.  Your health care provider may also recommend using home test kits to check for hidden blood in your stool.  A  small camera at the end of a tube can be used to examine your colon directly (sigmoidoscopy or colonoscopy). This is done to check for the earliest forms of colorectal cancer.  Direct examination of the colon should be repeated every 5-10 years until age 51. However, if early forms of precancerous polyps or small growths are found or if you have a family history or genetic risk for colorectal cancer, you may need to be screened more often.  Skin Cancer  Check your skin from head to toe regularly.  Monitor any moles. Be sure to tell your health care provider: ? About any new moles or changes in moles, especially if there is a change in a mole's shape or color. ? If you have a mole that is larger than the size of a pencil eraser.  If any of your family members has a history of skin cancer, especially at a young age, talk with your health care provider about genetic screening.  Always use sunscreen. Apply sunscreen liberally and repeatedly throughout the day.  Whenever you are outside, protect yourself by wearing long sleeves, pants, a wide-brimmed hat, and sunglasses.  What should I know about osteoporosis? Osteoporosis is a condition in which bone destruction happens more quickly than new bone creation. After menopause, you may be at an increased risk for osteoporosis. To help prevent osteoporosis or the bone fractures that can happen because of osteoporosis, the following is recommended:  If you are 75-43 years old, get at least 1,000 mg of calcium and at least 600 mg of vitamin D per day.  If you are older than age 38 but younger than age 88, get at least 1,200 mg of calcium and at least 600 mg of vitamin D per day.  If you are older than age 21, get at least 1,200 mg of calcium and at least 800 mg of vitamin D per day.  Smoking and excessive alcohol intake increase the risk of osteoporosis. Eat foods that are rich in calcium and vitamin D, and do weight-bearing exercises several times  each week as directed by your health care provider. What should I know about how menopause affects my mental health? Depression may occur at any age, but it is more common as you become older. Common symptoms of depression include:  Low or sad mood.  Changes in sleep patterns.  Changes in appetite or eating patterns.  Feeling an overall lack of motivation or enjoyment of activities that you previously enjoyed.  Frequent crying spells.  Talk with your health care provider if you think that you are experiencing depression. What should I know about immunizations? It is important that you get and maintain your immunizations. These include:  Tetanus, diphtheria, and pertussis (Tdap) booster vaccine.  Influenza every year before the flu season begins.  Pneumonia vaccine.  Shingles vaccine.  Your health care provider may also recommend other immunizations. This information is not intended to replace advice given to you by your health care provider. Make sure you discuss any questions you have with your health care provider. Document Released: 08/26/2005 Document Revised: 01/22/2016 Document Reviewed: 04/07/2015 Elsevier Interactive Patient Education  2018 [employer]  

## 2017-05-18 LAB — COMPREHENSIVE METABOLIC PANEL
ALBUMIN: 4.6 g/dL (ref 3.5–5.2)
ALK PHOS: 44 U/L (ref 39–117)
ALT: 13 U/L (ref 0–35)
AST: 16 U/L (ref 0–37)
BUN: 14 mg/dL (ref 6–23)
CALCIUM: 10.3 mg/dL (ref 8.4–10.5)
CHLORIDE: 106 meq/L (ref 96–112)
CO2: 30 mEq/L (ref 19–32)
Creatinine, Ser: 0.98 mg/dL (ref 0.40–1.20)
GFR: 73.34 mL/min (ref >60.00)
Glucose, Bld: 93 mg/dL (ref 70–99)
POTASSIUM: 4.6 meq/L (ref 3.5–5.1)
SODIUM: 144 meq/L (ref 135–145)
TOTAL PROTEIN: 7.7 g/dL (ref 6.0–8.3)
Total Bilirubin: 0.3 mg/dL (ref 0.2–1.2)

## 2017-05-18 LAB — LIPID PANEL
CHOLESTEROL: 209 mg/dL — AB (ref 0–200)
HDL: 52.4 mg/dL (ref >39.00)
LDL Cholesterol: 137 mg/dL — ABNORMAL HIGH (ref 0–99)
NonHDL: 156.89
Total CHOL/HDL Ratio: 4
Triglycerides: 99 mg/dL (ref 0.0–149.0)
VLDL: 19.8 mg/dL (ref 0.0–40.0)

## 2017-05-18 LAB — CBC
HEMATOCRIT: 43.3 % (ref 36.0–46.0)
HEMOGLOBIN: 14.3 g/dL (ref 12.0–15.0)
MCHC: 33 g/dL (ref 30.0–36.0)
MCV: 89.5 fl (ref 78.0–100.0)
PLATELETS: 218 10*3/uL (ref 150.0–400.0)
RBC: 4.84 Mil/uL (ref 3.87–5.11)
RDW: 13.8 % (ref 11.5–15.5)
WBC: 7.9 10*3/uL (ref 4.0–10.5)

## 2017-05-18 LAB — HEMOGLOBIN A1C: HEMOGLOBIN A1C: 5.7 % (ref 4.6–6.5)

## 2017-05-18 LAB — VITAMIN D 25 HYDROXY (VIT D DEFICIENCY, FRACTURES): VITD: 20.37 ng/mL — ABNORMAL LOW (ref 30.00–100.00)

## 2017-05-19 MED ORDER — VITAMIN D (ERGOCALCIFEROL) 1.25 MG (50000 UNIT) PO CAPS: 50000.0000 [IU] | ORAL_CAPSULE | ORAL | 0 refills | Status: DC

## 2017-05-19 MED FILL — VIT D2 1.25 MG (50,000 UNIT: 1.25 MG | 27 days supply | Qty: 4 | Fill #0

## 2017-05-19 NOTE — Addendum Note (Signed)
Addended by: Lamar Blinks C on: 05/19/2017 05:12 PM   Modules accepted: Orders

## 2017-05-30 ENCOUNTER — Telehealth: Payer: Self-pay | Admitting: Radiology

## 2017-05-30 ENCOUNTER — Other Ambulatory Visit: Payer: Self-pay | Admitting: *Deleted

## 2017-05-30 ENCOUNTER — Ambulatory Visit: Payer: BLUE CROSS/BLUE SHIELD

## 2017-05-30 DIAGNOSIS — R9431 Abnormal electrocardiogram [ECG] [EKG]: Secondary | ICD-10-CM

## 2017-05-30 NOTE — Telephone Encounter (Signed)
Patient came in to have a regular GXT for abnormal EKG. Unfortunately with her EKG changes a GXT is hard to interpret. Her EKG was shown to the DOD (Dr. Marlou Porch) and he recommends patient have a Stress Echo instead. Order has been placed.

## 2017-06-06 ENCOUNTER — Telehealth (HOSPITAL_COMMUNITY): Payer: Self-pay | Admitting: *Deleted

## 2017-06-06 NOTE — Telephone Encounter (Signed)
Patient given detailed instructions per Stress Test Requisition Sheet for test on 06/15/17 at 7:30.Patient Notified to arrive 30 minutes early, and that it is imperative to arrive on time for appointment to keep from having the test rescheduled.  Patient verbalized understanding. Anna [name]

## 2017-06-12 MED FILL — VIT D2 1.25 MG (50,000 UNIT: 1.25 MG | 27 days supply | Qty: 4 | Fill #1

## 2017-06-13 ENCOUNTER — Other Ambulatory Visit (HOSPITAL_BASED_OUTPATIENT_CLINIC_OR_DEPARTMENT_OTHER): Payer: Self-pay | Admitting: Obstetrics and Gynecology

## 2017-06-13 DIAGNOSIS — Z1231 Encounter for screening mammogram for malignant neoplasm of breast: Secondary | ICD-10-CM

## 2017-06-15 ENCOUNTER — Other Ambulatory Visit: Payer: Self-pay

## 2017-06-15 ENCOUNTER — Ambulatory Visit (HOSPITAL_COMMUNITY): Payer: BLUE CROSS/BLUE SHIELD

## 2017-06-15 ENCOUNTER — Other Ambulatory Visit: Payer: Self-pay | Admitting: Cardiology

## 2017-06-15 ENCOUNTER — Encounter (HOSPITAL_COMMUNITY): Payer: Self-pay | Admitting: Radiology

## 2017-06-15 ENCOUNTER — Ambulatory Visit (HOSPITAL_COMMUNITY): Payer: BLUE CROSS/BLUE SHIELD | Attending: Internal Medicine

## 2017-06-15 DIAGNOSIS — Z8249 Family history of ischemic heart disease and other diseases of the circulatory system: Secondary | ICD-10-CM | POA: Insufficient documentation

## 2017-06-15 DIAGNOSIS — I119 Hypertensive heart disease without heart failure: Secondary | ICD-10-CM | POA: Insufficient documentation

## 2017-06-15 DIAGNOSIS — R9431 Abnormal electrocardiogram [ECG] [EKG]: Secondary | ICD-10-CM

## 2017-06-15 DIAGNOSIS — R0600 Dyspnea, unspecified: Secondary | ICD-10-CM | POA: Diagnosis not present

## 2017-06-15 DIAGNOSIS — I253 Aneurysm of heart: Secondary | ICD-10-CM | POA: Insufficient documentation

## 2017-06-15 DIAGNOSIS — Z8619 Personal history of other infectious and parasitic diseases: Secondary | ICD-10-CM | POA: Insufficient documentation

## 2017-06-15 MED ORDER — PERFLUTREN LIPID MICROSPHERE
1.0000 mL | INTRAVENOUS | Status: AC | PRN
  Administered 2017-06-15: 2 mL via INTRAVENOUS

## 2017-06-15 NOTE — Progress Notes (Signed)
Stress echocardiogram was cancelled and changed to a transthoracic echocardiogram with [facility] due to findings in the preliminary limited echocardiogram. The test change was done in accordance to department policy made by the Medical Director, Dr. Meda Coffee.

## 2017-06-19 ENCOUNTER — Telehealth: Payer: Self-pay | Admitting: Family Medicine

## 2017-06-19 NOTE — Telephone Encounter (Signed)
Copied from [place]. Topic: General - Other >> Jun 19, 2017  3:41 PM Anna [name] wrote: Reason for CRM: Requesting lab results   Called pt back- she was looking for the results of her recent echo   I had sent her for a treadmill but this was converted to a stress echo. I have asked the ordering cardiologist Marlou Porch) to help me interpret her results

## 2017-06-22 ENCOUNTER — Telehealth: Payer: Self-pay | Admitting: Cardiology

## 2017-06-22 NOTE — Telephone Encounter (Signed)
Notes recorded by Jerline Pain, MD on 06/21/2017 at 5:12 PM EST Overall reassuring EF, normal pump function. Mild LVH Candee Furbish, MD

## 2017-06-22 NOTE — Telephone Encounter (Signed)
New message  Pt verbalized that she is calling for the rn  She had a test done on 06/15/2017   She and her PCP office has tried to get the results and answers on the study  Please call patient

## 2017-06-22 NOTE — Telephone Encounter (Signed)
Reviewed results of echo with patient who states understanding.

## 2017-06-26 ENCOUNTER — Telehealth: Payer: Self-pay | Admitting: Family Medicine

## 2017-06-26 DIAGNOSIS — R9431 Abnormal electrocardiogram [ECG] [EKG]: Secondary | ICD-10-CM

## 2017-06-26 NOTE — Telephone Encounter (Signed)
-----   Message from Jerline Pain, MD sent at 06/22/2017  3:59 PM EST ----- Hi Anna [name]- no bother at all.  In fact, it was a little confusing to me what had happened.  When I saw her original EKG, her ST-T wave changes would not make an exercise treadmill test interpretable.  She then was set up for a stress echo but since she had not had a transthoracic echo beforehand, the echo tech decided to do one and discovered her left ventricular hypertrophy with hyperdynamic function and perhaps apical hypertrophy.  The technician decided to cancel the stress test portion of the echocardiogram based upon these findings.  I spoke with her about this and received clarification on this.  I think based upon the echocardiogram, chest tightness, it may make sense for [place] to see her in consultation to discuss the possibility of apical hypertrophy variant cardiomyopathy which goes along with her ECG.  Sometimes these patients do get chest pain just because of they are thickened myocardium.  I spoke with Dr. Meda Coffee as well and it may make sense for [place] to pursue a coronary CT scan to define her anatomy in this situation.  I would be happy to see her in consultation if you would like and I am sorry about the confusion.  Candee Furbish, MD  ----- Message ----- From: Darreld Mclean, MD Sent: 06/19/2017   5:05 PM To: Jerline Pain, MD  Hi Mark- sorry to bother you about this echo,  I think you were likely just the person who ended up signing this order.  However, I need a little help with the results.  I had sent her for a treadmill for chest tightness with exertion, ended up being converted to an echo. I am not sure how to interpret her results as far as risk stratification for CAD. Thank you! [name]

## 2017-06-26 NOTE — Telephone Encounter (Signed)
Received message from Dr. Marlou Porch- I will refer pt to see him.  Called pt and LMOM with this update, will place referral

## 2017-06-26 NOTE — Telephone Encounter (Signed)
Pt. Returning calling would like to speak to Dr. Lorelei Pont more about the referral. Please return pt. Call.

## 2017-06-28 ENCOUNTER — Ambulatory Visit (HOSPITAL_BASED_OUTPATIENT_CLINIC_OR_DEPARTMENT_OTHER): Payer: BLUE CROSS/BLUE SHIELD

## 2017-06-28 NOTE — Telephone Encounter (Signed)
Called Anna [name] and explained this somewhat confusing situation to her- she understands and will look for a call from cardiology scheduling a consultation visit

## 2017-06-28 NOTE — Telephone Encounter (Signed)
FYI

## 2017-07-01 ENCOUNTER — Ambulatory Visit (HOSPITAL_BASED_OUTPATIENT_CLINIC_OR_DEPARTMENT_OTHER)
Admission: RE | Admit: 2017-07-01 | Discharge: 2017-07-01 | Disposition: A | Discharge status: Home / Self Care | Payer: BLUE CROSS/BLUE SHIELD | Source: Ambulatory Visit | Attending: Obstetrics and Gynecology | Admitting: Obstetrics and Gynecology

## 2017-07-01 DIAGNOSIS — Z1231 Encounter for screening mammogram for malignant neoplasm of breast: Secondary | ICD-10-CM | POA: Insufficient documentation

## 2017-07-05 DIAGNOSIS — M1990 Unspecified osteoarthritis, unspecified site: Secondary | ICD-10-CM | POA: Insufficient documentation

## 2017-07-05 DIAGNOSIS — K222 Esophageal obstruction: Secondary | ICD-10-CM | POA: Insufficient documentation

## 2017-07-05 DIAGNOSIS — R3129 Other microscopic hematuria: Secondary | ICD-10-CM | POA: Insufficient documentation

## 2017-07-05 DIAGNOSIS — H04123 Dry eye syndrome of bilateral lacrimal glands: Secondary | ICD-10-CM | POA: Insufficient documentation

## 2017-07-05 DIAGNOSIS — R0789 Other chest pain: Secondary | ICD-10-CM | POA: Insufficient documentation

## 2017-07-05 DIAGNOSIS — J310 Chronic rhinitis: Secondary | ICD-10-CM | POA: Insufficient documentation

## 2017-07-05 DIAGNOSIS — K648 Other hemorrhoids: Secondary | ICD-10-CM | POA: Insufficient documentation

## 2017-07-05 DIAGNOSIS — I499 Cardiac arrhythmia, unspecified: Secondary | ICD-10-CM | POA: Insufficient documentation

## 2017-07-05 DIAGNOSIS — K219 Gastro-esophageal reflux disease without esophagitis: Secondary | ICD-10-CM | POA: Insufficient documentation

## 2017-07-05 DIAGNOSIS — H43399 Other vitreous opacities, unspecified eye: Secondary | ICD-10-CM | POA: Insufficient documentation

## 2017-07-05 DIAGNOSIS — H9319 Tinnitus, unspecified ear: Secondary | ICD-10-CM | POA: Insufficient documentation

## 2017-07-05 DIAGNOSIS — I10 Essential (primary) hypertension: Secondary | ICD-10-CM | POA: Insufficient documentation

## 2017-07-05 DIAGNOSIS — Z8489 Family history of other specified conditions: Secondary | ICD-10-CM | POA: Insufficient documentation

## 2017-07-05 DIAGNOSIS — K449 Diaphragmatic hernia without obstruction or gangrene: Secondary | ICD-10-CM | POA: Insufficient documentation

## 2017-07-05 DIAGNOSIS — E559 Vitamin D deficiency, unspecified: Secondary | ICD-10-CM | POA: Insufficient documentation

## 2017-07-14 MED FILL — VIT D2 1.25 MG (50,000 UNIT: 1.25 MG | 28 days supply | Qty: 4 | Fill #2

## 2017-07-20 ENCOUNTER — Encounter: Payer: Self-pay | Admitting: Cardiology

## 2017-07-20 ENCOUNTER — Ambulatory Visit (INDEPENDENT_AMBULATORY_CARE_PROVIDER_SITE_OTHER): Payer: BLUE CROSS/BLUE SHIELD | Admitting: Cardiology

## 2017-07-20 VITALS — BP 172/90 | HR 67 | Ht 61.0 in | Wt 162.8 lb

## 2017-07-20 DIAGNOSIS — I422 Other hypertrophic cardiomyopathy: Secondary | ICD-10-CM

## 2017-07-20 DIAGNOSIS — I209 Angina pectoris, unspecified: Secondary | ICD-10-CM

## 2017-07-20 DIAGNOSIS — R9431 Abnormal electrocardiogram [ECG] [EKG]: Secondary | ICD-10-CM | POA: Diagnosis not present

## 2017-07-20 DIAGNOSIS — R0602 Shortness of breath: Secondary | ICD-10-CM

## 2017-07-20 DIAGNOSIS — I10 Essential (primary) hypertension: Secondary | ICD-10-CM | POA: Diagnosis not present

## 2017-07-20 MED ORDER — METOPROLOL TARTRATE 25 MG PO TABS: ORAL_TABLET | ORAL | 0 refills | Status: DC

## 2017-07-20 MED FILL — METOPROLOL TARTRATE 25 MG T: 25 | 1 days supply | Qty: 1 | Fill #0

## 2017-07-20 NOTE — Progress Notes (Signed)
Cardiology Office Note:    Date:  07/20/2017   ID:  Anna [name], DOB 1950/08/30, MRN [id number]  PCP:  Darreld Mclean, MD  Cardiologist:  Candee Furbish, MD    Referring MD: Darreld Mclean, MD     History of Present Illness:    Anna [name] is a 67 y.o. female here for evaluation of abnormal echocardiogram with apical ventricular hypertrophy and hyperdynamic function at the request of Dr. Edilia Bo.  Originally she was referred to us/heart care for an exercise treadmill test because of occasional chest discomfort.  Felt SOB up stair, hill, feels SOB fleeting, continue to walk and goes away. Before performing the test, [place] in treadmill noted that her EKG was abnormal and would not be interpretable.  I agreed and set her up for a stress echocardiogram.  Prior to the stress echocardiogram, a baseline echocardiogram was performed and demonstrated her hyperdynamic function and apical hypertrophy.  The treadmill portion of the study was not performed.  Previously she was describing chest tightness at times off and on with exercise.  As she continues to walk it usually goes away.  Several years ago she noted that she had an unusual EKG and a stress echo that was normal.  Youngest sister was born with heart issues. Mom and Dad no CAD or sudden cardiac death.   130/88 at home.   Past Medical History:  Diagnosis Date  . Arthritis    hands  . Atypical chest pain    Negative nuclear stress test  . Cholelithiasis    Single stone  . Cholelithiasis   . Dry eyes   . Dysrhythmia   . Esophageal stricture   . Family history of adverse reaction to anesthesia    pts sister had severe PONV  . Floaters    bilat   . GERD (gastroesophageal reflux disease)   . Hematuria, microscopic   . Hemorrhoids, internal    and external  . Hepatitis C 06/2015  . Hiatal hernia    EGD 07-2008  . Hypertension   . Numbness and tingling in hands    bilat   . Rhinitis    seasonal  . Tinnitus   .  Vitamin D deficiency     Past Surgical History:  Procedure Laterality Date  . ABDOMINAL HYSTERECTOMY     fibroids &dysfunctional menes; USO  . APPENDECTOMY    . CHOLECYSTECTOMY N/A 02/04/2016   Procedure: LAPAROSCOPIC CHOLECYSTECTOMY ;  Surgeon: Armandina Gemma, MD;  Location: [facility];  Service: [name];  Laterality: N/A;  . COLONOSCOPY  2007   Dr. Henrene Pastor, due 2017  . CT Angiogram  01/2006   Negative for PE  . G2 P1    . REFRACTIVE SURGERY    . UPPER GI ENDOSCOPY  07/2010   Esophagitis; distal esophageal ringlike structure    Current Medications: Current Meds  Medication Sig  . amLODipine (NORVASC) 5 MG tablet Take 1 tablet (5 mg total) by mouth daily.  . celecoxib (CELEBREX) 200 MG capsule   . fluticasone (FLONASE) 50 MCG/ACT nasal spray   . losartan (COZAAR) 100 MG tablet Take 1 tablet (100 mg total) by mouth daily.  . Vitamin D, Ergocalciferol, (DRISDOL) 50000 units CAPS capsule Take 1 capsule (50,000 Units total) by mouth every 7 (seven) days.     Allergies:   Tramadol   Social History   Socioeconomic History  . Marital status: Divorced    Spouse name: None  . Number of children: 1  . Years of  education: None  . Highest education level: None  Social Needs  . Financial resource strain: None  . Food insecurity - worry: None  . Food insecurity - inability: None  . Transportation needs - medical: None  . Transportation needs - non-medical: None  Occupational History  . Occupation: ACCOUNTING    Employer: [employer]  Tobacco Use  . Smoking status: Never Smoker  . Smokeless tobacco: Never Used  Substance and Sexual Activity  . Alcohol use: No    Alcohol/week: 0.0 oz    Comment:   occasionally  . Drug use: No  . Sexual activity: None  Other Topics Concern  . None  Social History Narrative   No diet. Patient does not get regular exercise     Family History: The patient's family history includes Arthritis in her mother; Heart attack (age of onset: 52) in her  sister; Hypertension in her brother, maternal grandmother, mother, sister, sister, and sister; Parkinsonism in her mother; Prostate cancer in her brother and father. There is no history of Diabetes, Stroke, Colon cancer, or Stomach cancer. ROS:   Please see the history of present illness.     All other systems reviewed and are negative.  EKGs/Labs/Other Studies Reviewed:    The following studies were reviewed today:   EKG:  EKG is not ordered today.  Prior EKG from 05/17/17 shows sinus rhythm with diffuse T wave inversion, LVH.  Abnormal, hypertrophic-like pattern. Recent Labs: 05/17/2017: ALT 13; BUN 14; Creatinine, Ser 0.98; Hemoglobin 14.3; Platelets 218.0; Potassium 4.6; Sodium 144  Recent Lipid Panel    Component Value Date/Time   CHOL 209 (H) 05/17/2017 1549   TRIG 99.0 05/17/2017 1549   HDL 52.40 05/17/2017 1549   CHOLHDL 4 05/17/2017 1549   VLDL 19.8 05/17/2017 1549   LDLCALC 137 (H) 05/17/2017 1549   LDLDIRECT 139.8 02/02/2011 1600    Physical Exam:    VS:  BP (!) 172/90 (BP Location: Left Arm, Patient Position: Sitting, Cuff Size: Normal)   Pulse 67   Ht 5\' 1"  (1.549 m)   Wt 162 lb 12.8 oz (73.8 kg)   SpO2 97%   BMI 30.76 kg/m     Wt Readings from Last 3 Encounters:  07/20/17 162 lb 12.8 oz (73.8 kg)  05/17/17 161 lb (73 kg)  09/06/16 167 lb (75.8 kg)     GEN:  Well nourished, well developed in no acute distress HEENT: Normal NECK: No JVD; No carotid bruits LYMPHATICS: No lymphadenopathy CARDIAC: RRR, 1/6 SM, no rubs, gallops RESPIRATORY:  Clear to auscultation without rales, wheezing or rhonchi  ABDOMEN: Soft, non-tender, non-distended MUSCULOSKELETAL:  No edema; No deformity  SKIN: Warm and dry NEUROLOGIC:  Alert and oriented x 3 PSYCHIATRIC:  Normal affect   ASSESSMENT:    1. Apical variant hypertrophic cardiomyopathy ([place])   2. Essential hypertension   3. Shortness of breath   4. Nonspecific abnormal electrocardiogram (ECG) (EKG)   5. Angina  pectoris (HCC)    PLAN:    In order of problems listed above:  Apical hypertrophy/angina/chest tightness/shortness of breath with activity/abnormal EKG marked abnormality with T wave inversion diffusely, hypertrophic pattern -We will check cardiac CT to see if there are any significant coronary abnormalities. -She has had no episodes of syncope, no early family history of sudden cardiac death.  No palpitations.  Consider 24-hour Holter monitor in the future to look for any signs of adverse arrhythmias.  Essential hypertension -Continue with amlodipine and Cozaar.  Blood pressure at home  is usually in the 130s over 80s.  Medication Adjustments/Labs and Tests Ordered: Current medicines are reviewed at length with the patient today.  Concerns regarding medicines are outlined above.  Orders Placed This Encounter  Procedures  . CT CORONARY MORPH W/CTA COR W/SCORE W/CA W/CM &/OR WO/CM  . CT CORONARY FRACTIONAL FLOW RESERVE DATA PREP  . CT CORONARY FRACTIONAL FLOW RESERVE FLUID ANALYSIS   Meds ordered this encounter  Medications  . metoprolol tartrate (LOPRESSOR) 25 MG tablet    Sig: Take 1 tablet 1 hour prior to your cardiac ct scan    Dispense:  1 tablet    Refill:  0    Signed, Candee Furbish, MD  07/20/2017 4:01 PM    [place]

## 2017-07-20 NOTE — Patient Instructions (Signed)
Medication Instructions:  Please take Lopressor 25 mg 1 hour prior to your CT scan. Continue all other medications as listed.  Labwork: None  Testing/Procedures: Cardiac CT scanning, (CAT scanning), is a noninvasive, special x-ray that produces cross-sectional images of the body using x-rays and a computer. CT scans help physicians diagnose and treat medical conditions. For some CT exams, a contrast material is used to enhance visibility in the area of the body being studied. CT scans provide greater clarity and reveal more details than regular x-ray exams.  Follow-Up: Follow up as needed after the above testing.  Thank you for choosing [facility]!!

## 2017-07-24 MED FILL — CELECOXIB 200 MG CAP: 200 | 30 days supply | Qty: 30 | Fill #0

## 2017-08-02 ENCOUNTER — Telehealth: Payer: Self-pay | Admitting: Cardiology

## 2017-08-02 DIAGNOSIS — I10 Essential (primary) hypertension: Secondary | ICD-10-CM

## 2017-08-02 DIAGNOSIS — Z79899 Other long term (current) drug therapy: Secondary | ICD-10-CM

## 2017-08-02 NOTE — Telephone Encounter (Signed)
Reviewed instructions with patient.  All questions answered and she has been scheduled for lab work.  She will c/b with any questions or concerns.

## 2017-08-02 NOTE — Telephone Encounter (Signed)
Please arrive at the [facility] main entrance of [facility] on 08/24/17 at 1 pm  (30-45 minutes prior to test start time)  [facility] [place] [place], [place] [address] [phone]  Proceed to the [facility] Radiology Department (First Floor).  Please follow these instructions carefully (unless otherwise directed):  On the Night Before the Test: . Drink plenty of water. . Do not consume any caffeinated/decaffeinated beverages or chocolate 12 hours prior to your test. . Do not take any antihistamines 12 hours prior to your test.  On the Day of the Test: . Drink plenty of water. Do not drink any water within one hour of the test. . Do not eat any food 4 hours prior to the test. . You may take your regular medications prior to the test. . IF NOT ON A BETA BLOCKER - Take 50 mg of lopressor (metoprolol) one hour before the test.   After the Test: . Drink plenty of water. . After receiving IV contrast, you may experience a mild flushed feeling. This is normal. . On occasion, you may experience a mild rash up to 24 hours after the test. This is not dangerous. If this occurs, you can take Benadryl 25 mg and increase your fluid intake. . If you experience trouble breathing, this can be serious. If it is severe call 911 IMMEDIATELY. If it is mild, please call our office. . If you take any of these medications: Glipizide/Metformin, Avandament, Glucavance, please do not take 48 hours after completing test.

## 2017-08-02 NOTE — Telephone Encounter (Signed)
Call patient to schedule her Cardiac Ct and explain that she will need labs before her appointment on 08-24-17.  She stated that no one explain the test to her and that she was not aware of the labs.   She is aware of a pill that she need to take before the test.

## 2017-08-22 ENCOUNTER — Other Ambulatory Visit: Payer: BLUE CROSS/BLUE SHIELD

## 2017-08-22 DIAGNOSIS — Z79899 Other long term (current) drug therapy: Secondary | ICD-10-CM

## 2017-08-22 DIAGNOSIS — I10 Essential (primary) hypertension: Secondary | ICD-10-CM

## 2017-08-22 LAB — BASIC METABOLIC PANEL
BUN/Creatinine Ratio: 12 (ref 12–28)
BUN: 12 mg/dL (ref 8–27)
CALCIUM: 9.4 mg/dL (ref 8.7–10.3)
CHLORIDE: 101 mmol/L (ref 96–106)
CO2: 23 mmol/L (ref 20–29)
Creatinine, Ser: 0.97 mg/dL (ref 0.57–1.00)
GFR calc Af Amer: 71 mL/min/{1.73_m2} (ref >59)
GFR calc non Af Amer: 62 mL/min/{1.73_m2} (ref >59)
GLUCOSE: 81 mg/dL (ref 65–99)
POTASSIUM: 4.4 mmol/L (ref 3.5–5.2)
Sodium: 141 mmol/L (ref 134–144)

## 2017-08-24 ENCOUNTER — Ambulatory Visit (HOSPITAL_COMMUNITY)
Admission: RE | Admit: 2017-08-24 | Discharge: 2017-08-24 | Disposition: A | Discharge status: Home / Self Care | Payer: BLUE CROSS/BLUE SHIELD | Source: Ambulatory Visit | Attending: Cardiology | Admitting: Cardiology

## 2017-08-24 ENCOUNTER — Ambulatory Visit (HOSPITAL_COMMUNITY): Payer: BLUE CROSS/BLUE SHIELD

## 2017-08-24 ENCOUNTER — Encounter (HOSPITAL_COMMUNITY): Payer: Self-pay

## 2017-08-24 DIAGNOSIS — I10 Essential (primary) hypertension: Secondary | ICD-10-CM | POA: Insufficient documentation

## 2017-08-24 DIAGNOSIS — R0602 Shortness of breath: Secondary | ICD-10-CM | POA: Diagnosis present

## 2017-08-24 DIAGNOSIS — I517 Cardiomegaly: Secondary | ICD-10-CM | POA: Insufficient documentation

## 2017-08-24 DIAGNOSIS — I209 Angina pectoris, unspecified: Secondary | ICD-10-CM | POA: Diagnosis present

## 2017-08-24 DIAGNOSIS — R9431 Abnormal electrocardiogram [ECG] [EKG]: Secondary | ICD-10-CM

## 2017-08-24 DIAGNOSIS — R0789 Other chest pain: Secondary | ICD-10-CM | POA: Diagnosis not present

## 2017-08-24 MED ORDER — NITROGLYCERIN 0.4 MG SL SUBL
SUBLINGUAL_TABLET | SUBLINGUAL | Status: AC
  Filled 2017-08-24: qty 2

## 2017-08-24 MED ORDER — NITROGLYCERIN 0.4 MG SL SUBL
0.8000 mg | SUBLINGUAL_TABLET | Freq: Once | SUBLINGUAL | Status: AC
  Administered 2017-08-24: 0.8 mg via SUBLINGUAL

## 2017-08-24 MED ORDER — IOPAMIDOL (ISOVUE-370) INJECTION 76%
INTRAVENOUS | Status: AC
  Administered 2017-08-24: 100 mL
  Filled 2017-08-24: qty 100

## 2017-08-24 NOTE — Progress Notes (Signed)
CT scan completed. Tolerated well. D/C home in no distress.  

## 2017-08-28 ENCOUNTER — Telehealth: Payer: Self-pay | Admitting: Cardiology

## 2017-08-28 DIAGNOSIS — I422 Other hypertrophic cardiomyopathy: Secondary | ICD-10-CM

## 2017-08-28 MED ORDER — METOPROLOL SUCCINATE ER 25 MG PO TB24: 25.0000 mg | ORAL_TABLET | Freq: Every day | ORAL | 6 refills | Status: DC

## 2017-08-28 MED FILL — METOPROLOL SUCCINATE ER 25: 25 | 30 days supply | Qty: 30 | Fill #0

## 2017-08-28 NOTE — Telephone Encounter (Signed)
Anna [name] is calling to get the results from her CT Scan done last week . Please call

## 2017-08-28 NOTE — Telephone Encounter (Signed)
Notes recorded by Jerline Pain, MD on 08/25/2017 at 3:45 PM EST No CAD. However as seen on ECHO, there is apical variant hypertrophic cardiomyopathy (thickness of apex). Because of this, lets start Toprol XL 25mg  PO QD. Also order a 24 hour holter to exclude arrhythmia. Have her return to clinic in 2 months with APP thanks. Candee Furbish, MD

## 2017-08-28 NOTE — Telephone Encounter (Signed)
Spoke with patient RE: CT results, orders and f/u.  RX sent into [facility] out[facility] [place], order placed for 24 hr monitor.  appt scheduled for f/u.

## 2017-10-06 ENCOUNTER — Encounter: Payer: Self-pay | Admitting: Cardiology

## 2017-10-11 ENCOUNTER — Other Ambulatory Visit (HOSPITAL_BASED_OUTPATIENT_CLINIC_OR_DEPARTMENT_OTHER): Payer: Self-pay | Admitting: Orthopedic Surgery

## 2017-10-11 ENCOUNTER — Ambulatory Visit: Payer: BLUE CROSS/BLUE SHIELD | Admitting: Podiatry

## 2017-10-11 DIAGNOSIS — M1712 Unilateral primary osteoarthritis, left knee: Secondary | ICD-10-CM

## 2017-10-14 ENCOUNTER — Ambulatory Visit (HOSPITAL_BASED_OUTPATIENT_CLINIC_OR_DEPARTMENT_OTHER)
Admission: RE | Admit: 2017-10-14 | Discharge: 2017-10-14 | Disposition: A | Discharge status: Home / Self Care | Payer: BLUE CROSS/BLUE SHIELD | Source: Ambulatory Visit | Attending: Orthopedic Surgery | Admitting: Orthopedic Surgery

## 2017-10-14 ENCOUNTER — Other Ambulatory Visit (HOSPITAL_BASED_OUTPATIENT_CLINIC_OR_DEPARTMENT_OTHER): Payer: Self-pay | Admitting: Orthopedic Surgery

## 2017-10-14 DIAGNOSIS — M948X6 Other specified disorders of cartilage, lower leg: Secondary | ICD-10-CM | POA: Insufficient documentation

## 2017-10-14 DIAGNOSIS — M1712 Unilateral primary osteoarthritis, left knee: Secondary | ICD-10-CM | POA: Insufficient documentation

## 2017-10-14 DIAGNOSIS — M84462A Pathological fracture, left tibia, initial encounter for fracture: Secondary | ICD-10-CM | POA: Diagnosis not present

## 2017-10-14 DIAGNOSIS — X58XXXA Exposure to other specified factors, initial encounter: Secondary | ICD-10-CM | POA: Insufficient documentation

## 2017-10-14 DIAGNOSIS — S83222A Peripheral tear of medial meniscus, current injury, left knee, initial encounter: Secondary | ICD-10-CM | POA: Insufficient documentation

## 2017-10-17 ENCOUNTER — Ambulatory Visit (INDEPENDENT_AMBULATORY_CARE_PROVIDER_SITE_OTHER): Payer: BLUE CROSS/BLUE SHIELD | Admitting: Cardiology

## 2017-10-17 ENCOUNTER — Encounter: Payer: Self-pay | Admitting: Cardiology

## 2017-10-17 VITALS — BP 152/84 | HR 71 | Ht 62.0 in | Wt 160.6 lb

## 2017-10-17 DIAGNOSIS — I209 Angina pectoris, unspecified: Secondary | ICD-10-CM | POA: Diagnosis not present

## 2017-10-17 DIAGNOSIS — I422 Other hypertrophic cardiomyopathy: Secondary | ICD-10-CM

## 2017-10-17 DIAGNOSIS — I10 Essential (primary) hypertension: Secondary | ICD-10-CM | POA: Diagnosis not present

## 2017-10-17 MED ORDER — METOPROLOL SUCCINATE ER 50 MG PO TB24: 50.0000 mg | ORAL_TABLET | Freq: Every day | ORAL | 3 refills | Status: AC

## 2017-10-17 NOTE — Progress Notes (Signed)
Cardiology Office Note:    Date:  11/04/17   ID:  Anna [name] General, DOB 1950/09/29, MRN [id number]  PCP:  Anna Mclean, MD  Cardiologist:  Candee Furbish, MD    Referring MD: Anna Mclean, MD     History of Present Illness:    Anna [name] is a 67 y.o. female here for follow up of abnormal echocardiogram/ cardiac CT with apical ventricular hypertrophy and hyperdynamic function. No CAD on CT.   Originally she was referred to us/heart care for an exercise treadmill test because of occasional chest discomfort.  Felt SOB up stair, hill, feels SOB fleeting, continue to walk and goes away. Before performing the test, Anna [name] in treadmill noted that her EKG was abnormal and would not be interpretable.  I agreed and set her up for a stress echocardiogram.  Prior to the stress echocardiogram, a baseline echocardiogram was performed and demonstrated her hyperdynamic function and apical hypertrophy.  The treadmill portion of the study was not performed.  Previously she was describing chest tightness at times off and on with exercise.  As she continues to walk it usually goes away.  Several years ago she noted that she had an unusual EKG and a stress echo that was normal.  Youngest sister was born with heart issues. Mom and Dad no CAD or sudden cardiac death.   130/88 at home.   Nov 04, 2017 - left knee pain after accident, this is been going on for years, main complaint today.  She is still feeling some shortness of breath when going up hills or inclines which started this whole process.  I explained to her what apical hypertrophic cardiomyopathy means, and potentially some of this chest discomfort/shortness of breath that she may feel up inclines may be because of the perfusion gradient difference at the apex with the left ventricular hypertrophy.  She has not been feeling any palpitations.  Once again she has not had any early family history of sudden cardiac death.  No fevers chills nausea vomiting  syncope bleeding  Past Medical History:  Diagnosis Date  . Arthritis    hands  . Atypical chest pain    Negative nuclear stress test  . Cholelithiasis    Single stone  . Cholelithiasis   . Dry eyes   . Dysrhythmia   . Esophageal stricture   . Family history of adverse reaction to anesthesia    pts sister had severe PONV  . Floaters    bilat   . GERD (gastroesophageal reflux disease)   . Hematuria, microscopic   . Hemorrhoids, internal    and external  . Hepatitis C 06/2015  . Hiatal hernia    EGD 07-2008  . Hypertension   . Numbness and tingling in hands    bilat   . Rhinitis    seasonal  . Tinnitus   . Vitamin D deficiency     Past Surgical History:  Procedure Laterality Date  . ABDOMINAL HYSTERECTOMY     fibroids &dysfunctional menes; USO  . APPENDECTOMY    . CHOLECYSTECTOMY N/A 02/04/2016   Procedure: LAPAROSCOPIC CHOLECYSTECTOMY ;  Surgeon: Armandina Gemma, MD;  Location: [facility];  Service: General;  Laterality: N/A;  . COLONOSCOPY  2007   Dr. Henrene Pastor, due 2017  . CT Angiogram  01/2006   Negative for PE  . G2 P1    . REFRACTIVE SURGERY    . UPPER GI ENDOSCOPY  07/2010   Esophagitis; distal esophageal ringlike structure    Current Medications: Current  Meds  Medication Sig  . amLODipine (NORVASC) 5 MG tablet Take 1 tablet (5 mg total) by mouth daily.  . fluticasone (FLONASE) 50 MCG/ACT nasal spray Place 1 spray into both nostrils daily.   Marland Kitchen losartan (COZAAR) 100 MG tablet Take 1 tablet (100 mg total) by mouth daily.  . metoprolol succinate (TOPROL-XL) 25 MG 24 hr tablet Take 1 tablet (25 mg total) by mouth daily.     Allergies:   Tramadol   Social History   Socioeconomic History  . Marital status: Divorced    Spouse name: Not on file  . Number of children: 1  . Years of education: Not on file  . Highest education level: Not on file  Occupational History  . Occupation: Product/process development scientist: [employer]  . Financial resource strain:  Not on file  . Food insecurity:    Worry: Not on file    Inability: Not on file  . Transportation needs:    Medical: Not on file    Non-medical: Not on file  Tobacco Use  . Smoking status: Never Smoker  . Smokeless tobacco: Never Used  Substance and Sexual Activity  . Alcohol use: No    Alcohol/week: 0.0 oz    Comment:   occasionally  . Drug use: No  . Sexual activity: Not on file  Lifestyle  . Physical activity:    Days per week: Not on file    Minutes per session: Not on file  . Stress: Not on file  Relationships  . Social connections:    Talks on phone: Not on file    Gets together: Not on file    Attends religious service: Not on file    Active member of club or organization: Not on file    Attends meetings of clubs or organizations: Not on file    Relationship status: Not on file  Other Topics Concern  . Not on file  Social History Narrative   No diet. Patient does not get regular exercise     Family History: The patient's family history includes Arthritis in her mother; Heart attack (age of onset: 33) in her sister; Hypertension in her brother, maternal grandmother, mother, sister, sister, and sister; Parkinsonism in her mother; Prostate cancer in her brother and father. There is no history of Diabetes, Stroke, Colon cancer, or Stomach cancer. ROS:   Please see the history of present illness.     All other ROS neg  EKGs/Labs/Other Studies Reviewed:    The following studies were reviewed today: Cardiac CT: 08/24/17 IMPRESSION: 1. Coronary calcium score of 0. This was 0 percentile for age and sex matched control.  2. Normal coronary origin with right dominance.  3. No evidence of CAD.  4. There is severe left ventricular hypertrophy predominantly in the apical segments that are obliterating LV cavity. No apical aneurysm is seen.  EKG:  EKG is not ordered today.  Prior EKG from 05/17/17 shows sinus rhythm with diffuse T wave inversion, LVH.  Abnormal,  hypertrophic-like pattern. Recent Labs: 05/17/2017: ALT 13; Hemoglobin 14.3; Platelets 218.0 08/22/2017: BUN 12; Creatinine, Ser 0.97; Potassium 4.4; Sodium 141  Recent Lipid Panel    Component Value Date/Time   CHOL 209 (H) 05/17/2017 1549   TRIG 99.0 05/17/2017 1549   HDL 52.40 05/17/2017 1549   CHOLHDL 4 05/17/2017 1549   VLDL 19.8 05/17/2017 1549   LDLCALC 137 (H) 05/17/2017 1549   LDLDIRECT 139.8 02/02/2011 1600    Physical  Exam:    VS:  BP (!) 152/84   Pulse 71   Ht 5\' 2"  (1.575 m)   Wt 160 lb 9.6 oz (72.8 kg)   SpO2 98%   BMI 29.37 kg/m     Wt Readings from Last 3 Encounters:  10/17/17 160 lb 9.6 oz (72.8 kg)  07/20/17 162 lb 12.8 oz (73.8 kg)  05/17/17 161 lb (73 kg)     GEN: Well nourished, well developed, in no acute distress  HEENT: normal  Neck: no JVD, carotid bruits, or masses Cardiac: RRR; 1/6 SM, no rubs, or gallops,no edema  Respiratory:  clear to auscultation bilaterally, normal work of breathing GI: soft, nontender, nondistended, + BS MS: no deformity or atrophy  Skin: warm and dry, no rash Neuro:  Alert and Oriented x 3, Strength and sensation are intact Psych: euthymic mood, full affect   ASSESSMENT:    1. Apical variant hypertrophic cardiomyopathy (HCC)    PLAN:    In order of problems listed above:  Apical hypertrophy/angina/chest tightness/shortness of breath with activity/abnormal EKG marked abnormality with T wave inversion diffusely, hypertrophic pattern -Cardiac CT - no CAD. Apical hypertrophy noted.  Explained perfusion gradient, possible cause for some chest discomfort when going up hills. -She has had no episodes of syncope, no early family history of sudden cardiac death.  No palpitations.    - Will check 24-hour Holter monitor in the future to look for any signs of adverse arrhythmias.  -We will go ahead and increase her Toprol to 50 mg.  Essential hypertension -Continue with amlodipine and Cozaar as well as increasing Toprol.   Blood pressure at home is usually in the 130s over 80s.  She has me if she would be on these 3 medicines long-term.  Potentially, if we had to pull off one medication, the amlodipine would be the first 1 to go.  I would like for her to continue the Toprol given her hypertrophic cardiomyopathy.  We will have her come back in 6 months to see APP, 12 months to see me.  Medication Adjustments/Labs and Tests Ordered: Current medicines are reviewed at length with the patient today.  Concerns regarding medicines are outlined above.  No orders of the defined types were placed in this encounter.  No orders of the defined types were placed in this encounter.   Signed, Candee Furbish, MD  10/17/2017 8:29 AM    [facility]

## 2017-10-17 NOTE — Patient Instructions (Signed)
Medication Instructions:  Please increase your Metoprolol ER to 50 mg a day. Continue all other medications as listed.  Your physician has recommended that you wear a holter monitor. Holter monitors are medical devices that record the heart's electrical activity. Doctors most often use these monitors to diagnose arrhythmias. Arrhythmias are problems with the speed or rhythm of the heartbeat. The monitor is a small, portable device. You can wear one while you do your normal daily activities. This is usually used to diagnose what is causing palpitations/syncope (passing out).  Follow-Up: Follow up in 6 months with Cecilie Kicks, PA.  You will receive a letter in the mail 2 months before you are due.  Please call us when you receive this letter to schedule your follow up appointment.  If you need a refill on your cardiac medications before your next appointment, please call your pharmacy.  Thank you for choosing [facility]!!

## 2017-10-18 MED FILL — METOPROLOL SUCCINATE ER 25: 25 | 30 days supply | Qty: 30 | Fill #1

## 2017-10-19 ENCOUNTER — Encounter: Payer: Self-pay | Admitting: Podiatry

## 2017-10-19 ENCOUNTER — Ambulatory Visit: Payer: BLUE CROSS/BLUE SHIELD | Admitting: Podiatry

## 2017-10-19 DIAGNOSIS — M2011 Hallux valgus (acquired), right foot: Secondary | ICD-10-CM | POA: Diagnosis not present

## 2017-10-19 DIAGNOSIS — M722 Plantar fascial fibromatosis: Secondary | ICD-10-CM | POA: Diagnosis not present

## 2017-10-19 DIAGNOSIS — M216X9 Other acquired deformities of unspecified foot: Secondary | ICD-10-CM

## 2017-10-19 DIAGNOSIS — M21619 Bunion of unspecified foot: Secondary | ICD-10-CM

## 2017-10-19 DIAGNOSIS — M2012 Hallux valgus (acquired), left foot: Secondary | ICD-10-CM

## 2017-10-19 NOTE — Patient Instructions (Signed)
Seen for bilateral heel pain. Noted of tight Achilles tendon, bilateral bunion, weak first metatarsal bone. Will benefit from a new pair orthotics and Equinus brace. Will request for prior authorization.

## 2017-10-19 NOTE — Progress Notes (Signed)
SUBJECTIVE: 67 y.o. year old female presents stating that she had bilateral heel pain for duration of 2 weeks. On feet not much at work but does up and down. Does walking exercise 3 days a week about 2 miles each time. Patient was seen at this office for the same problem 3 years ago and was treated with Custom orthotics and physical therapy.  Review of Systems  Constitutional: Negative for chills, fever and weight loss.  HENT: Negative.   Eyes: Negative.   Respiratory: Negative.   Cardiovascular: Negative.   Gastrointestinal: Negative.   Genitourinary: Negative.  Negative for frequency.  Musculoskeletal:       Bilateral knee pain.  Skin: Negative.      OBJECTIVE: DERMATOLOGIC EXAMINATION: Painful digital corn 5th bilateral R>L.   VASCULAR EXAMINATION OF LOWER LIMBS: All pedal pulses are palpable with normal pulsation.  Capillary Filling times within 3 seconds in all digits.  Temperature gradient from tibial crest to dorsum of foot is within normal bilateral.  NEUROLOGIC EXAMINATION OF THE LOWER LIMBS: All epicritic and tactile sensations grossly intact. Sharp and Dull discriminatory sensations at the plantar ball of hallux is intact bilateral.   MUSCULOSKELETAL EXAMINATION: High arched cavus type foot bilateral. Positive for Hallux valgus with bunion deformity bilateral. Tight Achilles tendon bilateral. Hammer toe deformity 5th bilateral. Pain at plantar heel bilateral.   ASSESSMENT: Plantar fasciitis bilateral. Ankle equinus bilateral. Hallux Valgus with Bunion bilateral. Hammer toe deformity with painful corn 5th bilateral R>L.  PLAN: Reviewed clinical findings and available treatment options. Reviewed stretch exercise to do daily. Painful corn debrided. Will benefit from a new pair orthotics (L3020) and Equinus Brace [phone], [id number]) Will request for coverage info on orthotics and brace.

## 2017-10-20 ENCOUNTER — Telehealth: Payer: Self-pay | Admitting: *Deleted

## 2017-10-20 NOTE — Telephone Encounter (Signed)
Benefits are as follows for CPT codes F3545,G2563,S9373: Patient has a $400 individual deductible which has been met. There is no prior authorization required for the CPT codes.The patient would be responsible for 15% coinsurance and the plan covers 80% of allowed amount. Price quoted to the patient for custom molded orthotics was $398 and for brace $400.  Patient scheduled for appointment next week  Reference # for the call is [id number]

## 2017-10-25 ENCOUNTER — Ambulatory Visit: Payer: BLUE CROSS/BLUE SHIELD | Admitting: Podiatry

## 2017-10-26 MED FILL — HYDROCODON-APAP 5-325: 5-325 | 5 days supply | Qty: 30 | Fill #0

## 2017-10-27 ENCOUNTER — Ambulatory Visit (INDEPENDENT_AMBULATORY_CARE_PROVIDER_SITE_OTHER): Payer: BLUE CROSS/BLUE SHIELD

## 2017-10-27 DIAGNOSIS — I422 Other hypertrophic cardiomyopathy: Secondary | ICD-10-CM | POA: Diagnosis not present

## 2017-11-01 ENCOUNTER — Encounter (HOSPITAL_BASED_OUTPATIENT_CLINIC_OR_DEPARTMENT_OTHER): Payer: Self-pay | Admitting: *Deleted

## 2017-11-02 ENCOUNTER — Encounter: Payer: Self-pay | Admitting: Podiatry

## 2017-11-02 ENCOUNTER — Ambulatory Visit: Payer: BLUE CROSS/BLUE SHIELD | Admitting: Podiatry

## 2017-11-02 DIAGNOSIS — M722 Plantar fascial fibromatosis: Secondary | ICD-10-CM | POA: Diagnosis not present

## 2017-11-02 DIAGNOSIS — M2012 Hallux valgus (acquired), left foot: Secondary | ICD-10-CM | POA: Diagnosis not present

## 2017-11-02 DIAGNOSIS — M21619 Bunion of unspecified foot: Secondary | ICD-10-CM

## 2017-11-02 DIAGNOSIS — M2011 Hallux valgus (acquired), right foot: Secondary | ICD-10-CM

## 2017-11-02 DIAGNOSIS — M216X9 Other acquired deformities of unspecified foot: Secondary | ICD-10-CM

## 2017-11-02 NOTE — Patient Instructions (Signed)
Fitted for Equinus brace with instruction. Both feet casted for Orthotics.

## 2017-11-02 NOTE — Progress Notes (Signed)
SUBJECTIVE: 67 y.o. year old female presents to fitted for Equinus brace and custom orthotics.   Her initial visit was for bilateral heel pain that started at the end of March.  On feet not much at work but does up and down. Does walking exercise 3 days a week about 2 miles each time. Patient was seen at this office for the same problem 3 years ago and was treated with Custom orthotics and physical therapy.   OBJECTIVE: DERMATOLOGIC EXAMINATION: Painful digital corn 5th bilateral R>L.   VASCULAR EXAMINATION OF LOWER LIMBS: All pedal pulses are palpable with normal pulsation.  Capillary Filling times within 3 seconds in all digits.  Temperature gradient from tibial crest to dorsum of foot is within normal bilateral.  NEUROLOGIC EXAMINATION OF THE LOWER LIMBS: All epicritic and tactile sensations grossly intact. Sharp and Dull discriminatory sensations at the plantar ball of hallux is intact bilateral.   MUSCULOSKELETAL EXAMINATION: High arched cavus type foot bilateral. Positive for Hallux valgus with bunion deformity bilateral. Tight Achilles tendon bilateral. Hammer toe deformity 5th bilateral. Pain at plantar heel bilateral.   ASSESSMENT: Plantar fasciitis bilateral. Ankle equinus bilateral. Hallux Valgus with Bunion bilateral. Hammer toe deformity with painful corn 5th bilateral R>L.  PLAN: Fitted for Equinus brace. Both feet casted for Orthotics. Continue with stretch exercise to do daily.  Equinus Brace Fitting and Dispensing Document: The plastic custom fitted Ankle Foot Orthosis was assembled in accordance with the manufacturer's instructions and based on the specific anatomical and physiological requirements of the patient. This device was custom fitted by me with expertise to provide this service and dispensed for the right foot. The patient was examined while wearing the device after several fitting maneuvers of trimming, bending, shaping, etc. and the fit  was found to be appropriate.    Medical necessity: Due to the pain in the foot/ankle/leg with weight bearing throughout the day, with diagnosis of equinus deformity and related symptoms, this is medically necessary for the treatment.  The function of this device is to serve as an anti contracture device of the Gastrocsoleal complex and to restrict and limit motion and help reduce excessive stress and strain to [employer] complex and foot/ankle/leg. It is being utilized to prevent the plantar contracture of the Gastrocsoleal complex.   The goal of the therapy are to:  1. Treat plantarflexion contracture of the ankle with dorsiflexion, the ankle on passive range of motion testing is noted to have at least 10 degrees (I.e, a non-fixed contracture). 2. Provide a reasonable expectation of the ability to correct the contracture.  3. Reduce the significantly with the beneficiary's functional abilities.  4. Used as a component of a therapy program which includes active stretching of the involved muscles and/or tendons. 5. Treat the beneficiary's plantar fasciitis. Additionally, medial and lateral ankle dorsiflex assistive and plantarflex restraint hinges are included on the brace and were set at 90 degrees of dorsiflexion. These will be periodically adjusted based on future physical examination of the patient's progress.  Instructions and Goals for the Patient: The device will be utilized for the next 8-12 weeks. The intent of these hinges is to resist plantarflexion and assist with dorsiflexion of the Gastrocsoleal complex and/or plantar fascia. These hinges will be adjusted over the course of the patient's therapy. The patient was instructed not to adjust the hinges. Patient was advised to bring the device to the office on next visit for further evaluation and adjustments.  The goals and function of this device was  explained in detail to the patient. The patient stated that the device was comfortable  when applied. The patient was shown and told in detail how to properly apply, remove, wear, and care for the device. The patient was able to apply and remove the device properly without assistance. Patient as advised not to ambulate with the device in place.  The device was then dispensed and was suitable for the condition and not substandard. No guarantees regarding resolution of symptoms were given and precautions were reviewed.  Written instructions and warranty information were given along with the list of the current [place] Standards.  The patient signed written proof of delivery. All questions were answered to the patient's satisfaction. The patent also given a follow up appointment in one month.

## 2017-11-06 ENCOUNTER — Ambulatory Visit: Payer: Self-pay | Admitting: Orthopedic Surgery

## 2017-11-06 ENCOUNTER — Encounter (HOSPITAL_BASED_OUTPATIENT_CLINIC_OR_DEPARTMENT_OTHER): Payer: Self-pay | Admitting: *Deleted

## 2017-11-07 ENCOUNTER — Other Ambulatory Visit: Payer: Self-pay

## 2017-11-07 ENCOUNTER — Encounter (HOSPITAL_BASED_OUTPATIENT_CLINIC_OR_DEPARTMENT_OTHER): Payer: Self-pay | Admitting: *Deleted

## 2017-11-07 NOTE — Progress Notes (Addendum)
SPOKE W/ PT VIA PHONE FOR PRE-OP INTERVIEW.  NPO AFTER MN W/ EXCEPTION CLEAR LIQUIDS UNTIL 0700 (NO CREAM [place] PRODUCTS).  ARRIVE AT 1100.  NEEDS ISTAT.  CURRENT IN CHART AND Epic.  WILL TAKE TOPROL AND NORVASC AM DOS W/ SIPS OF WATER.  ADDENDUM:  REVIEWED PT CHART FACE TO FACE W/ DR ELLENDER MDA.  STATED OK TO PROCEED.

## 2017-11-07 NOTE — H&P (View-Only) (Signed)
SPOKE W/ PT VIA PHONE FOR PRE-OP INTERVIEW.  NPO AFTER MN W/ EXCEPTION CLEAR LIQUIDS UNTIL 0700 (NO CREAM [place] PRODUCTS).  ARRIVE AT 1100.  NEEDS ISTAT.  CURRENT IN CHART AND Epic.  WILL TAKE TOPROL AND NORVASC AM DOS W/ SIPS OF WATER.  ADDENDUM:  REVIEWED PT CHART FACE TO FACE W/ DR ELLENDER MDA.  STATED OK TO PROCEED.

## 2017-11-09 ENCOUNTER — Ambulatory Visit (HOSPITAL_BASED_OUTPATIENT_CLINIC_OR_DEPARTMENT_OTHER): Payer: BLUE CROSS/BLUE SHIELD | Admitting: Anesthesiology

## 2017-11-09 ENCOUNTER — Ambulatory Visit (HOSPITAL_COMMUNITY): Payer: BLUE CROSS/BLUE SHIELD

## 2017-11-09 ENCOUNTER — Encounter (HOSPITAL_BASED_OUTPATIENT_CLINIC_OR_DEPARTMENT_OTHER)
Admission: RE | Disposition: A | Discharge status: Home / Self Care | Payer: Self-pay | Source: Ambulatory Visit | Attending: Specialist

## 2017-11-09 ENCOUNTER — Ambulatory Visit (HOSPITAL_BASED_OUTPATIENT_CLINIC_OR_DEPARTMENT_OTHER)
Admission: RE | Admit: 2017-11-09 | Discharge: 2017-11-09 | Disposition: A | Discharge status: Home / Self Care | Payer: BLUE CROSS/BLUE SHIELD | Source: Ambulatory Visit | Attending: Specialist | Admitting: Specialist

## 2017-11-09 ENCOUNTER — Encounter (HOSPITAL_BASED_OUTPATIENT_CLINIC_OR_DEPARTMENT_OTHER): Payer: Self-pay

## 2017-11-09 DIAGNOSIS — S83282A Other tear of lateral meniscus, current injury, left knee, initial encounter: Secondary | ICD-10-CM | POA: Insufficient documentation

## 2017-11-09 DIAGNOSIS — M2242 Chondromalacia patellae, left knee: Secondary | ICD-10-CM | POA: Diagnosis not present

## 2017-11-09 DIAGNOSIS — X58XXXA Exposure to other specified factors, initial encounter: Secondary | ICD-10-CM | POA: Insufficient documentation

## 2017-11-09 DIAGNOSIS — K219 Gastro-esophageal reflux disease without esophagitis: Secondary | ICD-10-CM | POA: Insufficient documentation

## 2017-11-09 DIAGNOSIS — M84462A Pathological fracture, left tibia, initial encounter for fracture: Secondary | ICD-10-CM | POA: Diagnosis not present

## 2017-11-09 DIAGNOSIS — Z79899 Other long term (current) drug therapy: Secondary | ICD-10-CM | POA: Insufficient documentation

## 2017-11-09 DIAGNOSIS — Z9889 Other specified postprocedural states: Secondary | ICD-10-CM

## 2017-11-09 DIAGNOSIS — J302 Other seasonal allergic rhinitis: Secondary | ICD-10-CM | POA: Insufficient documentation

## 2017-11-09 DIAGNOSIS — M1712 Unilateral primary osteoarthritis, left knee: Secondary | ICD-10-CM | POA: Insufficient documentation

## 2017-11-09 DIAGNOSIS — Z7951 Long term (current) use of inhaled steroids: Secondary | ICD-10-CM | POA: Diagnosis not present

## 2017-11-09 DIAGNOSIS — I422 Other hypertrophic cardiomyopathy: Secondary | ICD-10-CM | POA: Insufficient documentation

## 2017-11-09 DIAGNOSIS — I10 Essential (primary) hypertension: Secondary | ICD-10-CM | POA: Diagnosis not present

## 2017-11-09 DIAGNOSIS — S83242A Other tear of medial meniscus, current injury, left knee, initial encounter: Secondary | ICD-10-CM | POA: Diagnosis present

## 2017-11-09 HISTORY — DX: Other hypertrophic cardiomyopathy: I42.2

## 2017-11-09 HISTORY — DX: Other seasonal allergic rhinitis: J30.2

## 2017-11-09 HISTORY — DX: Shortness of breath: R06.02

## 2017-11-09 HISTORY — DX: Other vitreous opacities, bilateral: H43.393

## 2017-11-09 HISTORY — DX: Prediabetes: R73.03

## 2017-11-09 HISTORY — PX: KNEE ARTHROSCOPY WITH MEDIAL MENISECTOMY: SHX5651

## 2017-11-09 HISTORY — DX: Personal history of other infectious and parasitic diseases: Z86.19

## 2017-11-09 HISTORY — PX: CHONDROPLASTY: SHX5177

## 2017-11-09 HISTORY — DX: Other specified postprocedural states: Z98.890

## 2017-11-09 HISTORY — DX: Personal history of other benign neoplasm: Z86.018

## 2017-11-09 HISTORY — DX: Personal history of other diseases of the digestive system: Z87.19

## 2017-11-09 HISTORY — DX: Unspecified osteoarthritis, unspecified site: M19.90

## 2017-11-09 HISTORY — DX: Unspecified tear of unspecified meniscus, current injury, left knee, initial encounter: S83.207A

## 2017-11-09 LAB — POCT I-STAT 4, (NA,K, GLUC, HGB,HCT)
GLUCOSE: 85 mg/dL (ref 65–99)
HEMATOCRIT: 43 % (ref 36.0–46.0)
HEMOGLOBIN: 14.6 g/dL (ref 12.0–15.0)
Potassium: 4.2 mmol/L (ref 3.5–5.1)
Sodium: 143 mmol/L (ref 135–145)

## 2017-11-09 SURGERY — ARTHROSCOPY, KNEE, WITH MEDIAL MENISCECTOMY
Anesthesia: General | Laterality: Left

## 2017-11-09 MED ORDER — CEFAZOLIN SODIUM-DEXTROSE 2-4 GM/100ML-% IV SOLN
INTRAVENOUS | Status: AC
  Filled 2017-11-09: qty 100

## 2017-11-09 MED ORDER — CEPHALEXIN 500 MG PO CAPS: 500.0000 mg | ORAL_CAPSULE | Freq: Three times a day (TID) | ORAL | 0 refills | Status: DC

## 2017-11-09 MED ORDER — FENTANYL CITRATE (PF) 100 MCG/2ML IJ SOLN
INTRAMUSCULAR | Status: AC
  Filled 2017-11-09: qty 2

## 2017-11-09 MED ORDER — LIDOCAINE 2% (20 MG/ML) 5 ML SYRINGE
INTRAMUSCULAR | Status: DC | PRN
  Administered 2017-11-09: 60 mg via INTRAVENOUS

## 2017-11-09 MED ORDER — MORPHINE SULFATE (PF) 4 MG/ML IV SOLN
INTRAVENOUS | Status: DC | PRN
  Administered 2017-11-09: 4 mg via INTRAMUSCULAR

## 2017-11-09 MED ORDER — ROPIVACAINE HCL 7.5 MG/ML IJ SOLN
INTRAMUSCULAR | Status: DC | PRN
  Administered 2017-11-09: 20 mL via PERINEURAL

## 2017-11-09 MED ORDER — LACTATED RINGERS IV SOLN
INTRAVENOUS | Status: DC
  Administered 2017-11-09 (×2): via INTRAVENOUS
  Filled 2017-11-09: qty 1000

## 2017-11-09 MED ORDER — ACETAMINOPHEN 160 MG/5ML PO SOLN
325.0000 mg | ORAL | Status: DC | PRN
  Filled 2017-11-09: qty 20.3

## 2017-11-09 MED ORDER — ASPIRIN EC 325 MG PO TBEC: 325.0000 mg | DELAYED_RELEASE_TABLET | Freq: Two times a day (BID) | ORAL | 0 refills | Status: DC

## 2017-11-09 MED ORDER — ONDANSETRON HCL 4 MG/2ML IJ SOLN
INTRAMUSCULAR | Status: AC
  Filled 2017-11-09: qty 2

## 2017-11-09 MED ORDER — LIDOCAINE 2% (20 MG/ML) 5 ML SYRINGE
INTRAMUSCULAR | Status: AC
  Filled 2017-11-09: qty 5

## 2017-11-09 MED ORDER — PHENYLEPHRINE 40 MCG/ML (10ML) SYRINGE FOR IV PUSH (FOR BLOOD PRESSURE SUPPORT)
PREFILLED_SYRINGE | INTRAVENOUS | Status: DC | PRN
  Administered 2017-11-09: 40 ug via INTRAVENOUS

## 2017-11-09 MED ORDER — PROPOFOL 10 MG/ML IV BOLUS
INTRAVENOUS | Status: DC | PRN
  Administered 2017-11-09: 200 mg via INTRAVENOUS

## 2017-11-09 MED ORDER — DEXAMETHASONE SODIUM PHOSPHATE 10 MG/ML IJ SOLN
INTRAMUSCULAR | Status: DC | PRN
  Administered 2017-11-09: 10 mg via INTRAVENOUS

## 2017-11-09 MED ORDER — BUPIVACAINE HCL 0.25 % IJ SOLN
INTRAMUSCULAR | Status: DC | PRN
  Administered 2017-11-09: 30 mL

## 2017-11-09 MED ORDER — OXYCODONE HCL 5 MG PO TABS
ORAL_TABLET | ORAL | Status: AC
  Filled 2017-11-09: qty 1

## 2017-11-09 MED ORDER — OXYCODONE HCL 5 MG PO TABS: 5.0000 mg | ORAL_TABLET | ORAL | 0 refills | Status: AC | PRN

## 2017-11-09 MED ORDER — OXYCODONE HCL 5 MG PO TABS
5.0000 mg | ORAL_TABLET | Freq: Once | ORAL | Status: AC | PRN
  Administered 2017-11-09: 5 mg via ORAL
  Filled 2017-11-09: qty 1

## 2017-11-09 MED ORDER — ONDANSETRON HCL 4 MG/2ML IJ SOLN
INTRAMUSCULAR | Status: DC | PRN
  Administered 2017-11-09: 4 mg via INTRAVENOUS

## 2017-11-09 MED ORDER — EPHEDRINE 5 MG/ML INJ
INTRAVENOUS | Status: AC
  Filled 2017-11-09: qty 10

## 2017-11-09 MED ORDER — EPHEDRINE SULFATE-NACL 50-0.9 MG/10ML-% IV SOSY
PREFILLED_SYRINGE | INTRAVENOUS | Status: DC | PRN
  Administered 2017-11-09: 10 mg via INTRAVENOUS

## 2017-11-09 MED ORDER — MEPERIDINE HCL 25 MG/ML IJ SOLN
6.2500 mg | INTRAMUSCULAR | Status: DC | PRN
  Filled 2017-11-09: qty 1

## 2017-11-09 MED ORDER — DEXAMETHASONE SODIUM PHOSPHATE 10 MG/ML IJ SOLN
INTRAMUSCULAR | Status: AC
  Filled 2017-11-09: qty 1

## 2017-11-09 MED ORDER — MORPHINE SULFATE (PF) 4 MG/ML IV SOLN
INTRAVENOUS | Status: AC
Start: 2017-11-09 — End: ?
  Filled 2017-11-09: qty 1

## 2017-11-09 MED ORDER — FENTANYL CITRATE (PF) 100 MCG/2ML IJ SOLN
INTRAMUSCULAR | Status: DC | PRN
  Administered 2017-11-09 (×2): 50 ug via INTRAVENOUS

## 2017-11-09 MED ORDER — PHENYLEPHRINE HCL 10 MG/ML IJ SOLN: INTRAVENOUS | Status: DC | PRN

## 2017-11-09 MED ORDER — FENTANYL CITRATE (PF) 100 MCG/2ML IJ SOLN
100.0000 ug | Freq: Once | INTRAMUSCULAR | Status: AC
  Administered 2017-11-09: 100 ug via INTRAVENOUS
  Filled 2017-11-09: qty 2

## 2017-11-09 MED ORDER — MIDAZOLAM HCL 2 MG/2ML IJ SOLN
2.0000 mg | Freq: Once | INTRAMUSCULAR | Status: AC
  Administered 2017-11-09: 2 mg via INTRAVENOUS
  Filled 2017-11-09: qty 2

## 2017-11-09 MED ORDER — CHLORHEXIDINE GLUCONATE 4 % EX LIQD
60.0000 mL | Freq: Once | CUTANEOUS | Status: DC
  Filled 2017-11-09: qty 118

## 2017-11-09 MED ORDER — SODIUM CHLORIDE 0.9 % IR SOLN
Status: DC | PRN
  Administered 2017-11-09 (×2): 3000 mL

## 2017-11-09 MED ORDER — PROPOFOL 10 MG/ML IV BOLUS
INTRAVENOUS | Status: AC
  Filled 2017-11-09: qty 20

## 2017-11-09 MED ORDER — ACETAMINOPHEN 325 MG PO TABS
325.0000 mg | ORAL_TABLET | ORAL | Status: DC | PRN
  Filled 2017-11-09: qty 2

## 2017-11-09 MED ORDER — CEFAZOLIN SODIUM-DEXTROSE 2-4 GM/100ML-% IV SOLN
2.0000 g | INTRAVENOUS | Status: AC
  Administered 2017-11-09: 2 g via INTRAVENOUS
  Filled 2017-11-09: qty 100

## 2017-11-09 MED ORDER — ONDANSETRON HCL 4 MG/2ML IJ SOLN
4.0000 mg | Freq: Once | INTRAMUSCULAR | Status: DC | PRN
  Filled 2017-11-09: qty 2

## 2017-11-09 MED ORDER — FENTANYL CITRATE (PF) 100 MCG/2ML IJ SOLN
25.0000 ug | INTRAMUSCULAR | Status: DC | PRN
  Administered 2017-11-09: 50 ug via INTRAVENOUS
  Filled 2017-11-09: qty 1

## 2017-11-09 MED ORDER — MIDAZOLAM HCL 2 MG/2ML IJ SOLN
INTRAMUSCULAR | Status: AC
  Filled 2017-11-09: qty 2

## 2017-11-09 MED ORDER — TRIAMCINOLONE ACETONIDE 40 MG/ML IJ SUSP
INTRAMUSCULAR | Status: DC | PRN
  Administered 2017-11-09: 40 mg via INTRA_ARTICULAR

## 2017-11-09 MED ORDER — OXYCODONE HCL 5 MG/5ML PO SOLN
5.0000 mg | Freq: Once | ORAL | Status: AC | PRN
  Filled 2017-11-09: qty 5

## 2017-11-09 MED ORDER — PHENYLEPHRINE 40 MCG/ML (10ML) SYRINGE FOR IV PUSH (FOR BLOOD PRESSURE SUPPORT)
PREFILLED_SYRINGE | INTRAVENOUS | Status: AC
  Filled 2017-11-09: qty 10

## 2017-11-09 MED FILL — ASPIRIN EC 325 MG TABLET: 325 | 30 days supply | Qty: 60 | Fill #0

## 2017-11-09 MED FILL — CEPHALEXIN 500 MG CAPSULE: 500 | 4 days supply | Qty: 12 | Fill #0

## 2017-11-09 MED FILL — oxyCODONE HCL 5 MG TABS: 5 | 6 days supply | Qty: 40 | Fill #0

## 2017-11-09 SURGICAL SUPPLY — 43 items
ACCUMIX ×2 IMPLANT
BANDAGE ESMARK 6X9 LF (GAUZE/BANDAGES/DRESSINGS) ×1 IMPLANT
BLADE CUDA GRT WHITE 3.5 (BLADE) ×3 IMPLANT
BNDG CMPR 9X6 STRL LF SNTH (GAUZE/BANDAGES/DRESSINGS) ×1
BNDG ESMARK 6X9 LF (GAUZE/BANDAGES/DRESSINGS) ×3
BNDG GAUZE ELAST 4 BULKY (GAUZE/BANDAGES/DRESSINGS) ×3 IMPLANT
CUFF TOURN SGL QUICK 24 (TOURNIQUET CUFF) ×3
CUFF TRNQT CYL 24X4X40X1 (TOURNIQUET CUFF) ×1 IMPLANT
DRAPE ARTHROSCOPY W/POUCH 114 (DRAPES) ×3 IMPLANT
DRAPE C-ARM 42X72 X-RAY (DRAPES) ×3 IMPLANT
DRAPE INCISE IOBAN 66X45 STRL (DRAPES) ×3 IMPLANT
DRAPE LG THREE QUARTER DISP (DRAPES) ×3 IMPLANT
DRAPE U-SHAPE 47X51 STRL (DRAPES) ×3 IMPLANT
DRSG PAD ABDOMINAL 8X10 ST (GAUZE/BANDAGES/DRESSINGS) ×2 IMPLANT
DURAPREP 26ML APPLICATOR (WOUND CARE) ×3 IMPLANT
ELECT MENISCUS 165MM 90D (ELECTRODE) ×2 IMPLANT
GAUZE SPONGE 4X4 12PLY STRL (GAUZE/BANDAGES/DRESSINGS) ×3 IMPLANT
GAUZE XEROFORM 1X8 LF (GAUZE/BANDAGES/DRESSINGS) ×3 IMPLANT
GLOVE BIO SURGEON STRL SZ7.5 (GLOVE) ×3 IMPLANT
GLOVE BIO SURGEON STRL SZ8 (GLOVE) ×3 IMPLANT
GLOVE INDICATOR 8.0 STRL GRN (GLOVE) ×6 IMPLANT
GOWN STRL REUS W/TWL LRG LVL3 (GOWN DISPOSABLE) IMPLANT
GOWN STRL REUS W/TWL XL LVL3 (GOWN DISPOSABLE) ×6 IMPLANT
IV NS IRRIG 3000ML ARTHROMATIC (IV SOLUTION) ×6 IMPLANT
KIT MIXER ACCUMIX (KITS) ×2 IMPLANT
KIT TURNOVER CYSTO (KITS) ×3 IMPLANT
KNEE KIT SCP W/SIDE ACCUPORT (Joint) ×2 IMPLANT
KNEE WRAP E Z 3 GEL PACK (MISCELLANEOUS) ×3 IMPLANT
MANIFOLD NEPTUNE II (INSTRUMENTS) ×3 IMPLANT
NEEDLE HYPO 22GX1.5 SAFETY (NEEDLE) IMPLANT
PACK ARTHROSCOPY DSU (CUSTOM PROCEDURE TRAY) ×3 IMPLANT
PACK BASIN DAY SURGERY FS (CUSTOM PROCEDURE TRAY) ×3 IMPLANT
PAD ABD 8X10 STRL (GAUZE/BANDAGES/DRESSINGS) ×3 IMPLANT
PAD ARMBOARD 7.5X6 YLW CONV (MISCELLANEOUS) IMPLANT
PROBE BIPOLAR 50 DEGREE SUCT (MISCELLANEOUS) ×2 IMPLANT
SET ARTHROSCOPY TUBING (MISCELLANEOUS) ×3
SET ARTHROSCOPY TUBING LN (MISCELLANEOUS) ×1 IMPLANT
SUT ETHILON 4 0 PS 2 18 (SUTURE) ×3 IMPLANT
SYR CONTROL 10ML LL (SYRINGE) ×3 IMPLANT
TOWEL OR 17X24 6PK STRL BLUE (TOWEL DISPOSABLE) ×3 IMPLANT
TUBE CONNECTING 12'X1/4 (SUCTIONS)
TUBE CONNECTING 12X1/4 (SUCTIONS) IMPLANT
WATER STERILE IRR 500ML POUR (IV SOLUTION) ×3 IMPLANT

## 2017-11-09 NOTE — Interval H&P Note (Signed)
History and Physical Interval Note:  11/09/2017 1:56 PM  [name]  has presented today for surgery, with the diagnosis of Left knee medial meniscal tear, bone marrow edema medial tibial plateau  The various methods of treatment have been discussed with the patient and family. After consideration of risks, benefits and other options for treatment, the patient has consented to  Procedure(s): LEFT KNEE ARTHROSCOPY, DEBRIDEMENT WITH PARTIAL MEDIAL AND LATERAL MENISECTOMY, CHONDROPLASTY, ARTHROSCOPIC ASSISTED INTERNAL FIXATION MEDIAL TIBIAL PLATEAU FRACTURE (Left) CHONDROPLASTY (Left) as a surgical intervention .  The patient's history has been reviewed, patient examined, no change in status, stable for surgery.  I have reviewed the patient's chart and labs.  Questions were answered to the patient's satisfaction.     Carlisa Eble ANDREW

## 2017-11-09 NOTE — Anesthesia Procedure Notes (Signed)
Procedure Name: LMA Insertion Date/Time: 11/09/2017 3:24 PM Performed by: Gwyndolyn Saxon, CRNA Pre-anesthesia Checklist: Patient identified, Emergency Drugs available, Suction available, Patient being monitored and Timeout performed Patient Re-evaluated:Patient Re-evaluated prior to induction Oxygen Delivery Method: Circle system utilized Preoxygenation: Pre-oxygenation with 100% oxygen Induction Type: IV induction Ventilation: Mask ventilation without difficulty LMA: LMA inserted LMA Size: 4.0 Number of attempts: 1 Placement Confirmation: positive ETCO2,  CO2 detector and breath sounds checked- equal and bilateral Tube secured with: Tape Dental Injury: Teeth and Oropharynx as per pre-operative assessment

## 2017-11-09 NOTE — Progress Notes (Signed)
1355 time out for block with Dr. Ambrose Pancoast and Nurse. Versed 2mg  and Fentanyal 100 mcg IV given for sedation at 1357. Tolerated block well. Block completed at 1405.

## 2017-11-09 NOTE — Transfer of Care (Signed)
Immediate Anesthesia Transfer of Care Note  Patient: Wylodean Shimmel  Procedure(s) Performed: LEFT KNEE ARTHROSCOPY, DEBRIDEMENT WITH PARTIAL MEDIAL AND LATERAL MENISECTOMY, CHONDROPLASTY, ARTHROSCOPIC ASSISTED INTERNAL FIXATION MEDIAL TIBIAL PLATEAU FRACTURE (Left ) CHONDROPLASTY (Left )  Patient Location: PACU  Anesthesia Type:General  Level of Consciousness: drowsy  Airway & Oxygen Therapy: Patient Spontanous Breathing and Patient connected to face mask oxygen  Post-op Assessment: Report given to RN and Post -op Vital signs reviewed and stable  Post vital signs: Reviewed and stable  Last Vitals:  Vitals Value Taken Time  BP 116/64 11/09/2017  4:21 PM  Temp 36.3 C 11/09/2017  4:21 PM  Pulse 73 11/09/2017  4:24 PM  Resp 15 11/09/2017  4:24 PM  SpO2 95 % 11/09/2017  4:24 PM  Vitals shown include unvalidated device data.  Last Pain:  Vitals:   11/09/17 1133  TempSrc:   PainSc: 0-No pain      Patients Stated Pain Goal: 8 (58/30/94 0768)  Complications: No apparent anesthesia complications

## 2017-11-09 NOTE — Anesthesia Postprocedure Evaluation (Signed)
Anesthesia Post Note  Patient: Anna [name]  Procedure(s) Performed: LEFT KNEE ARTHROSCOPY, DEBRIDEMENT WITH PARTIAL MEDIAL AND LATERAL MENISECTOMY, CHONDROPLASTY, ARTHROSCOPIC ASSISTED INTERNAL FIXATION MEDIAL TIBIAL PLATEAU FRACTURE (Left ) CHONDROPLASTY (Left )     Patient location during evaluation: PACU Anesthesia Type: General and Regional Level of consciousness: awake Pain management: pain level controlled Vital Signs Assessment: post-procedure vital signs reviewed and stable Respiratory status: spontaneous breathing, nonlabored ventilation, respiratory function stable and patient connected to nasal cannula oxygen Cardiovascular status: blood pressure returned to baseline and stable Postop Assessment: no apparent nausea or vomiting Anesthetic complications: no    Last Vitals:  Vitals:   11/09/17 1715 11/09/17 1815  BP: 139/78 140/72  Pulse: (!) 59 62  Resp: 16 14  Temp:  (!) 36.3 C  SpO2: 94% 96%    Last Pain:  Vitals:   11/09/17 1815  TempSrc:   PainSc: 5                  Ryan P Ellender

## 2017-11-09 NOTE — Op Note (Signed)
[phone]

## 2017-11-09 NOTE — Anesthesia Procedure Notes (Signed)
Anesthesia Regional Block: Adductor canal block   Pre-Anesthetic Checklist: ,, timeout performed, Correct Patient, Correct Site, Correct Laterality, Correct Procedure, Correct Position, site marked, Risks and benefits discussed,  Surgical consent,  Pre-op evaluation,  At surgeon's request and post-op pain management  Laterality: Left  Prep: chloraprep       Needles:  Injection technique: Single-shot  Needle Type: Echogenic Stimulator Needle     Needle Length: 5cm  Needle Gauge: 22     Additional Needles:   Procedures:, nerve stimulator,,, ultrasound used (permanent image in chart),,,,  Narrative:  Start time: 11/09/2017 2:03 PM End time: 11/09/2017 2:07 PM Injection made incrementally with aspirations every 5 mL.  Performed by: Personally  Anesthesiologist: Janeece Riggers, MD  Additional Notes: Functioning IV was confirmed and monitors were applied.  A 61mm 22ga Arrow echogenic stimulator needle was used. Sterile prep and drape,hand hygiene and sterile gloves were used. Ultrasound guidance: relevant anatomy identified, needle position confirmed, local anesthetic spread visualized around nerve(s)., vascular puncture avoided.  Image printed for medical record. Negative aspiration and negative test dose prior to incremental administration of local anesthetic. The patient tolerated the procedure well.

## 2017-11-09 NOTE — H&P (Signed)
Anna [name] is an 67 y.o. female.   Chief Complaint: left knee pain HPI: Patient presents with joint discomfort that had been persistent for several weeks now. Despite conservative treatments, the patients discomfort has not improved. Recent history of a PM and chondroplasty. Imaging was obtained. Other conservative and surgical treatments were discussed in detail. Patient wishes to proceed with surgery as consented. Denies SOB, CP, or calf pain. No Fever, chills, or nausea/ vomiting.   Past Medical History:  Diagnosis Date  . Acute meniscal tear of left knee   . Apical variant hypertrophic cardiomyopathy ([place]) no episodes of syncope, no family hx sudden cardiac death, and no palpitations:  holter monitor 10-27-2017 showed PVC"s/ PAC's   cardiologist-  dr Marlou Porch--- per lov note, dated 10-17-2017-- Cardiac CT (08-24-2017) calcium 0, normal coronary orgin with right dominance, no evidence CAD, there is severe LVH predominantly in the apical segments that are obliterating LV cavity, no apical aneurysm is seen:  symptomatic with chest tightness and sob with activity/  . Atypical chest pain    Negative nuclear stress test , done 03-17-2006,  referr to cardiologist notes by dr Marlou Porch in epic  . Family history of adverse reaction to anesthesia    pts sister - severe PONV  . Floaters in visual field, bilateral   . Hemorrhoids, internal    and external  . Hiatal hernia   . History of hepatitis C    dx 01/ 2017--  followed by infectious disease, dr comer,  Genotype 1b, F2/3 per elastography-- pt completed harvoni treatment 05/ 2017, now cured;  last [place] abd/ elastography 08-24-2016 F0/1  . History of uterine fibroid   . Hypertension   . OA (osteoarthritis)    left knee, hands  . Pre-diabetes   . S/P dilatation of esophageal stricture   . Seasonal allergic rhinitis   . SOB (shortness of breath) on exertion   . Vitamin D deficiency     Past Surgical History:  Procedure Laterality Date  .  ABDOMINAL HYSTERECTOMY  1994   w/ Unilateral Salpingoophorectomy   . APPENDECTOMY  age 36  . CARDIOVASCULAR STRESS TEST  03/17/2006   normal adenosine nuclear study , no exercise/ no evidence ischemia/  normal LV function and wall motion , ef 75%  . CHOLECYSTECTOMY N/A 02/04/2016   Procedure: LAPAROSCOPIC CHOLECYSTECTOMY ;  Surgeon: Armandina Gemma, MD;  Location: [facility];  Service: General;  Laterality: N/A;  . COLONOSCOPY WITH ESOPHAGOGASTRODUODENOSCOPY (EGD)  last one 08-19-2015  dr Henrene Pastor  . DILATION AND CURETTAGE OF UTERUS  1973  . DOBUTAMINE STRESS ECHO  03-05-2010   dr Stanford Breed   normal stress echo w/ normal LVEF   . KNEE ARTHROSCOPY Left 12/2016  . TRANSTHORACIC ECHOCARDIOGRAM  06/15/2017    dr Marlou Porch   mild LVH, ef 01-60%, grade 1 diastolic dysfunction/  mild LAE/  aneurysmal interatrial septum-- cannot exclude PFO/  trivial TR/ impressions: there is severe hypertrophy of the mid and apical segments highly suspicious for an apical form of hypertrophic cardiomyopathy    Family History  Problem Relation Age of Onset  . Hypertension Mother   . Parkinsonism Mother   . Arthritis Mother        OA  . Prostate cancer Father   . Hypertension Sister   . Hypertension Brother   . Prostate cancer Brother   . Hypertension Maternal Grandmother   . Hypertension Sister   . Hypertension Sister   . Heart attack Sister 18  smoker  . Diabetes Neg Hx   . Stroke Neg Hx   . Colon cancer Neg Hx   . Stomach cancer Neg Hx    Social History:  reports that she has never smoked. She has never used smokeless tobacco. She reports that she drinks alcohol. She reports that she does not use drugs.  Allergies:  Allergies  Allergen Reactions  . Tramadol     SEVERE HEADACHES , dizzy, nausea    Medications Prior to Admission  Medication Sig Dispense Refill  . amLODipine (NORVASC) 5 MG tablet Take 1 tablet (5 mg total) by mouth daily. (Patient taking differently: Take 5 mg by mouth every morning. ) 90  tablet 3  . fluticasone (FLONASE) 50 MCG/ACT nasal spray Place 1 spray into both nostrils daily as needed.   3  . HYDROcodone-acetaminophen (NORCO/VICODIN) 5-325 MG tablet Take 1 tablet by mouth every 6 (six) hours as needed for moderate pain.    Marland Kitchen losartan (COZAAR) 100 MG tablet Take 1 tablet (100 mg total) by mouth daily. (Patient taking differently: Take 100 mg by mouth every morning. ) 90 tablet 3  . metoprolol succinate (TOPROL-XL) 50 MG 24 hr tablet Take 1 tablet (50 mg total) by mouth daily. (Patient taking differently: Take 50 mg by mouth every morning. ) 90 tablet 3    Results for orders placed or performed during the hospital encounter of 11/09/17 (from the past 48 hour(s))  I-STAT 4, (NA,K, GLUC, HGB,HCT)     Status: None   Collection Time: 11/09/17 11:49 AM  Result Value Ref Range   Sodium 143 135 - 145 mmol/L   Potassium 4.2 3.5 - 5.1 mmol/L   Glucose, Bld 85 65 - 99 mg/dL   HCT 43.0 36.0 - 46.0 %   Hemoglobin 14.6 12.0 - 15.0 g/dL   No results found.  Review of Systems  Constitutional: Negative.   HENT: Negative.   Eyes: Negative.   Respiratory: Negative.   Cardiovascular: Negative.   Gastrointestinal: Negative.   Genitourinary: Negative.   Musculoskeletal: Positive for joint pain.  Skin: Negative.   Neurological: Negative.   Endo/Heme/Allergies: Negative.   Psychiatric/Behavioral: Negative.     Blood pressure (!) 157/73, pulse (!) 58, temperature 98.5 F (36.9 C), temperature source Oral, resp. rate 16, height 5\' 2"  (1.575 m), weight 71.1 kg (156 lb 11.2 oz), SpO2 100 %. Physical Exam  Constitutional: She is oriented to person, place, and time. She appears well-developed.  HENT:  Head: Normocephalic.  Eyes: EOM are normal.  Neck: Normal range of motion.  Cardiovascular: Intact distal pulses.  Respiratory: Effort normal.  GI: Soft.  Genitourinary:  Genitourinary Comments: Deferred  Musculoskeletal:  Left knee pain. Good ROM. Knee is stable.   Neurological: She is alert and oriented to person, place, and time.  Skin: Skin is warm and dry.  Psychiatric: Her behavior is normal.     Assessment/Plan IF of MTP: Scope as consented D/c home with family Follow instructions ASA at D/C. F/u in office  Lajean Manes, PA-C 11/09/2017, 1:41 PM

## 2017-11-09 NOTE — Anesthesia Preprocedure Evaluation (Signed)
Anesthesia Evaluation  Patient identified by MRN, date of birth, ID band Patient awake    Reviewed: Allergy & Precautions, NPO status , Patient's Chart, lab work & pertinent test results  History of Anesthesia Complications (+) Family history of anesthesia reaction and history of anesthetic complications  Airway Mallampati: III  TM Distance: >3 FB Neck ROM: Full    Dental  (+) Teeth Intact, Dental Advisory Given   Pulmonary neg pulmonary ROS,    Pulmonary exam normal breath sounds clear to auscultation       Cardiovascular hypertension, Pt. on medications and Pt. on home beta blockers Normal cardiovascular exam Rhythm:Regular Rate:Normal  10-17-2017-- Cardiac CT (08-24-2017) calcium 0, normal coronary orgin with right dominance, no evidence CAD, there is severe LVH predominantly in the apical segments that are obliterating LV cavity, no apical aneurysm is seen   Neuro/Psych  Neuromuscular disease negative psych ROS   GI/Hepatic GERD  Medicated and Controlled,(+) Hepatitis -, C  Endo/Other  negative endocrine ROSObesity   Renal/GU negative Renal ROS     Musculoskeletal  (+) Arthritis , Osteoarthritis,    Abdominal   Peds  Hematology negative hematology ROS (+)   Anesthesia Other Findings   Reproductive/Obstetrics                             Anesthesia Physical  Anesthesia Plan  ASA: III  Anesthesia Plan: General   Post-op Pain Management: GA combined w/ Regional for post-op pain   Induction: Intravenous  PONV Risk Score and Plan: 2 and Ondansetron and Treatment may vary due to age or medical condition  Airway Management Planned: LMA  Additional Equipment:   Intra-op Plan:   Post-operative Plan: Extubation in OR  Informed Consent: I have reviewed the patients History and Physical, chart, labs and discussed the procedure including the risks, benefits and alternatives for the  proposed anesthesia with the patient or authorized representative who has indicated his/her understanding and acceptance.   Dental advisory given  Plan Discussed with: CRNA, Anesthesiologist and Surgeon  Anesthesia Plan Comments:         Anesthesia Quick Evaluation

## 2017-11-09 NOTE — Discharge Instructions (Signed)

## 2017-11-10 ENCOUNTER — Encounter (HOSPITAL_BASED_OUTPATIENT_CLINIC_OR_DEPARTMENT_OTHER): Payer: Self-pay | Admitting: Specialist

## 2017-11-10 NOTE — Op Note (Signed)
NAME:  Anna [name], Anna [name] NO.:  [id number]  MEDICAL RECORD NO.:  [id number]  LOCATION:                                 FACILITY:  PHYSICIAN:  Cynda Familia, M.D. DATE OF BIRTH:  DATE OF PROCEDURE:  11/09/2017 DATE OF DISCHARGE:                              OPERATIVE REPORT   PREOPERATIVE DIAGNOSES: 1. Left knee torn medial meniscus. 2. Osteoarthritis. 3. Insufficiency fracture, medial tibial plateau with bone marrow     edema.  POSTOPERATIVE DIAGNOSES: 1. Left knee radial tear, lateral meniscus. 2. Osteoarthritis grade 3 and 4 medial femoral condyle. 3. Insufficiency fracture, medial tibial plateau, bone marrow edema.  PROCEDURE: 1. Left knee arthroscopic partial meniscectomy and chondroplasty. 2. Arthroscopic-assisted internal fixation, medial tibial plateau     insufficiency fracture.  SURGEON:  Cynda Familia, M.D.  ASSISTANT:  Wyatt Portela, PA-C.  ANESTHESIA:  Adductor canal block with general.  ESTIMATED BLOOD LOSS:  Minimal.  DRAINS:  None.  COMPLICATION:  None.  TOURNIQUET TIME:  30 minutes at 300 mmHg.  DISPOSITION:  PACU, stable.  PROCEDURE IN DETAIL:  The patient and family were counseled in the holding area.  Correct site was identified, marked and signed appropriately.  IV was started.  Antibiotics were given.  Block was administered.  Taken to the operating room, placed in position under general anesthesia.  TED hose applied on uninvolved leg.  Left lower extremity was elevated and draped in sterile fashion.  Time-out done from left side, exsanguinated with an Esmarch.  Tourniquet inflated to 300 mmHg.  Arthroscopic portal was established at proximal medial, inferomedial, inferolateral trying to use previous portal sites as well as possible.  Patellofemoral joint revealed small chondral fragments that were irrigated from the knee.  Mild chondromalacia of the patella, did not require chondroplasty.  Femoral trochlea  remarkable. Suprapatellar pouch, medial and lateral gutters unremarkable.  ACL and PCL intact.  Lateral side inspected.  Two radial tears in lateral meniscus mid one-third utilizing cautery system.  Small lateral meniscal tissue was removed.  Lateral femoral condyle and lateral tibial plateau unremarkable.  Medial side inspected.  Medial meniscus did not have an arthroscopic tear.  This was an internal signal on the MRI scan, and there was no tear of the medial meniscus, but there was grade 4 chondromalacia which was unstable at periphery grade 3.  Utilizing shavers, I am going to shave and chondroplasty.  At this time, the C-arm was brought in perfect circle technique, the __________ tibial insufficiency fracture is located in AP and lateral planes.  Comparing this to MRI scan.  Trocar was then placed into the __________ pathology and sequentially turned 360 degrees with the AccuFill bone paste filling this individually.  At the correct amount pressure, re-arthroscoped the knee.  There was no extravasation of AccuFill into the knee.  There were little bit soft tissues, there was no __________.  We had excellent fill of the insufficiency fracture region 360 degrees circumferentially. Arthroscopic equipment was removed as was the cannula.  No complications or problems.  The patient was then awakened.  Portal was closed with 4-0 nylon suture.  Another 10 mL of Sensorcaine placed in skin and  knee joint.  Sterile dressing applied to the knee.  Tourniquet deflated. Dressings were applied.  Ice pack and awakened, taken from the operating room to the PACU in stable condition.  She will be stabilized in the PACU and discharged to home.  To help pack to help with patient positioning, prepping, draping, technical and surgical assistance throughout the entire case, wound closure, raising C-arm images, Mr. Wyatt Portela, PA-C's assistance was needed.    ______________________________ Cynda Familia, M.D.   ______________________________ Cynda Familia, M.D.    RAC/MEDQ  D:  11/09/2017  T:  11/10/2017  Job:  [id number]

## 2017-11-20 NOTE — Telephone Encounter (Signed)
Left vm orthotics are ready for pick up.

## 2017-11-22 ENCOUNTER — Encounter: Payer: Self-pay | Admitting: Physical Therapy

## 2017-11-22 ENCOUNTER — Ambulatory Visit: Payer: BLUE CROSS/BLUE SHIELD | Attending: Specialist | Admitting: Physical Therapy

## 2017-11-22 ENCOUNTER — Other Ambulatory Visit: Payer: Self-pay

## 2017-11-22 DIAGNOSIS — M6281 Muscle weakness (generalized): Secondary | ICD-10-CM | POA: Diagnosis present

## 2017-11-22 DIAGNOSIS — M25662 Stiffness of left knee, not elsewhere classified: Secondary | ICD-10-CM | POA: Diagnosis present

## 2017-11-22 DIAGNOSIS — M25562 Pain in left knee: Secondary | ICD-10-CM | POA: Diagnosis present

## 2017-11-22 DIAGNOSIS — R262 Difficulty in walking, not elsewhere classified: Secondary | ICD-10-CM | POA: Diagnosis present

## 2017-11-22 DIAGNOSIS — R2689 Other abnormalities of gait and mobility: Secondary | ICD-10-CM | POA: Insufficient documentation

## 2017-11-22 NOTE — Telephone Encounter (Signed)
Patient picked up orthotics. Will schedule in a month for f/u
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
## 2017-11-23 ENCOUNTER — Ambulatory Visit: Payer: BLUE CROSS/BLUE SHIELD | Admitting: Physical Therapy

## 2017-11-23 ENCOUNTER — Encounter: Payer: Self-pay | Admitting: Physical Therapy

## 2017-11-23 DIAGNOSIS — M25662 Stiffness of left knee, not elsewhere classified: Secondary | ICD-10-CM

## 2017-11-23 DIAGNOSIS — M25562 Pain in left knee: Secondary | ICD-10-CM

## 2017-11-23 DIAGNOSIS — M6281 Muscle weakness (generalized): Secondary | ICD-10-CM

## 2017-11-23 DIAGNOSIS — R2689 Other abnormalities of gait and mobility: Secondary | ICD-10-CM

## 2017-11-23 NOTE — Therapy (Signed)
[facility] [address]  [place], [place], [address] Phone: [phone]   Fax:  [phone]  Physical Therapy Treatment  Patient Details  Name: Anna [name] MRN: [id number] Date of Birth: [birth year] Referring Provider: Jerilynn Mages, PA-C   Encounter Date: 11/23/2017  PT End of Session - 11/23/17 1454    Visit Number  2    Number of Visits  8    Date for PT Re-Evaluation  12/20/17    Authorization Type  BCBS    PT Start Time  1354    PT Stop Time  1450    PT Time Calculation (min)  56 min    Activity Tolerance  Patient tolerated treatment well    Behavior During Therapy  [facility] for tasks assessed/performed       Past Medical History:  Diagnosis Date  . Acute meniscal tear of left knee   . Apical variant hypertrophic cardiomyopathy ([place]) no episodes of syncope, no family hx sudden cardiac death, and no palpitations:  holter monitor 10-27-2017 showed PVC"s/ PAC's   cardiologist-  dr Marlou Porch--- per lov note, dated 10-17-2017-- Cardiac CT (08-24-2017) calcium 0, normal coronary orgin with right dominance, no evidence CAD, there is severe LVH predominantly in the apical segments that are obliterating LV cavity, no apical aneurysm is seen:  symptomatic with chest tightness and sob with activity/  . Atypical chest pain    Negative nuclear stress test , done 03-17-2006,  referr to cardiologist notes by dr Marlou Porch in epic  . Family history of adverse reaction to anesthesia    pts sister - severe PONV  . Floaters in visual field, bilateral   . Hemorrhoids, internal    and external  . Hiatal hernia   . History of hepatitis C    dx 01/ 2017--  followed by infectious disease, dr comer,  Genotype 1b, F2/3 per elastography-- pt completed harvoni treatment 05/ 2017, now cured;  last [place] abd/ elastography 08-24-2016 F0/1  . History of uterine fibroid   . Hypertension   . OA (osteoarthritis)    left knee, hands  . Pre-diabetes    . S/P dilatation of esophageal stricture   . Seasonal allergic rhinitis   . SOB (shortness of breath) on exertion   . Vitamin D deficiency     Past Surgical History:  Procedure Laterality Date  . ABDOMINAL HYSTERECTOMY  1994   w/ Unilateral Salpingoophorectomy   . APPENDECTOMY  age 67  . CARDIOVASCULAR STRESS TEST  03/17/2006   normal adenosine nuclear study , no exercise/ no evidence ischemia/  normal LV function and wall motion , ef 75%  . CHOLECYSTECTOMY N/A 02/04/2016   Procedure: LAPAROSCOPIC CHOLECYSTECTOMY ;  Surgeon: Armandina Gemma, MD;  Location: [facility];  Service: General;  Laterality: N/A;  . CHONDROPLASTY Left 11/09/2017   Procedure: CHONDROPLASTY;  Surgeon: Sydnee Cabal, MD;  Location: [facility];  Service: Orthopedics;  Laterality: Left;  . COLONOSCOPY WITH ESOPHAGOGASTRODUODENOSCOPY (EGD)  last one 08-19-2015  dr Henrene Pastor  . DILATION AND CURETTAGE OF UTERUS  1973  . DOBUTAMINE STRESS ECHO  03-05-2010   dr Stanford Breed   normal stress echo w/ normal LVEF   . KNEE ARTHROSCOPY Left 12/2016  . KNEE ARTHROSCOPY WITH MEDIAL MENISECTOMY Left 11/09/2017   Procedure: LEFT KNEE ARTHROSCOPY, DEBRIDEMENT WITH PARTIAL MEDIAL AND LATERAL MENISECTOMY, CHONDROPLASTY, ARTHROSCOPIC ASSISTED INTERNAL FIXATION MEDIAL TIBIAL PLATEAU FRACTURE;  Surgeon: Sydnee Cabal, MD;  Location: [facility];  Service: Orthopedics;  Laterality: Left;  . TRANSTHORACIC ECHOCARDIOGRAM  06/15/2017    dr Marlou Porch   mild LVH, ef 17-40%, grade 1 diastolic dysfunction/  mild LAE/  aneurysmal interatrial septum-- cannot exclude PFO/  trivial TR/ impressions: there is severe hypertrophy of the mid and apical segments highly suspicious for an apical form of hypertrophic cardiomyopathy    There were no vitals filed for this visit.  Subjective Assessment - 11/23/17 1359    Subjective  Patient reports yesterday she had a busy day and was exhausted. Came to PT, went to [place], and got  her hair done which was a busy day on her feet for her. Had some L knee swelling and used some ice which helped.    Pertinent History  2 prior L knee surgeries, HTN, hypertrophic cardiomyopathy    Patient Stated Goals  get back to work, recreational walking, be able to dance    Currently in Pain?  No/denies    Pain Score  0-No pain                       [facility] Adult PT Treatment/Exercise - 11/23/17 1402      Knee/Hip Exercises: Stretches   Quad Stretch  2 reps;Limitations;30 seconds    Quad Stretch Limitations  prone; with PT overpressure    Gastroc Stretch  2 reps;20 seconds;Both;Limitations    Press photographer Limitations  against wall      Knee/Hip Exercises: Aerobic   Nustep  L3x6 min      Knee/Hip Exercises: Standing   Lateral Step Up  10 reps;Both;Step Height: 6"    Lateral Step Up Limitations  CGA for safety; pt reports mild patellofemoral pain    Forward Step Up  10 reps;Left;Limitations;Step Height: 6"    Forward Step Up Limitations  CGA for safety    Wall Squat  10 reps;Limitations      Knee/Hip Exercises: Supine   Heel Slides  AROM;15 reps;Left    Bridges with Diona Foley Squeeze  Strengthening;15 reps;Limitations    Bridges with Clamshell  Strengthening;15 reps red TB around thighs; VC/TC to avoid knee collapse      Knee/Hip Exercises: Sidelying   Hip ABduction  Strengthening;15 reps;Both;Limitations    Hip ABduction Limitations  Tactile cues to keep trunk from rotating back      Modalities   Modalities  Vasopneumatic      Vasopneumatic   Number Minutes Vasopneumatic   10 minutes    Vasopnuematic Location   Knee    Vasopneumatic Pressure  Medium    Vasopneumatic Temperature   Coldest Temp.              PT Education - 11/22/17 1027    Education provided  Yes    Education Details  prognosis, POC, HEP    Person(s) Educated  Patient    Methods  Explanation;Demonstration;Tactile cues;Handout    Comprehension  Verbalized understanding;Returned  demonstration       PT Short Term Goals - 11/22/17 1040      PT SHORT TERM GOAL #1   Title  Patient to be independent with initial HEP (01/19/17)    Time  2    Period  Weeks    Status  New    Target Date  12/06/17        PT Long Term Goals - 11/22/17 1041      PT LONG TERM GOAL #1   Title  Patient to be independent with advanced HEP (02/16/17)  Time  4    Period  Weeks    Status  New    Target Date  12/20/17      PT LONG TERM GOAL #2   Title  Patient to improve L LE strength to >/= 4+/5     Time  4    Period  Weeks    Status  New    Target Date  12/20/17      PT LONG TERM GOAL #3   Title  Patient to demonstrate sit<>stand transfers without provocation of knee pain.    Time  4    Period  Weeks    Status  New    Target Date  12/20/17      PT LONG TERM GOAL #4   Title  Patient to demonstrate ability to ascend/descend 15 steps reciprocally without evidence of instability.    Time  4    Period  Weeks    Status  New    Target Date  12/20/17            Plan - 11/23/17 1442    Clinical Impression Statement  Patient arrived to clinic with report that she had a busy day on her feet all day yesterday. Reported mild L knee swelling that was decreased after using ice at home. Upon examination, L medial knee slightly edematous but no more than observed at last session. Patient tolerated progressive quad and glute strengthening this session. Reported mild L knee pain during prone quad stretch which decreased after easing off slightly. Performed anterior/lateral step ups with good quad control and CGA for balance.Tolerated [place] to L knee; normal integumentary response at end of sesison.     PT Treatment/Interventions  Cryotherapy;Electrical Stimulation;Iontophoresis 4mg /ml Dexamethasone;Moist Heat;Therapeutic exercise;Therapeutic activities;Functional mobility training;Stair training;Gait training;Ultrasound;Balance training;Neuromuscular re-education;Patient/family  education;Manual techniques;Vasopneumatic Device;Taping;Dry needling;Passive range of motion;Scar mobilization    PT Next Visit Plan  Continue quad stretching/knee PROM, anterior/lateral step ups, assess stairs in stairwell     Consulted and Agree with Plan of Care  Patient       Patient will benefit from skilled therapeutic intervention in order to improve the following deficits and impairments:  Abnormal gait, Decreased endurance, Decreased activity tolerance, Decreased strength, Pain, Decreased mobility, Difficulty walking, Decreased range of motion  Visit Diagnosis: Acute pain of left knee  Stiffness of left knee, not elsewhere classified  Other abnormalities of gait and mobility  Muscle weakness (generalized)     Problem List Patient Active Problem List   Diagnosis Date Noted  . S/P arthroscopy of left knee 11/09/2017  . Apical variant hypertrophic cardiomyopathy ([place]) 10/17/2017  . Vitamin D deficiency   . Tinnitus   . Rhinitis   . Hypertension   . Hiatal hernia   . Hemorrhoids, internal   . Hematuria, microscopic   . GERD (gastroesophageal reflux disease)   . Floaters   . Family history of adverse reaction to anesthesia   . Esophageal stricture   . Dysrhythmia   . Dry eyes   . Arthritis   . Atypical chest pain   . Pre-diabetes 03/29/2016  . Cholelithiasis with chronic cholecystitis 02/04/2016  . Chronic cholecystitis with calculus 02/02/2016  . Esophagitis 09/03/2015  . Liver fibrosis 09/03/2015  . History of hepatitis C- cured 2017 07/22/2015  . Hepatitis C 06/18/2015  . B12 deficiency 11/07/2014  . Plantar fasciitis of right foot 08/13/2014  . Deformity of metatarsal bone of right foot 08/13/2014  . Dyspnea 02/08/2013  . HSV antigen DIF positive 02/09/2011  .  HIATAL HERNIA 07/13/2010  . NONSPEC ELEVATION OF LEVELS OF TRANSAMINASE/LDH 04/16/2010  . ESOPHAGEAL STRICTURE 02/05/2010  . RHINITIS 09/25/2008  . CHOLELITHIASIS 08/05/2008  . Essential  hypertension 04/05/2007  . Nonspecific abnormal electrocardiogram (ECG) (EKG) 03/13/2007   Janene Harvey, PT, DPT 11/23/17 3:02 PM   [facility] [address]  [place], [place], [address] Phone: [phone]   Fax:  [phone]  Name: Lari Linson MRN: [id number] Date of Birth: Jun 05, 1951

## 2017-11-28 ENCOUNTER — Ambulatory Visit: Payer: BLUE CROSS/BLUE SHIELD

## 2017-11-28 DIAGNOSIS — M25562 Pain in left knee: Secondary | ICD-10-CM | POA: Diagnosis not present

## 2017-11-28 DIAGNOSIS — M25662 Stiffness of left knee, not elsewhere classified: Secondary | ICD-10-CM

## 2017-11-28 DIAGNOSIS — M6281 Muscle weakness (generalized): Secondary | ICD-10-CM

## 2017-11-28 DIAGNOSIS — R2689 Other abnormalities of gait and mobility: Secondary | ICD-10-CM

## 2017-11-28 NOTE — Therapy (Signed)
[facility] [address]  [place], [place], [address] Phone: [phone]   Fax:  [phone]  Physical Therapy Treatment  Patient Details  Name: Anna [name] MRN: [id number] Date of Birth: 03-29-1951 Referring Provider: Jerilynn Mages, PA-C   Encounter Date: 11/28/2017  PT End of Session - 11/28/17 1410    Visit Number  3    Number of Visits  8    Date for PT Re-Evaluation  12/20/17    Authorization Type  BCBS    PT Start Time  1402    PT Stop Time  1448    PT Time Calculation (min)  46 min    Activity Tolerance  Patient tolerated treatment well    Behavior During Therapy  [facility] for tasks assessed/performed       Past Medical History:  Diagnosis Date  . Acute meniscal tear of left knee   . Apical variant hypertrophic cardiomyopathy ([place]) no episodes of syncope, no family hx sudden cardiac death, and no palpitations:  holter monitor 10-27-2017 showed PVC"s/ PAC's   cardiologist-  dr Marlou Porch--- per lov note, dated 10-17-2017-- Cardiac CT (08-24-2017) calcium 0, normal coronary orgin with right dominance, no evidence CAD, there is severe LVH predominantly in the apical segments that are obliterating LV cavity, no apical aneurysm is seen:  symptomatic with chest tightness and sob with activity/  . Atypical chest pain    Negative nuclear stress test , done 03-17-2006,  referr to cardiologist notes by dr Marlou Porch in epic  . Family history of adverse reaction to anesthesia    pts sister - severe PONV  . Floaters in visual field, bilateral   . Hemorrhoids, internal    and external  . Hiatal hernia   . History of hepatitis C    dx 01/ 2017--  followed by infectious disease, dr comer,  Genotype 1b, F2/3 per elastography-- pt completed harvoni treatment 05/ 2017, now cured;  last [place] abd/ elastography 08-24-2016 F0/1  . History of uterine fibroid   . Hypertension   . OA (osteoarthritis)    left knee, hands  . Pre-diabetes    . S/P dilatation of esophageal stricture   . Seasonal allergic rhinitis   . SOB (shortness of breath) on exertion   . Vitamin D deficiency     Past Surgical History:  Procedure Laterality Date  . ABDOMINAL HYSTERECTOMY  1994   w/ Unilateral Salpingoophorectomy   . APPENDECTOMY  age 73  . CARDIOVASCULAR STRESS TEST  03/17/2006   normal adenosine nuclear study , no exercise/ no evidence ischemia/  normal LV function and wall motion , ef 75%  . CHOLECYSTECTOMY N/A 02/04/2016   Procedure: LAPAROSCOPIC CHOLECYSTECTOMY ;  Surgeon: Armandina Gemma, MD;  Location: [facility];  Service: General;  Laterality: N/A;  . CHONDROPLASTY Left 11/09/2017   Procedure: CHONDROPLASTY;  Surgeon: Sydnee Cabal, MD;  Location: [facility];  Service: Orthopedics;  Laterality: Left;  . COLONOSCOPY WITH ESOPHAGOGASTRODUODENOSCOPY (EGD)  last one 08-19-2015  dr Henrene Pastor  . DILATION AND CURETTAGE OF UTERUS  1973  . DOBUTAMINE STRESS ECHO  03-05-2010   dr Stanford Breed   normal stress echo w/ normal LVEF   . KNEE ARTHROSCOPY Left 12/2016  . KNEE ARTHROSCOPY WITH MEDIAL MENISECTOMY Left 11/09/2017   Procedure: LEFT KNEE ARTHROSCOPY, DEBRIDEMENT WITH PARTIAL MEDIAL AND LATERAL MENISECTOMY, CHONDROPLASTY, ARTHROSCOPIC ASSISTED INTERNAL FIXATION MEDIAL TIBIAL PLATEAU FRACTURE;  Surgeon: Sydnee Cabal, MD;  Location: [facility];  Service: Orthopedics;  Laterality: Left;  . TRANSTHORACIC ECHOCARDIOGRAM  06/15/2017    dr Marlou Porch   mild LVH, ef 51-88%, grade 1 diastolic dysfunction/  mild LAE/  aneurysmal interatrial septum-- cannot exclude PFO/  trivial TR/ impressions: there is severe hypertrophy of the mid and apical segments highly suspicious for an apical form of hypertrophic cardiomyopathy    There were no vitals filed for this visit.  Subjective Assessment - 11/28/17 1408    Subjective  Pt. reporting some L medial knee soreness while walking today however reports she was pain free while visiting  grandchildren in [place] over weekend.      Pertinent History  2 prior L knee surgeries, HTN, hypertrophic cardiomyopathy    Patient Stated Goals  get back to work, recreational walking, be able to dance    Currently in Pain?  No/denies    Pain Score  0-No pain    Multiple Pain Sites  No                       [facility] Adult PT Treatment/Exercise - 11/28/17 1413      Ambulation/Gait   Stairs  Yes    Stairs Assistance  6: Modified independent (Device/Increase time)    Stair Management Technique  One rail Right;Alternating pattern    Number of Stairs  14 2 flights     Height of Stairs  8    Door Management  6: Modified independent (Device/Increase time)    Gait Comments  Pt. able to ascend/descend stairs x 14 with reciprocal pattern and one rail use with mild L medial knee pain on ascending however only limited L quad instability on descending      Knee/Hip Exercises: Stretches   Control and instrumentation engineer reps;30 seconds    Quad Stretch Limitations  prone with strap and bolster under anterior thigh     Hip Flexor Stretch  Left;1 rep;30 seconds;Limitations    Hip Flexor Stretch Limitations  in mod thomas position       Knee/Hip Exercises: Aerobic   Recumbent Bike  Lvl 1, 6 min       Knee/Hip Exercises: Standing   Terminal Knee Extension  Left;15 reps;Theraband;Strengthening Cues required for proper positioning    Theraband Level (Terminal Knee Extension)  Level 4 (Blue)    Lateral Step Up  10 reps;Both;Step Height: 8";Hand Hold: 0    Lateral Step Up Limitations  intermittent UE support     Forward Step Up  10 reps;Left;Hand Hold: 1;Step Height: 8"    Forward Step Up Limitations  red TB TKE       Knee/Hip Exercises: Seated   Hamstring Curl  Left;10 reps;Strengthening    Hamstring Limitations  Green TB      Knee/Hip Exercises: Supine   Bridges with Clamshell  Both;10 reps;Strengthening with hip abd/ER isometrics into green TB at knees     Straight Leg Raises  Left;10 reps     Straight Leg Raises Limitations  2#    Straight Leg Raise with External Rotation  Left;10 reps    Straight Leg Raise with External Rotation Limitations  2#      Knee/Hip Exercises: Sidelying   Clams  L clam shell with red TB at knees x 10 reps       Manual Therapy   Manual Therapy  Joint mobilization    Manual therapy comments  supine     Joint Mobilization  L patellar mobs all directions; somewhat limited mobility inferior/superior  PT Education - 11/28/17 1508    Education provided  Yes    Education Details  HEP update     Person(s) Educated  Patient    Methods  Explanation;Demonstration;Verbal cues;Handout    Comprehension  Verbalized understanding;Returned demonstration;Verbal cues required;Need further instruction       PT Short Term Goals - 11/28/17 1411      PT SHORT TERM GOAL #1   Title  Patient to be independent with initial HEP (01/19/17)    Time  2    Period  Weeks    Status  On-going        PT Long Term Goals - 11/28/17 1427      PT LONG TERM GOAL #1   Title  Patient to be independent with advanced HEP (02/16/17)    Time  4    Period  Weeks    Status  On-going      PT LONG TERM GOAL #2   Title  Patient to improve L LE strength to >/= 4+/5     Time  4    Period  Weeks    Status  On-going      PT LONG TERM GOAL #3   Title  Patient to demonstrate sit<>stand transfers without provocation of knee pain.    Time  4    Period  Weeks    Status  On-going      PT LONG TERM GOAL #4   Title  Patient to demonstrate ability to ascend/descend 15 steps reciprocally without evidence of instability.    Time  4    Period  Weeks    Status  On-going            Plan - 11/28/17 1427    Clinical Impression Statement  Anna [name] doing well today.  Noted some L medial knee pain today while walking however has been pain free over weekend while visiting family in [place].  Pt. demonstrating slight L quad instability with eccentric lowering while  descending today however able to ascend/descend reciprocally with good overall stability.  Strengthening therex today focusing on quad/VMO strengthening with good tolerance.  Some noted quad tightness today thus HEP updated appropriately.  Anna [name] treatment pain free thus modalities deferred.  Will monitor response to today progression in strengthening therex in tomorrow's visit.      PT Treatment/Interventions  Cryotherapy;Electrical Stimulation;Iontophoresis 4mg /ml Dexamethasone;Moist Heat;Therapeutic exercise;Therapeutic activities;Functional mobility training;Stair training;Gait training;Ultrasound;Balance training;Neuromuscular re-education;Patient/family education;Manual techniques;Vasopneumatic Device;Taping;Dry needling;Passive range of motion;Scar mobilization    Consulted and Agree with Plan of Care  Patient       Patient will benefit from skilled therapeutic intervention in order to improve the following deficits and impairments:  Abnormal gait, Decreased endurance, Decreased activity tolerance, Decreased strength, Pain, Decreased mobility, Difficulty walking, Decreased range of motion  Visit Diagnosis: Acute pain of left knee  Stiffness of left knee, not elsewhere classified  Other abnormalities of gait and mobility  Muscle weakness (generalized)     Problem List Patient Active Problem List   Diagnosis Date Noted  . S/P arthroscopy of left knee 11/09/2017  . Apical variant hypertrophic cardiomyopathy ([place]) 10/17/2017  . Vitamin D deficiency   . Tinnitus   . Rhinitis   . Hypertension   . Hiatal hernia   . Hemorrhoids, internal   . Hematuria, microscopic   . GERD (gastroesophageal reflux disease)   . Floaters   . Family history of adverse reaction to anesthesia   . Esophageal stricture   . Dysrhythmia   .  Dry eyes   . Arthritis   . Atypical chest pain   . Pre-diabetes 03/29/2016  . Cholelithiasis with chronic cholecystitis 02/04/2016  . Chronic cholecystitis  with calculus 02/02/2016  . Esophagitis 09/03/2015  . Liver fibrosis 09/03/2015  . History of hepatitis C- cured 2017 07/22/2015  . Hepatitis C 06/18/2015  . B12 deficiency 11/07/2014  . Plantar fasciitis of right foot 08/13/2014  . Deformity of metatarsal bone of right foot 08/13/2014  . Dyspnea 02/08/2013  . HSV antigen DIF positive 02/09/2011  . HIATAL HERNIA 07/13/2010  . NONSPEC ELEVATION OF LEVELS OF TRANSAMINASE/LDH 04/16/2010  . ESOPHAGEAL STRICTURE 02/05/2010  . RHINITIS 09/25/2008  . CHOLELITHIASIS 08/05/2008  . Essential hypertension 04/05/2007  . Nonspecific abnormal electrocardiogram (ECG) (EKG) 03/13/2007    Bess Harvest, PTA 11/28/17 3:10 PM  [facility] [address]  [place], [place], [address] Phone: [phone]   Fax:  [phone]  Name: Geana Walts MRN: [id number] Date of Birth: 06/21/1951

## 2017-11-29 ENCOUNTER — Ambulatory Visit: Payer: BLUE CROSS/BLUE SHIELD | Admitting: Physical Therapy

## 2017-11-29 ENCOUNTER — Encounter: Payer: Self-pay | Admitting: Physical Therapy

## 2017-11-29 DIAGNOSIS — R2689 Other abnormalities of gait and mobility: Secondary | ICD-10-CM

## 2017-11-29 DIAGNOSIS — M6281 Muscle weakness (generalized): Secondary | ICD-10-CM

## 2017-11-29 DIAGNOSIS — M25662 Stiffness of left knee, not elsewhere classified: Secondary | ICD-10-CM

## 2017-11-29 DIAGNOSIS — M25562 Pain in left knee: Secondary | ICD-10-CM | POA: Diagnosis not present

## 2017-11-29 NOTE — Therapy (Signed)
[facility] [address]  [place], [place], [address] Phone: [phone]   Fax:  [phone]  Physical Therapy Treatment  Patient Details  Name: Anna [name] MRN: [id number] Date of Birth: 10-28-1950 Referring Provider: Jerilynn Mages, PA-C   Encounter Date: 11/29/2017  PT End of Session - 11/29/17 0855    Visit Number  4    Number of Visits  8    Date for PT Re-Evaluation  12/20/17    Authorization Type  BCBS    PT Start Time  0803    PT Stop Time  0854    PT Time Calculation (min)  51 min    Activity Tolerance  Patient tolerated treatment well    Behavior During Therapy  [facility] for tasks assessed/performed       Past Medical History:  Diagnosis Date  . Acute meniscal tear of left knee   . Apical variant hypertrophic cardiomyopathy ([place]) no episodes of syncope, no family hx sudden cardiac death, and no palpitations:  holter monitor 10-27-2017 showed PVC"s/ PAC's   cardiologist-  dr Marlou Porch--- per lov note, dated 10-17-2017-- Cardiac CT (08-24-2017) calcium 0, normal coronary orgin with right dominance, no evidence CAD, there is severe LVH predominantly in the apical segments that are obliterating LV cavity, no apical aneurysm is seen:  symptomatic with chest tightness and sob with activity/  . Atypical chest pain    Negative nuclear stress test , done 03-17-2006,  referr to cardiologist notes by dr Marlou Porch in epic  . Family history of adverse reaction to anesthesia    pts sister - severe PONV  . Floaters in visual field, bilateral   . Hemorrhoids, internal    and external  . Hiatal hernia   . History of hepatitis C    dx 01/ 2017--  followed by infectious disease, dr comer,  Genotype 1b, F2/3 per elastography-- pt completed harvoni treatment 05/ 2017, now cured;  last [place] abd/ elastography 08-24-2016 F0/1  . History of uterine fibroid   . Hypertension   . OA (osteoarthritis)    left knee, hands  . Pre-diabetes    . S/P dilatation of esophageal stricture   . Seasonal allergic rhinitis   . SOB (shortness of breath) on exertion   . Vitamin D deficiency     Past Surgical History:  Procedure Laterality Date  . ABDOMINAL HYSTERECTOMY  1994   w/ Unilateral Salpingoophorectomy   . APPENDECTOMY  age 50  . CARDIOVASCULAR STRESS TEST  03/17/2006   normal adenosine nuclear study , no exercise/ no evidence ischemia/  normal LV function and wall motion , ef 75%  . CHOLECYSTECTOMY N/A 02/04/2016   Procedure: LAPAROSCOPIC CHOLECYSTECTOMY ;  Surgeon: Armandina Gemma, MD;  Location: [facility];  Service: General;  Laterality: N/A;  . CHONDROPLASTY Left 11/09/2017   Procedure: CHONDROPLASTY;  Surgeon: Sydnee Cabal, MD;  Location: [facility];  Service: Orthopedics;  Laterality: Left;  . COLONOSCOPY WITH ESOPHAGOGASTRODUODENOSCOPY (EGD)  last one 08-19-2015  dr Henrene Pastor  . DILATION AND CURETTAGE OF UTERUS  1973  . DOBUTAMINE STRESS ECHO  03-05-2010   dr Stanford Breed   normal stress echo w/ normal LVEF   . KNEE ARTHROSCOPY Left 12/2016  . KNEE ARTHROSCOPY WITH MEDIAL MENISECTOMY Left 11/09/2017   Procedure: LEFT KNEE ARTHROSCOPY, DEBRIDEMENT WITH PARTIAL MEDIAL AND LATERAL MENISECTOMY, CHONDROPLASTY, ARTHROSCOPIC ASSISTED INTERNAL FIXATION MEDIAL TIBIAL PLATEAU FRACTURE;  Surgeon: Sydnee Cabal, MD;  Location: [facility];  Service: Orthopedics;  Laterality: Left;  . TRANSTHORACIC ECHOCARDIOGRAM  06/15/2017    dr Marlou Porch   mild LVH, ef 47-09%, grade 1 diastolic dysfunction/  mild LAE/  aneurysmal interatrial septum-- cannot exclude PFO/  trivial TR/ impressions: there is severe hypertrophy of the mid and apical segments highly suspicious for an apical form of hypertrophic cardiomyopathy    There were no vitals filed for this visit.  Subjective Assessment - 11/29/17 0806    Subjective  Patient reports medial knee pain started yesterday before her PT appointment. Reports she only has pain when she  walks, up to 7/10. Has not increased her activity recently. Goes back to work tomorrow.    Pertinent History  2 prior L knee surgeries, HTN, hypertrophic cardiomyopathy    Patient Stated Goals  get back to work, recreational walking, be able to dance    Currently in Pain?  No/denies                       [facility] Adult PT Treatment/Exercise - 11/29/17 0810      Knee/Hip Exercises: Stretches   Active Hamstring Stretch  Both;2 reps;30 seconds;Limitations    Active Hamstring Stretch Limitations  supine with hands behind knee and ankle DF    Hip Flexor Stretch  2 reps;20 seconds;Both    Hip Flexor Stretch Limitations  thomas test position      Knee/Hip Exercises: Aerobic   Recumbent Bike  Lvl 2, 6 min       Knee/Hip Exercises: Standing   Other Standing Knee Exercises  HS curls with UE support on chair; L 2x15 to tolerance      Knee/Hip Exercises: Supine   Bridges  Strengthening;2 sets;10 reps;Limitations    Bridges Limitations  Feet together, hips in ER    Straight Leg Raises  Left;10 reps    Straight Leg Raises Limitations  2#    Straight Leg Raise with External Rotation  Left;10 reps    Straight Leg Raise with External Rotation Limitations  2#      Vasopneumatic   Number Minutes Vasopneumatic   10 minutes    Vasopnuematic Location   Knee    Vasopneumatic Pressure  Medium    Vasopneumatic Temperature   Coldest Temp.       Manual Therapy   Manual Therapy  Joint mobilization    Manual therapy comments  supine     Joint Mobilization  L patellar mobs all directions; somewhat limited mobility inferior/superior  L medial knee TTP with mild warmth             PT Education - 11/28/17 1508    Education provided  Yes    Education Details  HEP update     Person(s) Educated  Patient    Methods  Explanation;Demonstration;Verbal cues;Handout    Comprehension  Verbalized understanding;Returned demonstration;Verbal cues required;Need further instruction       PT  Short Term Goals - 11/28/17 1411      PT SHORT TERM GOAL #1   Title  Patient to be independent with initial HEP (01/19/17)    Time  2    Period  Weeks    Status  On-going        PT Long Term Goals - 11/28/17 1427      PT LONG TERM GOAL #1   Title  Patient to be independent with advanced HEP (02/16/17)    Time  4    Period  Weeks    Status  On-going  PT LONG TERM GOAL #2   Title  Patient to improve L LE strength to >/= 4+/5     Time  4    Period  Weeks    Status  On-going      PT LONG TERM GOAL #3   Title  Patient to demonstrate sit<>stand transfers without provocation of knee pain.    Time  4    Period  Weeks    Status  On-going      PT LONG TERM GOAL #4   Title  Patient to demonstrate ability to ascend/descend 15 steps reciprocally without evidence of instability.    Time  4    Period  Weeks    Status  On-going            Plan - 11/29/17 0817    Clinical Impression Statement   Patient arrived to session reporting increased L medial knee pain before yesterday's PT session; no worse after the session. Reports pain increases with walking up to 7/10 at its worst and makes her feel like she needs to limp. When questioned, said that the pain feels similar to her pain before the surgery. Reports no increased activity level recently, did not walk much on it over the weekend. L medial knee is slightly warm and edematous. Advised patient to use crutches if pain worsens and to contact MD if pain worsens much more. Opted for light LE stretching and strengthening this session as not to inflame L knee. Received Gameready to L knee; normal integumentary response at end of session. Patient with mild antalgic gait pattern upon leaving clinic. Will plan to notify MD of patient's symptoms.    PT Treatment/Interventions  Cryotherapy;Electrical Stimulation;Iontophoresis 4mg /ml Dexamethasone;Moist Heat;Therapeutic exercise;Therapeutic activities;Functional mobility training;Stair  training;Gait training;Ultrasound;Balance training;Neuromuscular re-education;Patient/family education;Manual techniques;Vasopneumatic Device;Taping;Dry needling;Passive range of motion;Scar mobilization    Consulted and Agree with Plan of Care  Patient       Patient will benefit from skilled therapeutic intervention in order to improve the following deficits and impairments:  Abnormal gait, Decreased endurance, Decreased activity tolerance, Decreased strength, Pain, Decreased mobility, Difficulty walking, Decreased range of motion  Visit Diagnosis: Acute pain of left knee  Stiffness of left knee, not elsewhere classified  Other abnormalities of gait and mobility  Muscle weakness (generalized)     Problem List Patient Active Problem List   Diagnosis Date Noted  . S/P arthroscopy of left knee 11/09/2017  . Apical variant hypertrophic cardiomyopathy ([place]) 10/17/2017  . Vitamin D deficiency   . Tinnitus   . Rhinitis   . Hypertension   . Hiatal hernia   . Hemorrhoids, internal   . Hematuria, microscopic   . GERD (gastroesophageal reflux disease)   . Floaters   . Family history of adverse reaction to anesthesia   . Esophageal stricture   . Dysrhythmia   . Dry eyes   . Arthritis   . Atypical chest pain   . Pre-diabetes 03/29/2016  . Cholelithiasis with chronic cholecystitis 02/04/2016  . Chronic cholecystitis with calculus 02/02/2016  . Esophagitis 09/03/2015  . Liver fibrosis 09/03/2015  . History of hepatitis C- cured 2017 07/22/2015  . Hepatitis C 06/18/2015  . B12 deficiency 11/07/2014  . Plantar fasciitis of right foot 08/13/2014  . Deformity of metatarsal bone of right foot 08/13/2014  . Dyspnea 02/08/2013  . HSV antigen DIF positive 02/09/2011  . HIATAL HERNIA 07/13/2010  . NONSPEC ELEVATION OF LEVELS OF TRANSAMINASE/LDH 04/16/2010  . ESOPHAGEAL STRICTURE 02/05/2010  . RHINITIS 09/25/2008  .  CHOLELITHIASIS 08/05/2008  . Essential hypertension 04/05/2007  .  Nonspecific abnormal electrocardiogram (ECG) (EKG) 03/13/2007   Janene Harvey, PT, DPT 11/29/17 9:00 AM  [facility] [address]  [place], [place], [address] Phone: [phone]   Fax:  [phone]  Name: Anna [name] MRN: [id number] Date of Birth: 09/16/1950

## 2017-11-30 ENCOUNTER — Encounter: Payer: BLUE CROSS/BLUE SHIELD | Admitting: Physical Therapy

## 2017-12-01 MED FILL — METHYLPREDNISOLONE 4 MG TAB: 4 | 6 days supply | Qty: 21 | Fill #0

## 2017-12-04 ENCOUNTER — Encounter: Payer: Self-pay | Admitting: Physical Therapy

## 2017-12-04 ENCOUNTER — Ambulatory Visit: Payer: BLUE CROSS/BLUE SHIELD | Admitting: Physical Therapy

## 2017-12-04 DIAGNOSIS — M6281 Muscle weakness (generalized): Secondary | ICD-10-CM

## 2017-12-04 DIAGNOSIS — M25562 Pain in left knee: Secondary | ICD-10-CM

## 2017-12-04 DIAGNOSIS — R2689 Other abnormalities of gait and mobility: Secondary | ICD-10-CM

## 2017-12-04 DIAGNOSIS — M25662 Stiffness of left knee, not elsewhere classified: Secondary | ICD-10-CM

## 2017-12-04 DIAGNOSIS — R262 Difficulty in walking, not elsewhere classified: Secondary | ICD-10-CM

## 2017-12-04 NOTE — Therapy (Signed)
[facility] [address]  [place], [place], [address] Phone: [phone]   Fax:  [phone]  Physical Therapy Treatment  Patient Details  Name: Anna [name] MRN: [id number] Date of Birth: 1951/06/27 Referring Provider: Jerilynn Mages, PA-C   Encounter Date: 12/04/2017  PT End of Session - 12/04/17 1659    Visit Number  5    Number of Visits  8    Date for PT Re-Evaluation  12/20/17    Authorization Type  BCBS    PT Start Time  1618    PT Stop Time  6962    PT Time Calculation (min)  57 min    Activity Tolerance  Patient tolerated treatment well    Behavior During Therapy  [facility] for tasks assessed/performed       Past Medical History:  Diagnosis Date  . Acute meniscal tear of left knee   . Apical variant hypertrophic cardiomyopathy ([place]) no episodes of syncope, no family hx sudden cardiac death, and no palpitations:  holter monitor 10-27-2017 showed PVC"s/ PAC's   cardiologist-  dr Marlou Porch--- per lov note, dated 10-17-2017-- Cardiac CT (08-24-2017) calcium 0, normal coronary orgin with right dominance, no evidence CAD, there is severe LVH predominantly in the apical segments that are obliterating LV cavity, no apical aneurysm is seen:  symptomatic with chest tightness and sob with activity/  . Atypical chest pain    Negative nuclear stress test , done 03-17-2006,  referr to cardiologist notes by dr Marlou Porch in epic  . Family history of adverse reaction to anesthesia    pts sister - severe PONV  . Floaters in visual field, bilateral   . Hemorrhoids, internal    and external  . Hiatal hernia   . History of hepatitis C    dx 01/ 2017--  followed by infectious disease, dr comer,  Genotype 1b, F2/3 per elastography-- pt completed harvoni treatment 05/ 2017, now cured;  last [place] abd/ elastography 08-24-2016 F0/1  . History of uterine fibroid   . Hypertension   . OA (osteoarthritis)    left knee, hands  . Pre-diabetes    . S/P dilatation of esophageal stricture   . Seasonal allergic rhinitis   . SOB (shortness of breath) on exertion   . Vitamin D deficiency     Past Surgical History:  Procedure Laterality Date  . ABDOMINAL HYSTERECTOMY  1994   w/ Unilateral Salpingoophorectomy   . APPENDECTOMY  age 60  . CARDIOVASCULAR STRESS TEST  03/17/2006   normal adenosine nuclear study , no exercise/ no evidence ischemia/  normal LV function and wall motion , ef 75%  . CHOLECYSTECTOMY N/A 02/04/2016   Procedure: LAPAROSCOPIC CHOLECYSTECTOMY ;  Surgeon: Armandina Gemma, MD;  Location: [facility];  Service: General;  Laterality: N/A;  . CHONDROPLASTY Left 11/09/2017   Procedure: CHONDROPLASTY;  Surgeon: Sydnee Cabal, MD;  Location[facility];  Service: Orthopedics;  Laterality: Left;  . COLONOSCOPY WITH ESOPHAGOGASTRODUODENOSCOPY (EGD)  last one 08-19-2015  dr Henrene Pastor  . DILATION AND CURETTAGE OF UTERUS  1973  . DOBUTAMINE STRESS ECHO  03-05-2010   dr Stanford Breed   normal stress echo w/ normal LVEF   . KNEE ARTHROSCOPY Left 12/2016  . KNEE ARTHROSCOPY WITH MEDIAL MENISECTOMY Left 11/09/2017   Procedure: LEFT KNEE ARTHROSCOPY, DEBRIDEMENT WITH PARTIAL MEDIAL AND LATERAL MENISECTOMY, CHONDROPLASTY, ARTHROSCOPIC ASSISTED INTERNAL FIXATION MEDIAL TIBIAL PLATEAU FRACTURE;  Surgeon: Sydnee Cabal, MD;  Location: [facility];  Service: Orthopedics;  Laterality: Left;  . TRANSTHORACIC ECHOCARDIOGRAM  06/15/2017    dr Marlou Porch   mild LVH, ef 39-76%, grade 1 diastolic dysfunction/  mild LAE/  aneurysmal interatrial septum-- cannot exclude PFO/  trivial TR/ impressions: there is severe hypertrophy of the mid and apical segments highly suspicious for an apical form of hypertrophic cardiomyopathy    There were no vitals filed for this visit.  Subjective Assessment - 12/04/17 1619    Subjective  Patient reports that her MD followed up with her about her surgical knee- made an appointment and went to see the  MD who was not too worried about her pain- says it it normal and should work itself out. Will be getting a new perscription cream for pain. Reports she is limping intermittently.     Pertinent History  2 prior L knee surgeries, HTN, hypertrophic cardiomyopathy    Patient Stated Goals  get back to work, recreational walking, be able to dance    Currently in Pain?  No/denies                       [facility] Adult PT Treatment/Exercise - 12/04/17 0001      Knee/Hip Exercises: Stretches   Quad Stretch  20 seconds;Both;1 rep standing with1 UE support on chair; using strap    Hip Flexor Stretch  2 reps;20 seconds;Left PT OP into knee flexion as tolerated      Knee/Hip Exercises: Aerobic   Recumbent Bike  Lvl 2, 6 min       Knee/Hip Exercises: Standing   Wall Squat  10 reps to tolerance      Knee/Hip Exercises: Seated   Other Seated Knee/Hip Exercises  Fitter knee flexion/extension; 2 blue bands; 10x each LE      Knee/Hip Exercises: Supine   Bridges  Strengthening;Both;1 set;15 reps;Limitations    Bridges Limitations  B LEs on orange pball    Single Leg Bridge  1 set;Strengthening;Both;10 reps;Limitations bridge with alt march    Straight Leg Raises  Left;Strengthening;1 set;15 reps VCs to avoid quad lag    Straight Leg Raises Limitations  2#    Straight Leg Raise with External Rotation  Left;Strengthening;15 reps;Limitations VCs to avoid quad lag    Straight Leg Raise with External Rotation Limitations  2#    Other Supine Knee/Hip Exercises  Supine HS curl with LEs on orange pball; 15x      Knee/Hip Exercises: Sidelying   Clams  B LEs; 15x; red TB      Vasopneumatic   Number Minutes Vasopneumatic   15 minutes    Vasopnuematic Location   Knee    Vasopneumatic Pressure  Medium    Vasopneumatic Temperature   Coldest Temp.                PT Short Term Goals - 11/28/17 1411      PT SHORT TERM GOAL #1   Title  Patient to be independent with initial HEP (01/19/17)     Time  2    Period  Weeks    Status  On-going        PT Long Term Goals - 11/28/17 1427      PT LONG TERM GOAL #1   Title  Patient to be independent with advanced HEP (02/16/17)    Time  4    Period  Weeks    Status  On-going      PT LONG TERM GOAL #2   Title  Patient to  improve L LE strength to >/= 4+/5     Time  4    Period  Weeks    Status  On-going      PT LONG TERM GOAL #3   Title  Patient to demonstrate sit<>stand transfers without provocation of knee pain.    Time  4    Period  Weeks    Status  On-going      PT LONG TERM GOAL #4   Title  Patient to demonstrate ability to ascend/descend 15 steps reciprocally without evidence of instability.    Time  4    Period  Weeks    Status  On-going            Plan - 12/04/17 1716    Clinical Impression Statement  Patient arrived to session with report that she received a return phone call from MD about her L knee pain- scheduled an appointment and met with MD who assured her that this pain was normal for her to experience and sent in a prescription cream to manage her pain. Today patient without c/o knee pain during ther-ex. Able to perform progressive glute, quad, HS strengthening with good form; VC/TCs required intermittently to correct technique.  Received gameready to L knee; normal integumentary response at end of session. Patient showing good progress towards strength goals thus far.     PT Treatment/Interventions  Cryotherapy;Electrical Stimulation;Iontophoresis 61m/ml Dexamethasone;Moist Heat;Therapeutic exercise;Therapeutic activities;Functional mobility training;Stair training;Gait training;Ultrasound;Balance training;Neuromuscular re-education;Patient/family education;Manual techniques;Vasopneumatic Device;Taping;Dry needling;Passive range of motion;Scar mobilization    PT Next Visit Plan  Continue quad stretching/knee PROM, anterior/lateral step ups, assess stairs in stairwell     Consulted and Agree with Plan of Care   Patient       Patient will benefit from skilled therapeutic intervention in order to improve the following deficits and impairments:  Abnormal gait, Decreased endurance, Decreased activity tolerance, Decreased strength, Pain, Decreased mobility, Difficulty walking, Decreased range of motion  Visit Diagnosis: Acute pain of left knee  Stiffness of left knee, not elsewhere classified  Other abnormalities of gait and mobility  Muscle weakness (generalized)  Difficulty in walking, not elsewhere classified     Problem List Patient Active Problem List   Diagnosis Date Noted  . S/P arthroscopy of left knee 11/09/2017  . Apical [place]rophic [place] 10/17/2017  . Vitamin D deficiency   . Tinnitus   . Rhinitis   . Hypertension   . Hiatal hernia   . Hemorrhoids, internal   . Hematuria, microscopic   . GERD (gastroesophageal reflux disease)   . Floaters   . Family history of adverse reaction to anesthesia   . Esophageal stricture   . Dysrhythmia   . Dry eyes   . Arthritis   . Atypical chest pain   . Pre-diabetes 03/29/2016  . Cholelithiasis with chronic cholecystitis 02/04/2016  . Chronic cholecystitis with calculus 02/02/2016  . Esophagitis 09/03/2015  . Liver fibrosis 09/03/2015  . History of hepatitis C- cured 2017 07/22/2015  . Hepatitis C 06/18/2015  . B12 deficiency 11/07/2014  . Plantar fasciitis of right foot 08/13/2014  . Deformity of metatarsal bone of right foot 08/13/2014  . Dyspnea 02/08/2013  . HSV antigen DIF positive 02/09/2011  . HIATAL HERNIA 07/13/2010  . NONSPEC ELEVATION OF LEVELS OF TRANSAMINASE/LDH 04/16/2010  . ESOPHAGEAL STRICTURE 02/05/2010  . RHINITIS 09/25/2008  . CHOLELITHIASIS 08/05/2008  . Essential hypertension 04/05/2007  . Nonspecific abnormal electrocardiogram (ECG) (EKG) 03/13/2007    YJanene Harvey PT, DPT 12/04/17 5:20  PM   [facility] [address]  [place], [place], [address] Phone: [phone]   Fax:  [phone]  Name: Tyanna Hach MRN: [id number] Date of Birth: Aug 26, 1950

## 2017-12-04 NOTE — Therapy (Signed)
[facility] [address]  [place], [place], [address] Phone: [phone]   Fax:  [phone]  Physical Therapy Treatment  Patient Details  Name: Anna [name] MRN: [id number] Date of Birth: 1951/06/27 Referring Provider: Jerilynn Mages, PA-C   Encounter Date: 12/04/2017  PT End of Session - 12/04/17 1659    Visit Number  5    Number of Visits  8    Date for PT Re-Evaluation  12/20/17    Authorization Type  BCBS    PT Start Time  1618    PT Stop Time  6962    PT Time Calculation (min)  57 min    Activity Tolerance  Patient tolerated treatment well    Behavior During Therapy  [facility] for tasks assessed/performed       Past Medical History:  Diagnosis Date  . Acute meniscal tear of left knee   . Apical variant hypertrophic cardiomyopathy ([place]) no episodes of syncope, no family hx sudden cardiac death, and no palpitations:  holter monitor 10-27-2017 showed PVC"s/ PAC's   cardiologist-  dr Marlou Porch--- per lov note, dated 10-17-2017-- Cardiac CT (08-24-2017) calcium 0, normal coronary orgin with right dominance, no evidence CAD, there is severe LVH predominantly in the apical segments that are obliterating LV cavity, no apical aneurysm is seen:  symptomatic with chest tightness and sob with activity/  . Atypical chest pain    Negative nuclear stress test , done 03-17-2006,  referr to cardiologist notes by dr Marlou Porch in epic  . Family history of adverse reaction to anesthesia    pts sister - severe PONV  . Floaters in visual field, bilateral   . Hemorrhoids, internal    and external  . Hiatal hernia   . History of hepatitis C    dx 01/ 2017--  followed by infectious disease, dr comer,  Genotype 1b, F2/3 per elastography-- pt completed harvoni treatment 05/ 2017, now cured;  last [place] abd/ elastography 08-24-2016 F0/1  . History of uterine fibroid   . Hypertension   . OA (osteoarthritis)    left knee, hands  . Pre-diabetes    . S/P dilatation of esophageal stricture   . Seasonal allergic rhinitis   . SOB (shortness of breath) on exertion   . Vitamin D deficiency     Past Surgical History:  Procedure Laterality Date  . ABDOMINAL HYSTERECTOMY  1994   w/ Unilateral Salpingoophorectomy   . APPENDECTOMY  age 60  . CARDIOVASCULAR STRESS TEST  03/17/2006   normal adenosine nuclear study , no exercise/ no evidence ischemia/  normal LV function and wall motion , ef 75%  . CHOLECYSTECTOMY N/A 02/04/2016   Procedure: LAPAROSCOPIC CHOLECYSTECTOMY ;  Surgeon: Armandina Gemma, MD;  Location: [facility];  Service: General;  Laterality: N/A;  . CHONDROPLASTY Left 11/09/2017   Procedure: CHONDROPLASTY;  Surgeon: Sydnee Cabal, MD;  Location[facility];  Service: Orthopedics;  Laterality: Left;  . COLONOSCOPY WITH ESOPHAGOGASTRODUODENOSCOPY (EGD)  last one 08-19-2015  dr Henrene Pastor  . DILATION AND CURETTAGE OF UTERUS  1973  . DOBUTAMINE STRESS ECHO  03-05-2010   dr Stanford Breed   normal stress echo w/ normal LVEF   . KNEE ARTHROSCOPY Left 12/2016  . KNEE ARTHROSCOPY WITH MEDIAL MENISECTOMY Left 11/09/2017   Procedure: LEFT KNEE ARTHROSCOPY, DEBRIDEMENT WITH PARTIAL MEDIAL AND LATERAL MENISECTOMY, CHONDROPLASTY, ARTHROSCOPIC ASSISTED INTERNAL FIXATION MEDIAL TIBIAL PLATEAU FRACTURE;  Surgeon: Sydnee Cabal, MD;  Location: [facility];  Service: Orthopedics;  Laterality: Left;  . TRANSTHORACIC ECHOCARDIOGRAM  06/15/2017    dr Marlou Porch   mild LVH, ef 08-67%, grade 1 diastolic dysfunction/  mild LAE/  aneurysmal interatrial septum-- cannot exclude PFO/  trivial TR/ impressions: there is severe hypertrophy of the mid and apical segments highly suspicious for an apical form of hypertrophic cardiomyopathy    There were no vitals filed for this visit.  Subjective Assessment - 12/04/17 1619    Subjective  Patient reports that her MD followed up with her about her surgical knee- made an appointment and went to see the  MD who was not too worried about her pain- says it it normal and should work itself out. Will be getting a new prescription cream for pain. Reports she is limping intermittently.     Pertinent History  2 prior L knee surgeries, HTN, hypertrophic cardiomyopathy    Patient Stated Goals  get back to work, recreational walking, be able to dance    Currently in Pain?  No/denies                       [facility] Adult PT Treatment/Exercise - 12/04/17 0001      Knee/Hip Exercises: Stretches   Quad Stretch  20 seconds;Both;1 rep standing with1 UE support on chair; using strap    Hip Flexor Stretch  2 reps;20 seconds;Left PT OP into knee flexion as tolerated      Knee/Hip Exercises: Aerobic   Recumbent Bike  Lvl 2, 6 min       Knee/Hip Exercises: Standing   Wall Squat  10 reps to tolerance      Knee/Hip Exercises: Seated   Other Seated Knee/Hip Exercises  Fitter knee flexion/extension; 2 blue bands; 10x each LE      Knee/Hip Exercises: Supine   Bridges  Strengthening;Both;1 set;15 reps;Limitations    Bridges Limitations  B LEs on orange pball    Single Leg Bridge  1 set;Strengthening;Both;10 reps;Limitations bridge with alt march    Straight Leg Raises  Left;Strengthening;1 set;15 reps VCs to avoid quad lag    Straight Leg Raises Limitations  2#    Straight Leg Raise with External Rotation  Left;Strengthening;15 reps;Limitations VCs to avoid quad lag    Straight Leg Raise with External Rotation Limitations  2#    Other Supine Knee/Hip Exercises  Supine HS curl with LEs on orange pball; 15x      Knee/Hip Exercises: Sidelying   Clams  B LEs; 15x; red TB      Vasopneumatic   Number Minutes Vasopneumatic   15 minutes    Vasopnuematic Location   Knee    Vasopneumatic Pressure  Medium    Vasopneumatic Temperature   Coldest Temp.                PT Short Term Goals - 11/28/17 1411      PT SHORT TERM GOAL #1   Title  Patient to be independent with initial HEP (01/19/17)     Time  2    Period  Weeks    Status  On-going        PT Long Term Goals - 11/28/17 1427      PT LONG TERM GOAL #1   Title  Patient to be independent with advanced HEP (02/16/17)    Time  4    Period  Weeks    Status  On-going      PT LONG TERM GOAL #2   Title  Patient to  improve L LE strength to >/= 4+/5     Time  4    Period  Weeks    Status  On-going      PT LONG TERM GOAL #3   Title  Patient to demonstrate sit<>stand transfers without provocation of knee pain.    Time  4    Period  Weeks    Status  On-going      PT LONG TERM GOAL #4   Title  Patient to demonstrate ability to ascend/descend 15 steps reciprocally without evidence of instability.    Time  4    Period  Weeks    Status  On-going            Plan - 12/04/17 1716    Clinical Impression Statement  Patient arrived to session with report that she received a return phone call from MD about her L knee pain- scheduled an appointment and met with MD who assured her that this pain was normal for her to experience and sent in a prescription cream to manage her pain. Today patient without c/o knee pain during ther-ex. Able to perform progressive glute, quad, HS strengthening with good form; VC/TCs required intermittently to correct technique.  Received gameready to L knee; normal integumentary response at end of session. Patient showing good progress towards strength goals thus far.        Patient will benefit from skilled therapeutic intervention in order to improve the following deficits and impairments:  Abnormal gait, Decreased endurance, Decreased activity tolerance, Decreased strength, Pain, Decreased mobility, Difficulty walking, Decreased range of motion  Visit Diagnosis: Acute pain of left knee  Stiffness of left knee, not elsewhere classified  Other abnormalities of gait and mobility  Muscle weakness (generalized)  Difficulty in walking, not elsewhere classified     Problem List Patient Active  Problem List   Diagnosis Date Noted  . S/P arthroscopy of left knee 11/09/2017  . Apical variant hypertrophic cardiomyopathy ([place]) 10/17/2017  . Vitamin D deficiency   . Tinnitus   . Rhinitis   . Hypertension   . Hiatal hernia   . Hemorrhoids, internal   . Hematuria, microscopic   . GERD (gastroesophageal reflux disease)   . Floaters   . Family history of adverse reaction to anesthesia   . Esophageal stricture   . Dysrhythmia   . Dry eyes   . Arthritis   . Atypical chest pain   . Pre-diabetes 03/29/2016  . Cholelithiasis with chronic cholecystitis 02/04/2016  . Chronic cholecystitis with calculus 02/02/2016  . Esophagitis 09/03/2015  . Liver fibrosis 09/03/2015  . History of hepatitis C- cured 2017 07/22/2015  . Hepatitis C 06/18/2015  . B12 deficiency 11/07/2014  . Plantar fasciitis of right foot 08/13/2014  . Deformity of metatarsal bone of right foot 08/13/2014  . Dyspnea 02/08/2013  . HSV antigen DIF positive 02/09/2011  . HIATAL HERNIA 07/13/2010  . NONSPEC ELEVATION OF LEVELS OF TRANSAMINASE/LDH 04/16/2010  . ESOPHAGEAL STRICTURE 02/05/2010  . RHINITIS 09/25/2008  . CHOLELITHIASIS 08/05/2008  . Essential hypertension 04/05/2007  . Nonspecific abnormal electrocardiogram (ECG) (EKG) 03/13/2007    June Leap 12/04/2017, 5:18 PM  [facility] [address]  [place], [place], [address] Phone: [phone]   Fax:  [phone]  Name: Anna [name] MRN: [id number] Date of Birth: May 21, 1951

## 2017-12-07 ENCOUNTER — Encounter: Payer: Self-pay | Admitting: Physical Therapy

## 2017-12-07 ENCOUNTER — Ambulatory Visit: Payer: BLUE CROSS/BLUE SHIELD | Admitting: Physical Therapy

## 2017-12-07 DIAGNOSIS — R2689 Other abnormalities of gait and mobility: Secondary | ICD-10-CM

## 2017-12-07 DIAGNOSIS — M25662 Stiffness of left knee, not elsewhere classified: Secondary | ICD-10-CM

## 2017-12-07 DIAGNOSIS — M25562 Pain in left knee: Secondary | ICD-10-CM | POA: Diagnosis not present

## 2017-12-07 DIAGNOSIS — R262 Difficulty in walking, not elsewhere classified: Secondary | ICD-10-CM

## 2017-12-07 DIAGNOSIS — M6281 Muscle weakness (generalized): Secondary | ICD-10-CM

## 2017-12-07 NOTE — Therapy (Signed)
[facility] [address]  [place], [place], [address] Phone: [phone]   Fax:  [phone]  Physical Therapy Treatment  Patient Details  Name: Anna [name] MRN: [id number] Date of Birth: November 11, 1950 Referring Provider: Jerilynn Mages, PA-C   Encounter Date: 12/07/2017  PT End of Session - 12/07/17 1718    Visit Number  6    Number of Visits  8    Date for PT Re-Evaluation  12/20/17    Authorization Type  BCBS    PT Start Time  1620    PT Stop Time  1714    PT Time Calculation (min)  54 min    Equipment Utilized During Treatment  Gait belt    Activity Tolerance  Patient tolerated treatment well    Behavior During Therapy  [facility] for tasks assessed/performed       Past Medical History:  Diagnosis Date  . Acute meniscal tear of left knee   . Apical variant hypertrophic cardiomyopathy ([place]) no episodes of syncope, no family hx sudden cardiac death, and no palpitations:  holter monitor 10-27-2017 showed PVC"s/ PAC's   cardiologist-  dr Marlou Porch--- per lov note, dated 10-17-2017-- Cardiac CT (08-24-2017) calcium 0, normal coronary orgin with right dominance, no evidence CAD, there is severe LVH predominantly in the apical segments that are obliterating LV cavity, no apical aneurysm is seen:  symptomatic with chest tightness and sob with activity/  . Atypical chest pain    Negative nuclear stress test , done 03-17-2006,  referr to cardiologist notes by dr Marlou Porch in epic  . Family history of adverse reaction to anesthesia    pts sister - severe PONV  . Floaters in visual field, bilateral   . Hemorrhoids, internal    and external  . Hiatal hernia   . History of hepatitis C    dx 01/ 2017--  followed by infectious disease, dr comer,  Genotype 1b, F2/3 per elastography-- pt completed harvoni treatment 05/ 2017, now cured;  last [place] abd/ elastography 08-24-2016 F0/1  . History of uterine fibroid   . Hypertension   . OA  (osteoarthritis)    left knee, hands  . Pre-diabetes   . S/P dilatation of esophageal stricture   . Seasonal allergic rhinitis   . SOB (shortness of breath) on exertion   . Vitamin D deficiency     Past Surgical History:  Procedure Laterality Date  . ABDOMINAL HYSTERECTOMY  1994   w/ Unilateral Salpingoophorectomy   . APPENDECTOMY  age 81  . CARDIOVASCULAR STRESS TEST  03/17/2006   normal adenosine nuclear study , no exercise/ no evidence ischemia/  normal LV function and wall motion , ef 75%  . CHOLECYSTECTOMY N/A 02/04/2016   Procedure: LAPAROSCOPIC CHOLECYSTECTOMY ;  Surgeon: Armandina Gemma, MD;  Location: [facility];  Service: General;  Laterality: N/A;  . CHONDROPLASTY Left 11/09/2017   Procedure: CHONDROPLASTY;  Surgeon: Sydnee Cabal, MD;  Location: [facility];  Service: Orthopedics;  Laterality: Left;  . COLONOSCOPY WITH ESOPHAGOGASTRODUODENOSCOPY (EGD)  last one 08-19-2015  dr Henrene Pastor  . DILATION AND CURETTAGE OF UTERUS  1973  . DOBUTAMINE STRESS ECHO  03-05-2010   dr Stanford Breed   normal stress echo w/ normal LVEF   . KNEE ARTHROSCOPY Left 12/2016  . KNEE ARTHROSCOPY WITH MEDIAL MENISECTOMY Left 11/09/2017   Procedure: LEFT KNEE ARTHROSCOPY, DEBRIDEMENT WITH PARTIAL MEDIAL AND LATERAL MENISECTOMY, CHONDROPLASTY, ARTHROSCOPIC ASSISTED INTERNAL FIXATION MEDIAL TIBIAL PLATEAU FRACTURE;  Surgeon: Sydnee Cabal,  MD;  Location: [facility];  Service: Orthopedics;  Laterality: Left;  . TRANSTHORACIC ECHOCARDIOGRAM  06/15/2017    dr Marlou Porch   mild LVH, ef 22-02%, grade 1 diastolic dysfunction/  mild LAE/  aneurysmal interatrial septum-- cannot exclude PFO/  trivial TR/ impressions: there is severe hypertrophy of the mid and apical segments highly suspicious for an apical form of hypertrophic cardiomyopathy    There were no vitals filed for this visit.  Subjective Assessment - 12/07/17 1621    Subjective  Patient reports she received the medication from her MD.  Also had a massage to her hamstrings on surgical side and says it feels a little better.    Pertinent History  2 prior L knee surgeries, HTN, hypertrophic cardiomyopathy    Patient Stated Goals  get back to work, recreational walking, be able to dance    Currently in Pain?  No/denies                       [facility] Adult PT Treatment/Exercise - 12/07/17 0001      Ambulation/Gait   Stairs  Yes    Stairs Assistance  7: Independent    Stair Management Technique  One rail Left;Alternating pattern    Number of Stairs  14 2 flights    Height of Stairs  8    Gait Comments  VCs to encourage slow eccentric lower, esp on L LE to avoid dropping down quickly      Knee/Hip Exercises: Aerobic   Recumbent Bike  Lvl 2, 6 min       Knee/Hip Exercises: Standing   Functional Squat  1 set;15 reps;Limitations    Functional Squat Limitations  VC/TCs to encourage hip ER and glute activation      Knee/Hip Exercises: Supine   Bridges  Strengthening;Both;1 set;15 reps LEs on orange pball; knees straight    Single Leg Bridge  1 set;Strengthening;Both;10 reps;Limitations bridge with alt march    Straight Leg Raises  Left;Strengthening;1 set;15 reps VCs to avoid quad lag    Straight Leg Raises Limitations  3#      Knee/Hip Exercises: Sidelying   Clams  B LEs; 15x; red TB VCs to avoid trunk rotation      Vasopneumatic   Number Minutes Vasopneumatic   10 minutes    Vasopnuematic Location   Knee    Vasopneumatic Pressure  Medium    Vasopneumatic Temperature   Coldest Temp.       Manual Therapy   Manual Therapy  Soft tissue mobilization    Manual therapy comments  prone    Soft tissue mobilization  medial and lateral HS and superior gastroc; tightness in medial HS and gastroc             PT Education - 12/07/17 1717    Education Details  Updated HEP    Person(s) Educated  Patient    Methods  Explanation    Comprehension  Verbalized understanding       PT Short Term Goals -  11/28/17 1411      PT SHORT TERM GOAL #1   Title  Patient to be independent with initial HEP (01/19/17)    Time  2    Period  Weeks    Status  On-going        PT Long Term Goals - 11/28/17 1427      PT LONG TERM GOAL #1   Title  Patient to be independent with advanced HEP (  02/16/17)    Time  4    Period  Weeks    Status  On-going      PT LONG TERM GOAL #2   Title  Patient to improve L LE strength to >/= 4+/5     Time  4    Period  Weeks    Status  On-going      PT LONG TERM GOAL #3   Title  Patient to demonstrate sit<>stand transfers without provocation of knee pain.    Time  4    Period  Weeks    Status  On-going      PT LONG TERM GOAL #4   Title  Patient to demonstrate ability to ascend/descend 15 steps reciprocally without evidence of instability.    Time  4    Period  Weeks    Status  On-going            Plan - 12/07/17 1718    Clinical Impression Statement  Patient arrived to session with report that L knee felt a bit better today- no limping noted; not sure if this is because she received her topical cream from her MD or because she had a massage to her L hamstrings today. Patient tolerated progressive hip and quad strengthening exercises. Also performed stair climbing- patient prefers to use 1 handrail for safety; able to perform without handrail with CGA but demonstrating limited eccentric lower on L LE; VCs required to slow descent and avoid bouncing. Patient tolerated standing squats with UEs at counter for balance- VC/TCs required to encourage hip ER and engage glutes. Tolerated STM to L medial and lateral HS and gastroc- soft tissue restriction noted in medial HS and gastroc. Received Gameready at end of session; normal integumentary response noted. Patient given TB to progress SLing clamshell and instructed to perform bridges with marching to progress HEP; offered handout- patient declined.     PT Treatment/Interventions  Cryotherapy;Electrical  Stimulation;Iontophoresis 4mg /ml Dexamethasone;Moist Heat;Therapeutic exercise;Therapeutic activities;Functional mobility training;Stair training;Gait training;Ultrasound;Balance training;Neuromuscular re-education;Patient/family education;Manual techniques;Vasopneumatic Device;Taping;Dry needling;Passive range of motion;Scar mobilization    PT Next Visit Plan  anterior/lateral step ups focusing on eccentric lower, cybex knee flexion/extension, leg press     Consulted and Agree with Plan of Care  Patient       Patient will benefit from skilled therapeutic intervention in order to improve the following deficits and impairments:  Abnormal gait, Decreased endurance, Decreased activity tolerance, Decreased strength, Pain, Decreased mobility, Difficulty walking, Decreased range of motion  Visit Diagnosis: Acute pain of left knee  Stiffness of left knee, not elsewhere classified  Other abnormalities of gait and mobility  Muscle weakness (generalized)  Difficulty in walking, not elsewhere classified     Problem List Patient Active Problem List   Diagnosis Date Noted  . S/P arthroscopy of left knee 11/09/2017  . Apical variant hypertrophic cardiomyopathy ([place]) 10/17/2017  . Vitamin D deficiency   . Tinnitus   . Rhinitis   . Hypertension   . Hiatal hernia   . Hemorrhoids, internal   . Hematuria, microscopic   . GERD (gastroesophageal reflux disease)   . Floaters   . Family history of adverse reaction to anesthesia   . Esophageal stricture   . Dysrhythmia   . Dry eyes   . Arthritis   . Atypical chest pain   . Pre-diabetes 03/29/2016  . Cholelithiasis with chronic cholecystitis 02/04/2016  . Chronic cholecystitis with calculus 02/02/2016  . Esophagitis 09/03/2015  . Liver fibrosis 09/03/2015  . History  of hepatitis C- cured 2017 07/22/2015  . Hepatitis C 06/18/2015  . B12 deficiency 11/07/2014  . Plantar fasciitis of right foot 08/13/2014  . Deformity of metatarsal bone of  right foot 08/13/2014  . Dyspnea 02/08/2013  . HSV antigen DIF positive 02/09/2011  . HIATAL HERNIA 07/13/2010  . NONSPEC ELEVATION OF LEVELS OF TRANSAMINASE/LDH 04/16/2010  . ESOPHAGEAL STRICTURE 02/05/2010  . RHINITIS 09/25/2008  . CHOLELITHIASIS 08/05/2008  . Essential hypertension 04/05/2007  . Nonspecific abnormal electrocardiogram (ECG) (EKG) 03/13/2007    Janene Harvey, PT, DPT 12/07/17 5:24 PM   [facility] [address]  [place], [place], [address] Phone: [phone]   Fax:  [phone]  Name: Anna [name] MRN: [id number] Date of Birth: [birth year]

## 2017-12-07 NOTE — Patient Instructions (Signed)
Added to HEP:  Sidelying clamshell with red TB Supine bridge with marching

## 2017-12-12 ENCOUNTER — Encounter: Payer: Self-pay | Admitting: Physical Therapy

## 2017-12-12 ENCOUNTER — Ambulatory Visit: Payer: BLUE CROSS/BLUE SHIELD | Admitting: Physical Therapy

## 2017-12-12 DIAGNOSIS — R2689 Other abnormalities of gait and mobility: Secondary | ICD-10-CM

## 2017-12-12 DIAGNOSIS — M6281 Muscle weakness (generalized): Secondary | ICD-10-CM

## 2017-12-12 DIAGNOSIS — M25662 Stiffness of left knee, not elsewhere classified: Secondary | ICD-10-CM

## 2017-12-12 DIAGNOSIS — M25562 Pain in left knee: Secondary | ICD-10-CM

## 2017-12-12 DIAGNOSIS — R262 Difficulty in walking, not elsewhere classified: Secondary | ICD-10-CM

## 2017-12-12 NOTE — Therapy (Signed)
[facility] [address]  [place], [place], [address] Phone: [phone]   Fax:  [phone]  Physical Therapy Treatment  Patient Details  Name: Anna [name] MRN: [id number] Date of Birth: 04-02-1951 Referring Provider: Jerilynn Mages, PA-C   Encounter Date: 12/12/2017  PT End of Session - 12/12/17 1802    Visit Number  7    Number of Visits  8    Date for PT Re-Evaluation  12/20/17    Authorization Type  BCBS    PT Start Time  1017    PT Stop Time  1709    PT Time Calculation (min)  52 min    Activity Tolerance  Patient tolerated treatment well    Behavior During Therapy  [facility] for tasks assessed/performed       Past Medical History:  Diagnosis Date  . Acute meniscal tear of left knee   . Apical variant hypertrophic cardiomyopathy ([place]) no episodes of syncope, no family hx sudden cardiac death, and no palpitations:  holter monitor 10-27-2017 showed PVC"s/ PAC's   cardiologist-  dr Marlou Porch--- per lov note, dated 10-17-2017-- Cardiac CT (08-24-2017) calcium 0, normal coronary orgin with right dominance, no evidence CAD, there is severe LVH predominantly in the apical segments that are obliterating LV cavity, no apical aneurysm is seen:  symptomatic with chest tightness and sob with activity/  . Atypical chest pain    Negative nuclear stress test , done 03-17-2006,  referr to cardiologist notes by dr Marlou Porch in epic  . Family history of adverse reaction to anesthesia    pts sister - severe PONV  . Floaters in visual field, bilateral   . Hemorrhoids, internal    and external  . Hiatal hernia   . History of hepatitis C    dx 01/ 2017--  followed by infectious disease, dr comer,  Genotype 1b, F2/3 per elastography-- pt completed harvoni treatment 05/ 2017, now cured;  last [place] abd/ elastography 08-24-2016 F0/1  . History of uterine fibroid   . Hypertension   . OA (osteoarthritis)    left knee, hands  . Pre-diabetes    . S/P dilatation of esophageal stricture   . Seasonal allergic rhinitis   . SOB (shortness of breath) on exertion   . Vitamin D deficiency     Past Surgical History:  Procedure Laterality Date  . ABDOMINAL HYSTERECTOMY  1994   w/ Unilateral Salpingoophorectomy   . APPENDECTOMY  age 83  . CARDIOVASCULAR STRESS TEST  03/17/2006   normal adenosine nuclear study , no exercise/ no evidence ischemia/  normal LV function and wall motion , ef 75%  . CHOLECYSTECTOMY N/A 02/04/2016   Procedure: LAPAROSCOPIC CHOLECYSTECTOMY ;  Surgeon: Armandina Gemma, MD;  Location: [facility];  Service: General;  Laterality: N/A;  . CHONDROPLASTY Left 11/09/2017   Procedure: CHONDROPLASTY;  Surgeon: Sydnee Cabal, MD;  Location: [facility];  Service: Orthopedics;  Laterality: Left;  . COLONOSCOPY WITH ESOPHAGOGASTRODUODENOSCOPY (EGD)  last one 08-19-2015  dr Henrene Pastor  . DILATION AND CURETTAGE OF UTERUS  1973  . DOBUTAMINE STRESS ECHO  03-05-2010   dr Stanford Breed   normal stress echo w/ normal LVEF   . KNEE ARTHROSCOPY Left 12/2016  . KNEE ARTHROSCOPY WITH MEDIAL MENISECTOMY Left 11/09/2017   Procedure: LEFT KNEE ARTHROSCOPY, DEBRIDEMENT WITH PARTIAL MEDIAL AND LATERAL MENISECTOMY, CHONDROPLASTY, ARTHROSCOPIC ASSISTED INTERNAL FIXATION MEDIAL TIBIAL PLATEAU FRACTURE;  Surgeon: Sydnee Cabal, MD;  Location: [place];  Service: Orthopedics;  Laterality: Left;  . TRANSTHORACIC ECHOCARDIOGRAM  06/15/2017    dr Marlou Porch   mild LVH, ef 58-09%, grade 1 diastolic dysfunction/  mild LAE/  aneurysmal interatrial septum-- cannot exclude PFO/  trivial TR/ impressions: there is severe hypertrophy of the mid and apical segments highly suspicious for an apical form of hypertrophic cardiomyopathy    There were no vitals filed for this visit.  Subjective Assessment - 12/12/17 1621    Subjective  Patient reports that L knee is doing okay. Reports that has follow up appt with MD on June 3. Concerned that  medial knee pain persists and says it feels the same way that it did before her surgery. Reports massage last session improved her pain.     Pertinent History  2 prior L knee surgeries, HTN, hypertrophic cardiomyopathy    Patient Stated Goals  get back to work, recreational walking, be able to dance    Pain Score  7     Pain Location  Knee    Pain Orientation  Left;Medial    Pain Descriptors / Indicators  Aching;Dull    Pain Type  Acute pain                       [facility] Adult PT Treatment/Exercise - 12/12/17 0001      Knee/Hip Exercises: Machines for Strengthening   Cybex Knee Extension  B LE; 15#; 15x    Cybex Knee Flexion  B LE; 10#; 15x      Knee/Hip Exercises: Standing   Lateral Step Up  Left;1 set;10 reps;Hand Hold: 0;Step Height: 8"    Lateral Step Up Limitations  VC/TC to avoid valgus collapse    Forward Step Up  Left;1 set;10 reps;Hand Hold: 0;Step Height: 8"    Functional Squat  1 set;15 reps;Limitations    Functional Squat Limitations  VC/TCs to encourage hip ER and glute activation; at counter    Other Standing Knee Exercises  side stepping with red TB around forefeet at counter top; 4x length of counter      Vasopneumatic   Number Minutes Vasopneumatic   10 minutes      Manual Therapy   Manual Therapy  Soft tissue mobilization    Manual therapy comments  prone    Soft tissue mobilization  medial and lateral HS and superior gastroc; tightness in medial HS and gastroc soft tissue restriction in L lateral gastroc               PT Short Term Goals - 11/28/17 1411      PT SHORT TERM GOAL #1   Title  Patient to be independent with initial HEP (01/19/17)    Time  2    Period  Weeks    Status  On-going        PT Long Term Goals - 11/28/17 1427      PT LONG TERM GOAL #1   Title  Patient to be independent with advanced HEP (02/16/17)    Time  4    Period  Weeks    Status  On-going      PT LONG TERM GOAL #2   Title  Patient to improve L LE  strength to >/= 4+/5     Time  4    Period  Weeks    Status  On-going      PT LONG TERM GOAL #3   Title  Patient to demonstrate sit<>stand transfers without provocation of knee pain.  Time  4    Period  Weeks    Status  On-going      PT LONG TERM GOAL #4   Title  Patient to demonstrate ability to ascend/descend 15 steps reciprocally without evidence of instability.    Time  4    Period  Weeks    Status  On-going            Plan - 12/12/17 1804    Clinical Impression Statement  Patient arrived at session with report that she has had L medial knee pain with walking recently. Has follow up appointment with MD on June 3rd. Tolerated STM to L distal hamstrings and medial/lateral gastroc; soft tissue restriction noted in lateral gastroc. Tolerated progressive quad/HS exercises without report of pain this session. Patient performed anterior and lateral step ups on 8" step with good form and eccentric control. Received [place] to L knee at end of session; normal integumentary response observed. Patient has been progressing well with PT thus far; showing good L knee motion and strength; only limited functionally by pain at this point.    PT Treatment/Interventions  Cryotherapy;Electrical Stimulation;Iontophoresis 4mg /ml Dexamethasone;Moist Heat;Therapeutic exercise;Therapeutic activities;Functional mobility training;Stair training;Gait training;Ultrasound;Balance training;Neuromuscular re-education;Patient/family education;Manual techniques;Vasopneumatic Device;Taping;Dry needling;Passive range of motion;Scar mobilization    PT Next Visit Plan  re-certification next session; consider e-stim for pain relief    Consulted and Agree with Plan of Care  Patient       Patient will benefit from skilled therapeutic intervention in order to improve the following deficits and impairments:  Abnormal gait, Decreased endurance, Decreased activity tolerance, Decreased strength, Pain, Decreased mobility,  Difficulty walking, Decreased range of motion  Visit Diagnosis: Acute pain of left knee  Stiffness of left knee, not elsewhere classified  Other abnormalities of gait and mobility  Muscle weakness (generalized)  Difficulty in walking, not elsewhere classified     Problem List Patient Active Problem List   Diagnosis Date Noted  . S/P arthroscopy of left knee 11/09/2017  . Apical variant hypertrophic cardiomyopathy ([place]) 10/17/2017  . Vitamin D deficiency   . Tinnitus   . Rhinitis   . Hypertension   . Hiatal hernia   . Hemorrhoids, internal   . Hematuria, microscopic   . GERD (gastroesophageal reflux disease)   . Floaters   . Family history of adverse reaction to anesthesia   . Esophageal stricture   . Dysrhythmia   . Dry eyes   . Arthritis   . Atypical chest pain   . Pre-diabetes 03/29/2016  . Cholelithiasis with chronic cholecystitis 02/04/2016  . Chronic cholecystitis with calculus 02/02/2016  . Esophagitis 09/03/2015  . Liver fibrosis 09/03/2015  . History of hepatitis C- cured 2017 07/22/2015  . Hepatitis C 06/18/2015  . B12 deficiency 11/07/2014  . Plantar fasciitis of right foot 08/13/2014  . Deformity of metatarsal bone of right foot 08/13/2014  . Dyspnea 02/08/2013  . HSV antigen DIF positive 02/09/2011  . HIATAL HERNIA 07/13/2010  . NONSPEC ELEVATION OF LEVELS OF TRANSAMINASE/LDH 04/16/2010  . ESOPHAGEAL STRICTURE 02/05/2010  . RHINITIS 09/25/2008  . CHOLELITHIASIS 08/05/2008  . Essential hypertension 04/05/2007  . Nonspecific abnormal electrocardiogram (ECG) (EKG) 03/13/2007    Janene Harvey, PT, DPT 12/12/17 6:13 PM   [facility] [address]  [place], [place], [address] Phone: [phone]   Fax:  [phone]  Name: Jaquel Coomer MRN: [id number] Date of Birth: 20-Feb-1951

## 2017-12-14 ENCOUNTER — Encounter: Payer: Self-pay | Admitting: Physical Therapy

## 2017-12-14 ENCOUNTER — Ambulatory Visit: Payer: BLUE CROSS/BLUE SHIELD | Admitting: Physical Therapy

## 2017-12-14 DIAGNOSIS — R2689 Other abnormalities of gait and mobility: Secondary | ICD-10-CM

## 2017-12-14 DIAGNOSIS — M25562 Pain in left knee: Secondary | ICD-10-CM

## 2017-12-14 DIAGNOSIS — R262 Difficulty in walking, not elsewhere classified: Secondary | ICD-10-CM

## 2017-12-14 DIAGNOSIS — M6281 Muscle weakness (generalized): Secondary | ICD-10-CM

## 2017-12-14 DIAGNOSIS — M25662 Stiffness of left knee, not elsewhere classified: Secondary | ICD-10-CM

## 2017-12-14 NOTE — Therapy (Addendum)
[facility] [address]  [place], [place], [address] Phone: [phone]   Fax:  [phone]  Physical Therapy Treatment  Patient Details  Name: Simisola Sandles MRN: [id number] Date of Birth: 1951/05/15 Referring Provider: Jerilynn Mages, PA-C   Encounter Date: 12/14/2017  PT End of Session - 12/14/17 1710    Visit Number  8    Number of Visits  8    Date for PT Re-Evaluation  12/20/17    Authorization Type  BCBS    PT Start Time  1615    PT Stop Time  1700    PT Time Calculation (min)  45 min    Activity Tolerance  Patient tolerated treatment well    Behavior During Therapy  [facility] for tasks assessed/performed       Past Medical History:  Diagnosis Date  . Acute meniscal tear of left knee   . Apical variant hypertrophic cardiomyopathy ([place]) no episodes of syncope, no family hx sudden cardiac death, and no palpitations:  holter monitor 10-27-2017 showed PVC"s/ PAC's   cardiologist-  dr Marlou Porch--- per lov note, dated 10-17-2017-- Cardiac CT (08-24-2017) calcium 0, normal coronary orgin with right dominance, no evidence CAD, there is severe LVH predominantly in the apical segments that are obliterating LV cavity, no apical aneurysm is seen:  symptomatic with chest tightness and sob with activity/  . Atypical chest pain    Negative nuclear stress test , done 03-17-2006,  referr to cardiologist notes by dr Marlou Porch in epic  . Family history of adverse reaction to anesthesia    pts sister - severe PONV  . Floaters in visual field, bilateral   . Hemorrhoids, internal    and external  . Hiatal hernia   . History of hepatitis C    dx 01/ 2017--  followed by infectious disease, dr comer,  Genotype 1b, F2/3 per elastography-- pt completed harvoni treatment 05/ 2017, now cured;  last [place] abd/ elastography 08-24-2016 F0/1  . History of uterine fibroid   . Hypertension   . OA (osteoarthritis)    left knee, hands  . Pre-diabetes    . S/P dilatation of esophageal stricture   . Seasonal allergic rhinitis   . SOB (shortness of breath) on exertion   . Vitamin D deficiency     Past Surgical History:  Procedure Laterality Date  . ABDOMINAL HYSTERECTOMY  1994   w/ Unilateral Salpingoophorectomy   . APPENDECTOMY  age 67  . CARDIOVASCULAR STRESS TEST  03/17/2006   normal adenosine nuclear study , no exercise/ no evidence ischemia/  normal LV function and wall motion , ef 75%  . CHOLECYSTECTOMY N/A 02/04/2016   Procedure: LAPAROSCOPIC CHOLECYSTECTOMY ;  Surgeon: Armandina Gemma, MD;  Location: [facility];  Service: General;  Laterality: N/A;  . CHONDROPLASTY Left 11/09/2017   Procedure: CHONDROPLASTY;  Surgeon: Sydnee Cabal, MD;  [facility];  Service: Orthopedics;  Laterality: Left;  . COLONOSCOPY WITH ESOPHAGOGASTRODUODENOSCOPY (EGD)  last one 08-19-2015  dr Henrene Pastor  . DILATION AND CURETTAGE OF UTERUS  1973  . DOBUTAMINE STRESS ECHO  03-05-2010   dr Stanford Breed   normal stress echo w/ normal LVEF   . KNEE ARTHROSCOPY Left 12/2016  . KNEE ARTHROSCOPY WITH MEDIAL MENISECTOMY Left 11/09/2017   Procedure: LEFT KNEE ARTHROSCOPY, DEBRIDEMENT WITH PARTIAL MEDIAL AND LATERAL MENISECTOMY, CHONDROPLASTY, ARTHROSCOPIC ASSISTED INTERNAL FIXATION MEDIAL TIBIAL PLATEAU FRACTURE;  Surgeon: Sydnee Cabal, MD;  Location: [place];  Service: Orthopedics;  Laterality: Left;  . TRANSTHORACIC ECHOCARDIOGRAM  06/15/2017    dr Marlou Porch   mild LVH, ef 35-57%, grade 1 diastolic dysfunction/  mild LAE/  aneurysmal interatrial septum-- cannot exclude PFO/  trivial TR/ impressions: there is severe hypertrophy of the mid and apical segments highly suspicious for an apical form of hypertrophic cardiomyopathy    There were no vitals filed for this visit.  Subjective Assessment - 12/14/17 1618    Subjective  Patient reports that she believes that her pain level has improved considerably since starting massage. Reports  she does not have much difficulty with daily activities at this time. Reports intermittent L patellar pain when standing up but much better than at eval.    Pertinent History  2 prior L knee surgeries, HTN, hypertrophic cardiomyopathy    Patient Stated Goals  get back to work, recreational walking, be able to dance    Currently in Pain?  Yes    Pain Score  7     Pain Location  Knee    Pain Orientation  Left;Medial    Pain Descriptors / Indicators  Dull    Pain Type  Acute pain         [facility] PT Assessment - 12/14/17 0001      Observation/Other Assessments   Focus on Therapeutic Outcomes (FOTO)   Knee: 59 (41% limited)      AROM   Right Knee Extension  0    Right Knee Flexion  136    Left Knee Extension  2    Left Knee Flexion  134      Strength   Strength Assessment Site  Ankle    Right Hip Flexion  4+/5    Right Hip ABduction  4+/5    Right Hip ADduction  4+/5    Left Hip ABduction  4+/5    Left Hip ADduction  4+/5    Right Knee Flexion  4+/5    Right Knee Extension  4+/5    Left Knee Flexion  4+/5    Right/Left Ankle  Right;Left    Right Ankle Dorsiflexion  4+/5    Right Ankle Plantar Flexion  4+/5    Left Ankle Dorsiflexion  4+/5    Left Ankle Plantar Flexion  4+/5                   [facility] Adult PT Treatment/Exercise - 12/14/17 0001      Knee/Hip Exercises: Stretches   Active Hamstring Stretch  2 reps;Both;20 seconds with strap      Knee/Hip Exercises: Aerobic   Recumbent Bike  Lvl 3, 6 min       Knee/Hip Exercises: Standing   Terminal Knee Extension  Left;1 set;15 reps;Theraband    Theraband Level (Terminal Knee Extension)  Level 3 (Green)    Hip ADduction  -- chair for UE support    Functional Squat  1 set;15 reps    Functional Squat Limitations  VC for foot placement    SLS with Vectors  L LE to 5 cones difficulty performing exercise; c/o knee pain; discontinued    Other Standing Knee Exercises  side stepping with red TB around forefeet at  counter top; 2x20 ft      Manual Therapy   Manual Therapy  Soft tissue mobilization    Manual therapy comments  prone    Soft tissue mobilization  medial & lateral hamstrings and gastroc restriction in lateral HS and gastroc  PT Education - 12/14/17 1710    Education provided  Yes    Education Details  Advanced HEP    Person(s) Educated  Patient    Methods  Explanation;Handout;Verbal cues;Tactile cues    Comprehension  Verbalized understanding;Returned demonstration       PT Short Term Goals - 12/14/17 1626      PT SHORT TERM GOAL #1   Title  Patient to be independent with initial HEP (01/19/17)    Time  2    Period  Weeks    Status  Achieved        PT Long Term Goals - 12/14/17 1626      PT LONG TERM GOAL #1   Title  Patient to be independent with advanced HEP (02/16/17)    Time  4    Period  Weeks    Status  On-going Advanced HEP initiated today      PT LONG TERM GOAL #2   Title  Patient to improve L LE strength to >/= 4+/5     Time  4    Period  Weeks    Status  Achieved patient with B LE 4+/5 MMT      PT LONG TERM GOAL #3   Title  Patient to demonstrate sit<>stand transfers without provocation of knee pain.    Time  4    Period  Weeks intermittent patellar and medial knee pain    Status  On-going      PT LONG TERM GOAL #4   Title  Patient to demonstrate ability to ascend/descend 15 steps reciprocally without evidence of instability.    Time  4    Period  Weeks    Status  Achieved            Plan - 12/14/17 1718    Clinical Impression Statement  Patient arrived to session with report that she feels she has considerably improved since initial eval. Still has intermittent L medial knee pain at surgical site when climbing stairs and walking. Patient has partially met or achieved all goals at this time. Demonstrates good L knee ROM and strength, has been diligent with HEP, and able to climb stairs without instability. Patient tolerated  progressive LE strengthening this date. Mild report of pain with SLS to cones on L LE- discontinued. Tolerated STM to L medial and lateral gastroc and HS; restriction in lateral gastroc and HS noted. Patient reports considerable pain relief with STM. Updated and educated patient on advanced HEP- patient reported understanding. Will place patient on 30 day hold at this time d/t patient meeting all goals and not being limited in daily activities. Per patient- MD appointment on Monday; please advise if any concerns arise.     PT Treatment/Interventions  Cryotherapy;Electrical Stimulation;Iontophoresis '4mg'$ /ml Dexamethasone;Moist Heat;Therapeutic exercise;Therapeutic activities;Functional mobility training;Stair training;Gait training;Ultrasound;Balance training;Neuromuscular re-education;Patient/family education;Manual techniques;Vasopneumatic Device;Taping;Dry needling;Passive range of motion;Scar mobilization    Consulted and Agree with Plan of Care  Patient       Patient will benefit from skilled therapeutic intervention in order to improve the following deficits and impairments:  Abnormal gait, Decreased endurance, Decreased activity tolerance, Decreased strength, Pain, Decreased mobility, Difficulty walking, Decreased range of motion  Visit Diagnosis: Acute pain of left knee  Stiffness of left knee, not elsewhere classified  Other abnormalities of gait and mobility  Muscle weakness (generalized)  Difficulty in walking, not elsewhere classified     Problem List Patient Active Problem List   Diagnosis Date Noted  . S/P arthroscopy  of left knee 11/09/2017  . Apical variant hypertrophic cardiomyopathy ([place]) 10/17/2017  . Vitamin D deficiency   . Tinnitus   . Rhinitis   . Hypertension   . Hiatal hernia   . Hemorrhoids, internal   . Hematuria, microscopic   . GERD (gastroesophageal reflux disease)   . Floaters   . Family history of adverse reaction to anesthesia   . Esophageal  stricture   . Dysrhythmia   . Dry eyes   . Arthritis   . Atypical chest pain   . Pre-diabetes 03/29/2016  . Cholelithiasis with chronic cholecystitis 02/04/2016  . Chronic cholecystitis with calculus 02/02/2016  . Esophagitis 09/03/2015  . Liver fibrosis 09/03/2015  . History of hepatitis C- cured 2017 07/22/2015  . Hepatitis C 06/18/2015  . B12 deficiency 11/07/2014  . Plantar fasciitis of right foot 08/13/2014  . Deformity of metatarsal bone of right foot 08/13/2014  . Dyspnea 02/08/2013  . HSV antigen DIF positive 02/09/2011  . HIATAL HERNIA 07/13/2010  . NONSPEC ELEVATION OF LEVELS OF TRANSAMINASE/LDH 04/16/2010  . ESOPHAGEAL STRICTURE 02/05/2010  . RHINITIS 09/25/2008  . CHOLELITHIASIS 08/05/2008  . Essential hypertension 04/05/2007  . Nonspecific abnormal electrocardiogram (ECG) (EKG) 03/13/2007    Janene Harvey, PT, DPT 12/14/17 5:24 PM   [facility] [address]  [place], [place], [address] Phone: [phone]   Fax:  [phone]  Name: Pati Thinnes MRN: [id number] Date of Birth: 08/07/1950  PHYSICAL THERAPY DISCHARGE SUMMARY  Visits from Start of Care: 8  Current functional level related to goals / functional outcomes: See above   Remaining deficits: See above   Education / Equipment: See above Plan: Patient agrees to discharge.  Patient goals were partially met. Patient is being discharged due to not returning since the last visit.  ?????     Janene Harvey, PT, DPT 01/22/18 4:43 PM

## 2017-12-20 ENCOUNTER — Ambulatory Visit: Payer: BLUE CROSS/BLUE SHIELD | Admitting: Physical Therapy

## 2018-01-26 MED FILL — MELOXICAM 15 MG TABLET: 15 | 30 days supply | Qty: 30 | Fill #0

## 2018-01-27 NOTE — Progress Notes (Deleted)
[place] [facility] [address] [place], [place] [address] [phone]  Date:  01/29/2018   Name:  Anna [name]   DOB:  Nov 16, 1950   MRN:  [id number]  PCP:  Darreld Mclean, MD    Chief Complaint: No chief complaint on file.   History of Present Illness:  Anna [name] is a 68 y.o. very pleasant female patient who presents with the following:  Following up on a few concerns today She had a left knee scope in April of this year  Last seen here in October of 2018- I sent her the following lab notes at that time:  Blood count is normal  Metabolic profile is normal Your A1c is just barely at the cut off for pre-diabetes.  We will want to continue to keep an eye on this; weight management and exercise will help you prevent diabetes in the future. As per my phone message, your vitamin D is low.  I sent in a high dose weekly supplement for your to take for 12 weeks. Following this, please take 2,000 IU daily (OTC) to maintain your vitamin D level.  Finally, your cholesterol.  While your numbers are not bad, I calculated your estimated 10 year risk of cardiovascular disease based on your cholesterol, BP, etc and your risk is about 14%.  I would suggest that we start you on a cholesterol medication to try and lower this risk.  If you agree, just let me know and I will call this in for you.  Otherwise let's plan to visit in about 6 months.   Lab Results  Component Value Date   HGBA1C 5.7 05/17/2017   From most recent cardiology visit in April of this year:  Apical hypertrophy/angina/chest tightness/shortness of breath with activity/abnormal EKG marked abnormality with T wave inversion diffusely, hypertrophic pattern -Cardiac CT - no CAD. Apical hypertrophy noted.  Explained perfusion gradient, possible cause for some chest discomfort when going up hills. -She has had no episodes of syncope, no early family history of sudden cardiac  death.  No palpitations.    - Will check 24-hour Holter monitor in the future to look for any signs of adverse arrhythmias.  -We will go ahead and increase her Toprol to 50 mg.  Essential hypertension -Continue with amlodipine and Cozaar as well as increasing Toprol.  Blood pressure at home is usually in the 130s over 80s.  She has me if she would be on these 3 medicines long-term.  Potentially, if we had to pull off one medication, the amlodipine would be the first 1 to go.  I would like for her to continue the Toprol given her hypertrophic cardiomyopathy.    Patient Active Problem List   Diagnosis Date Noted  . S/P arthroscopy of left knee 11/09/2017  . Apical variant hypertrophic cardiomyopathy ([place]) 10/17/2017  . Vitamin D deficiency   . Tinnitus   . Rhinitis   . Hypertension   . Hiatal hernia   . Hemorrhoids, internal   . Hematuria, microscopic   . GERD (gastroesophageal reflux disease)   . Floaters   . Family history of adverse reaction to anesthesia   . Esophageal stricture   . Dysrhythmia   . Dry eyes   . Arthritis   . Atypical chest pain   . Pre-diabetes 03/29/2016  . Cholelithiasis with chronic cholecystitis 02/04/2016  . Chronic cholecystitis with calculus 02/02/2016  . Esophagitis 09/03/2015  . Liver fibrosis 09/03/2015  .  History of hepatitis C- cured 2017 07/22/2015  . Hepatitis C 06/18/2015  . B12 deficiency 11/07/2014  . Plantar fasciitis of right foot 08/13/2014  . Deformity of metatarsal bone of right foot 08/13/2014  . Dyspnea 02/08/2013  . HSV antigen DIF positive 02/09/2011  . HIATAL HERNIA 07/13/2010  . NONSPEC ELEVATION OF LEVELS OF TRANSAMINASE/LDH 04/16/2010  . ESOPHAGEAL STRICTURE 02/05/2010  . RHINITIS 09/25/2008  . CHOLELITHIASIS 08/05/2008  . Essential hypertension 04/05/2007  . Nonspecific abnormal electrocardiogram (ECG) (EKG) 03/13/2007    Past Medical History:  Diagnosis Date  . Acute meniscal tear of left knee   . Apical variant  hypertrophic cardiomyopathy ([place]) no episodes of syncope, no family hx sudden cardiac death, and no palpitations:  holter monitor 10-27-2017 showed PVC"s/ PAC's   cardiologist-  dr Marlou Porch--- per lov note, dated 10-17-2017-- Cardiac CT (08-24-2017) calcium 0, normal coronary orgin with right dominance, no evidence CAD, there is severe LVH predominantly in the apical segments that are obliterating LV cavity, no apical aneurysm is seen:  symptomatic with chest tightness and sob with activity/  . Atypical chest pain    Negative nuclear stress test , done 03-17-2006,  referr to cardiologist notes by dr Marlou Porch in epic  . Family history of adverse reaction to anesthesia    pts sister - severe PONV  . Floaters in visual field, bilateral   . Hemorrhoids, internal    and external  . Hiatal hernia   . History of hepatitis C    dx 01/ 2017--  followed by infectious disease, dr comer,  Genotype 1b, F2/3 per elastography-- pt completed harvoni treatment 05/ 2017, now cured;  last [place] abd/ elastography 08-24-2016 F0/1  . History of uterine fibroid   . Hypertension   . OA (osteoarthritis)    left knee, hands  . Pre-diabetes   . S/P dilatation of esophageal stricture   . Seasonal allergic rhinitis   . SOB (shortness of breath) on exertion   . Vitamin D deficiency     Past Surgical History:  Procedure Laterality Date  . ABDOMINAL HYSTERECTOMY  1994   w/ Unilateral Salpingoophorectomy   . APPENDECTOMY  age 3  . CARDIOVASCULAR STRESS TEST  03/17/2006   normal adenosine nuclear study , no exercise/ no evidence ischemia/  normal LV function and wall motion , ef 75%  . CHOLECYSTECTOMY N/A 02/04/2016   Procedure: LAPAROSCOPIC CHOLECYSTECTOMY ;  Surgeon: Armandina Gemma, MD;  Location: [facility];  Service: General;  Laterality: N/A;  . CHONDROPLASTY Left 11/09/2017   Procedure: CHONDROPLASTY;  Surgeon: Sydnee Cabal, MD;  Location: [facility];  Service: Orthopedics;  Laterality: Left;  .  COLONOSCOPY WITH ESOPHAGOGASTRODUODENOSCOPY (EGD)  last one 08-19-2015  dr Henrene Pastor  . DILATION AND CURETTAGE OF UTERUS  1973  . DOBUTAMINE STRESS ECHO  03-05-2010   dr Stanford Breed   normal stress echo w/ normal LVEF   . KNEE ARTHROSCOPY Left 12/2016  . KNEE ARTHROSCOPY WITH MEDIAL MENISECTOMY Left 11/09/2017   Procedure: LEFT KNEE ARTHROSCOPY, DEBRIDEMENT WITH PARTIAL MEDIAL AND LATERAL MENISECTOMY, CHONDROPLASTY, ARTHROSCOPIC ASSISTED INTERNAL FIXATION MEDIAL TIBIAL PLATEAU FRACTURE;  Surgeon: Sydnee Cabal, MD;  Location: [facility];  Service: Orthopedics;  Laterality: Left;  . TRANSTHORACIC ECHOCARDIOGRAM  06/15/2017    dr Marlou Porch   mild LVH, ef 69-48%, grade 1 diastolic dysfunction/  mild LAE/  aneurysmal interatrial septum-- cannot exclude PFO/  trivial TR/ impressions: there is severe hypertrophy of the mid and apical segments highly suspicious for an apical form  of hypertrophic cardiomyopathy    Social History   Tobacco Use  . Smoking status: Never Smoker  . Smokeless tobacco: Never Used  Substance Use Topics  . Alcohol use: Yes    Alcohol/week: 0.0 oz    Comment:   occasionally  . Drug use: No    Family History  Problem Relation Age of Onset  . Hypertension Mother   . Parkinsonism Mother   . Arthritis Mother        OA  . Prostate cancer Father   . Hypertension Sister   . Hypertension Brother   . Prostate cancer Brother   . Hypertension Maternal Grandmother   . Hypertension Sister   . Hypertension Sister   . Heart attack Sister 95       smoker  . Diabetes Neg Hx   . Stroke Neg Hx   . Colon cancer Neg Hx   . Stomach cancer Neg Hx     Allergies  Allergen Reactions  . Tramadol     SEVERE HEADACHES , dizzy, nausea    Medication list has been reviewed and updated.  Current Outpatient Medications on File Prior to Visit  Medication Sig Dispense Refill  . amLODipine (NORVASC) 5 MG tablet Take 1 tablet (5 mg total) by mouth daily. (Patient taking  differently: Take 5 mg by mouth every morning. ) 90 tablet 3  . aspirin EC 325 MG tablet Take 1 tablet (325 mg total) by mouth 2 (two) times daily. 60 tablet 0  . cephALEXin (KEFLEX) 500 MG capsule Take 1 capsule (500 mg total) by mouth 3 (three) times daily. (Patient not taking: Reported on 11/22/2017) 12 capsule 0  . fluticasone (FLONASE) 50 MCG/ACT nasal spray Place 1 spray into both nostrils daily as needed.   3  . HYDROcodone-acetaminophen (NORCO/VICODIN) 5-325 MG tablet Take 1 tablet by mouth every 6 (six) hours as needed for moderate pain.    Marland Kitchen losartan (COZAAR) 100 MG tablet Take 1 tablet (100 mg total) by mouth daily. (Patient taking differently: Take 100 mg by mouth every morning. ) 90 tablet 3  . metoprolol succinate (TOPROL-XL) 50 MG 24 hr tablet Take 1 tablet (50 mg total) by mouth daily. (Patient taking differently: Take 50 mg by mouth every morning. ) 90 tablet 3   No current facility-administered medications on file prior to visit.     Review of Systems:  As per HPI- otherwise negative.   Physical Examination: There were no vitals filed for this visit. There were no vitals filed for this visit. There is no height or weight on file to calculate BMI. Ideal Body Weight:    GEN: WDWN, NAD, Non-toxic, A & O x 3 HEENT: Atraumatic, Normocephalic. Neck supple. No masses, No LAD. Ears and Nose: No external deformity. CV: RRR, No M/G/R. No JVD. No thrill. No extra heart sounds. PULM: CTA B, no wheezes, crackles, rhonchi. No retractions. No resp. distress. No accessory muscle use. ABD: S, NT, ND, +BS. No rebound. No HSM. EXTR: No c/c/e NEURO Normal gait.  PSYCH: Normally interactive. Conversant. Not depressed or anxious appearing.  Calm demeanor.    Assessment and Plan: ***  Signed Lamar Blinks, MD

## 2018-01-29 ENCOUNTER — Telehealth: Payer: Self-pay

## 2018-01-29 ENCOUNTER — Ambulatory Visit: Payer: BLUE CROSS/BLUE SHIELD | Admitting: Family Medicine

## 2018-01-29 NOTE — Telephone Encounter (Signed)
Copied from [place] [id number]. Topic: Appointment Scheduling - Scheduling Inquiry for Clinic >> Jan 29, 2018  7:38 AM Synthia Innocent wrote: Reason for CRM: patient is requesting to cancel appt today, is out of town and will have to rush back.Would like to ask provider if she will be charge if she does cancel. Please advise  >> Jan 29, 2018 10:45 AM Ahmed Prima L wrote: Patient would like to know will she be charged if she does cancel? Patient would like to know as soon as possible because she is out of town for work and would need to rush back if so. >> Jan 29, 2018 11:28 AM Judyann Munson wrote: Patient called to state that she will be able to make it to her appt schedule today at 3pm . Please advise thank you

## 2018-01-29 NOTE — Telephone Encounter (Signed)
Spoke with patient, no charge per pcp. I have canceled appointment. Patient states she will call back to reschedule.

## 2018-03-22 ENCOUNTER — Ambulatory Visit: Payer: BLUE CROSS/BLUE SHIELD | Admitting: Cardiology

## 2018-04-10 NOTE — Progress Notes (Signed)
Cardiology Office Note   Date:  04/11/2018   ID:  Anna [name], DOB [birth year], MRN [id number]  PCP:  Darreld Mclean, MD  Cardiologist:  Dr. Marlou Porch    Chief Complaint  Patient presents with  . Cardiomyopathy      History of Present Illness: Anna [name] is a 67 y.o. female who presents for hypertrophic cardiomyopathy.   Originally she was referred to us/heart care for an exercise treadmill test because of occasional chest discomfort.  Felt SOB up stair, hill, feels SOB fleeting, continue to walk and goes away. Before performing the test, [place] in treadmill noted that her EKG was abnormal and would not be interpretable.  I agreed and set her up for a stress echocardiogram.  Prior to the stress echocardiogram, a baseline echocardiogram was performed and demonstrated her hyperdynamic function and apical hypertrophy.  The treadmill portion of the study was not performed. Dr. Marlou Porch  explained to her what apical hypertrophic cardiomyopathy means, and potentially some of this chest discomfort/shortness of breath that she may feel up inclines may be because of the perfusion gradient difference at the apex with the left ventricular hypertrophy.  She has not been feeling any palpitations.  Once again she has not had any early family history of sudden cardiac death.  No fevers chills nausea vomiting syncope bleeding Cardiac CT: 08/24/17 IMPRESSION: 1. Coronary calcium score of 0. This was 0 percentile for age and sex matched control.  2. Normal coronary origin with right dominance.  3. No evidence of CAD.  4. There is severe left ventricular hypertrophy predominantly in the apical segments that are obliterating LV cavity. No apical aneurysm is seen.   Today feeling well, no chest pain or SOB.  She is exercising  By walking and we discussed her lipids done last Oct.  Followed by PCP.  Diet is very important, less salt she is already eating healthy fats.  Her BP at home is mildly  elevated as well.    Past Medical History:  Diagnosis Date  . Acute meniscal tear of left knee   . Apical variant hypertrophic cardiomyopathy ([place]) no episodes of syncope, no family hx sudden cardiac death, and no palpitations:  holter monitor 10-27-2017 showed PVC"s/ PAC's   cardiologist-  dr Marlou Porch--- per lov note, dated 10-17-2017-- Cardiac CT (08-24-2017) calcium 0, normal coronary orgin with right dominance, no evidence CAD, there is severe LVH predominantly in the apical segments that are obliterating LV cavity, no apical aneurysm is seen:  symptomatic with chest tightness and sob with activity/  . Atypical chest pain    Negative nuclear stress test , done 03-17-2006,  referr to cardiologist notes by dr Marlou Porch in epic  . Chronic cholecystitis with calculus 02/02/2016  . Dyspnea 02/08/2013   Evaluated by Dr Gwenette Greet  Pulmonary function test normal except for mild decrease in diffusing capacity  Normal ventilation/perfusion scan  Resolution without treatment.   Marland Kitchen Dysrhythmia   . Essential hypertension 04/05/2007   Qualifier: Diagnosis of  By: Linna Darner MD, Gwyndolyn Saxon    . Family history of adverse reaction to anesthesia    pts sister - severe PONV  . Floaters in visual field, bilateral   . Hemorrhoids, internal    and external  . Hiatal hernia   . History of hepatitis C    dx 01/ 2017--  followed by infectious disease, dr comer,  Genotype 1b, F2/3 per elastography-- pt completed harvoni treatment 05/ 2017, now cured;  last [place] abd/ elastography 08-24-2016 F0/1  .  History of uterine fibroid   . Hypertension   . Liver fibrosis 09/03/2015   F2/3   . OA (osteoarthritis)    left knee, hands  . Pre-diabetes   . S/P dilatation of esophageal stricture   . Seasonal allergic rhinitis   . SOB (shortness of breath) on exertion   . Vitamin D deficiency     Past Surgical History:  Procedure Laterality Date  . ABDOMINAL HYSTERECTOMY  1994   w/ Unilateral Salpingoophorectomy   . APPENDECTOMY  age 72    . CARDIOVASCULAR STRESS TEST  03/17/2006   normal adenosine nuclear study , no exercise/ no evidence ischemia/  normal LV function and wall motion , ef 75%  . CHOLECYSTECTOMY N/A 02/04/2016   Procedure: LAPAROSCOPIC CHOLECYSTECTOMY ;  Surgeon: Armandina Gemma, MD;  Location: [facility];  Service: [name];  Laterality: N/A;  . CHONDROPLASTY Left 11/09/2017   Procedure: CHONDROPLASTY;  Surgeon: Sydnee Cabal, MD;  Location: [facility];  Service: Orthopedics;  Laterality: Left;  . COLONOSCOPY WITH ESOPHAGOGASTRODUODENOSCOPY (EGD)  last one 08-19-2015  dr Henrene Pastor  . DILATION AND CURETTAGE OF UTERUS  1973  . DOBUTAMINE STRESS ECHO  03-05-2010   dr Stanford Breed   normal stress echo w/ normal LVEF   . KNEE ARTHROSCOPY Left 12/2016  . KNEE ARTHROSCOPY WITH MEDIAL MENISECTOMY Left 11/09/2017   Procedure: LEFT KNEE ARTHROSCOPY, DEBRIDEMENT WITH PARTIAL MEDIAL AND LATERAL MENISECTOMY, CHONDROPLASTY, ARTHROSCOPIC ASSISTED INTERNAL FIXATION MEDIAL TIBIAL PLATEAU FRACTURE;  Surgeon: Sydnee Cabal, MD;  Location: [place];  Service: Orthopedics;  Laterality: Left;  . TRANSTHORACIC ECHOCARDIOGRAM  06/15/2017    dr Marlou Porch   mild LVH, ef 30-09%, grade 1 diastolic dysfunction/  mild LAE/  aneurysmal interatrial septum-- cannot exclude PFO/  trivial TR/ impressions: there is severe hypertrophy of the mid and apical segments highly suspicious for an apical form of hypertrophic cardiomyopathy     Current Outpatient Medications  Medication Sig Dispense Refill  . amLODipine (NORVASC) 5 MG tablet Take 1 tablet (5 mg total) by mouth daily. 90 tablet 3  . losartan (COZAAR) 100 MG tablet Take 1 tablet (100 mg total) by mouth daily. 90 tablet 3  . metoprolol succinate (TOPROL-XL) 50 MG 24 hr tablet Take 1 tablet (50 mg total) by mouth daily. 90 tablet 3   No current facility-administered medications for this visit.     Allergies:   Tramadol    Social History:  The patient  reports that she  has never smoked. She has never used smokeless tobacco. She reports that she drinks alcohol. She reports that she does not use drugs.   Family History:  The patient's family history includes Arthritis in her mother; Heart attack (age of onset: 14) in her sister; Hypertension in her brother, maternal grandmother, mother, sister, sister, and sister; Parkinsonism in her mother; Prostate cancer in her brother and father.    ROS:  [name]:no colds or fevers, no weight changes Skin:no rashes or ulcers HEENT:no blurred vision, no congestion CV:see HPI PUL:see HPI GI:no diarrhea constipation or melena, no indigestion GU:no hematuria, no dysuria MS:no joint pain, no claudication Neuro:no syncope, no lightheadedness Endo:no diabetes, no thyroid disease  Wt Readings from Last 3 Encounters:  04/11/18 159 lb 1.9 oz (72.2 kg)  11/09/17 156 lb 11.2 oz (71.1 kg)  10/17/17 160 lb 9.6 oz (72.8 kg)     PHYSICAL EXAM: VS:  BP (!) 156/84   Pulse (!) 54   Ht 5\' 2"  (1.575 m)   Wt 159  lb 1.9 oz (72.2 kg)   BMI 29.10 kg/m  , BMI Body mass index is 29.1 kg/m. [name]:Pleasant affect, NAD Skin:Warm and dry, brisk capillary refill HEENT:normocephalic, sclera clear, mucus membranes moist Neck:supple, no JVD, no bruits  Heart:S1S2 RRR without murmur, gallup, rub or click Lungs:clear without rales, rhonchi, or wheezes CZY:SAYT, non tender, + BS, do not palpate liver spleen or masses Ext:no lower ext edema, 2+ pedal pulses, 2+ radial pulses Neuro:alert and oriented, MAE, follows commands, + facial symmetry    EKG:  EKG is ordered today. The ekg ordered today demonstrates SB with LVH and associated T wave inversions, no acute changes.    Recent Labs: 05/17/2017: ALT 13; Platelets 218.0 08/22/2017: BUN 12; Creatinine, Ser 0.97 11/09/2017: Hemoglobin 14.6; Potassium 4.2; Sodium 143    Lipid Panel    Component Value Date/Time   CHOL 209 (H) 05/17/2017 1549   TRIG 99.0 05/17/2017 1549   HDL 52.40  05/17/2017 1549   CHOLHDL 4 05/17/2017 1549   VLDL 19.8 05/17/2017 1549   LDLCALC 137 (H) 05/17/2017 1549   LDLDIRECT 139.8 02/02/2011 1600       Other studies Reviewed: Additional studies/ records that were reviewed today include: cardiac CTA see above.   . TTE 05/2017 Study Conclusions  - Left ventricle: The cavity size was normal. Wall thickness was   increased in a pattern of mild LVH. Systolic function was   vigorous. The estimated ejection fraction was in the range of 65%   to 70%. Wall motion was normal; there were no regional wall   motion abnormalities. Doppler parameters are consistent with   abnormal left ventricular relaxation (grade 1 diastolic   dysfunction). The E/e&' ratio is between 8-15, suggesting   indeterminate LV filling pressure. Doppler parameters are   consistent with elevated ventricular end-diastolic filling   pressure. - Left atrium: The atrium was mildly dilated. - Atrial septum: Aneurysmal interatrial septum - cannot exclude   PFO. - Tricuspid valve: There was trivial regurgitation. - Pulmonary arteries: PA peak pressure: 23 mm Hg (S). - Systemic veins: The IVC is small (<1.2 cm) and spontaneously   collapsed, suggesting volume depletion and a low RA pressure of 3   mmHg.  Impressions:  - There is severe hypertrophy of the mid and apical segments highly   suspicious for an apical form of hypertrophic cardiomyopathy.   Consider coronary CTA for evaluation of chest pain and cardiac   MRI for HCM confirmation and risk stratification.  ASSESSMENT AND PLAN:  1.  Atypical hypertrophy cardiomyopathy with no CAD - BP is improved from last visit ,  Though still higher than we would like - she will decrease salt in diet and come back in 3 weeks for BP check if still elevated would increase amlodipine, her BB is at highest dose with HR.    2.  HLD, last LDL 137 would like closer to 100, she will adjust diet further and follow with PCP.    3.  HTN  as above,  Follow up with Dr. Marlou Porch in 6 months.    Current medicines are reviewed with the patient today.  The patient Has no concerns regarding medicines.  The following changes have been made:  See above Labs/ tests ordered today include:see above  Disposition:   FU:  see above  Signed, Cecilie Kicks, NP  04/11/2018 9:52 AM    [facility], [place], [place], [place]  Phone: [phone]; Fax: [phone]

## 2018-04-11 ENCOUNTER — Ambulatory Visit: Payer: BLUE CROSS/BLUE SHIELD | Admitting: Cardiology

## 2018-04-11 ENCOUNTER — Encounter: Payer: Self-pay | Admitting: Cardiology

## 2018-04-11 ENCOUNTER — Encounter (INDEPENDENT_AMBULATORY_CARE_PROVIDER_SITE_OTHER): Payer: Self-pay

## 2018-04-11 VITALS — BP 156/84 | HR 54 | Ht 62.0 in | Wt 159.1 lb

## 2018-04-11 DIAGNOSIS — E7800 Pure hypercholesterolemia, unspecified: Secondary | ICD-10-CM

## 2018-04-11 DIAGNOSIS — I10 Essential (primary) hypertension: Secondary | ICD-10-CM | POA: Diagnosis not present

## 2018-04-11 DIAGNOSIS — I422 Other hypertrophic cardiomyopathy: Secondary | ICD-10-CM

## 2018-04-11 NOTE — Patient Instructions (Signed)
Medication Instructions:   Your physician recommends that you continue on your current medications as directed. Please refer to the Current Medication list given to you today.   If you need a refill on your cardiac medications before your next appointment, please call your pharmacy.  Labwork: NONE ORDERED  TODAY    Testing/Procedures: NONE ORDERED  TODAY     Follow-Up:  IN 3 WEEKS WITH PHARM -D FOR BLOOD PRESSURE CHECK    Your physician wants you to follow-up in:  IN 6  MONTHS WITH DR Marlou Porch You will receive a reminder letter in the mail two months in advance. If you don't receive a letter, please call our office to schedule the follow-up appointment.      Any Other Special Instructions Will Be Listed Below (If Applicable).

## 2018-05-03 ENCOUNTER — Ambulatory Visit (INDEPENDENT_AMBULATORY_CARE_PROVIDER_SITE_OTHER): Payer: BLUE CROSS/BLUE SHIELD | Admitting: Pharmacist

## 2018-05-03 VITALS — BP 152/78 | HR 55

## 2018-05-03 DIAGNOSIS — I10 Essential (primary) hypertension: Secondary | ICD-10-CM | POA: Diagnosis not present

## 2018-05-03 MED ORDER — AMLODIPINE BESYLATE 10 MG PO TABS
10.0000 mg | ORAL_TABLET | Freq: Every day | ORAL | Status: AC
Start: 2018-05-03 — End: ?

## 2018-05-03 NOTE — Progress Notes (Signed)
Patient ID: Anna [name]                 DOB: Dec 26, 1950                      MRN: [id number]     HPI: Anna [name] is a 67 y.o. female patient of Dr. Marlou [name] who presents today for hypertension evaluation. PMH significant for hypertrophic cardiomyopathy. At her last visit it was recommended to decrease sodium intake and follow up with HTN clinic.  She presents today for follow up. She denies chest pain, SOB, dizziness, and headache. Overall she is doing well. She has not checked her blood pressure since her medication changes. She reports that she has not tried any medications other than her current regimen.   She takes 800mg  of ibuprofen about 3 times per week.   Current HTN meds:  Amlodipine 5mg  daily in the morning Losartan 100mg  daily in the morning Metoprolol succinate 50mg  daily in the morning  Previously tried: none  BP goal: <130/80  Family History: Arthritis in her mother; Heart attack (age of onset: 71) in her sister; Hypertension in her brother, maternal grandmother, mother, sister, sister, and sister; Parkinsonism in her mother; Prostate cancer in her brother and father.   Social History: The patient  reports that she has never smoked. She has never used smokeless tobacco. She reports that she drinks alcohol. She reports that she does not use drugs.  Diet: She eats from out and from home. She does eat at [employer], [employer], [employer]. She does not add salt to her food. She does eat vegetables regularly. She drinks mostly water with one cup of coffee per day.   Exercise: She does walk and weight exercises.   Home BP readings: Arm cuff at home, but has not been checking.  Wt Readings from Last 3 Encounters:  04/11/18 159 lb 1.9 oz (72.2 kg)  11/09/17 156 lb 11.2 oz (71.1 kg)  10/17/17 160 lb 9.6 oz (72.8 kg)   BP Readings from Last 3 Encounters:  05/03/18 (!) 152/78  04/11/18 (!) 156/84  11/09/17 140/72   Pulse Readings from Last 3 Encounters:  05/03/18 (!) 55    04/11/18 (!) 54  11/09/17 62    Renal function: CrCl cannot be calculated (Patient's most recent lab result is older than the maximum 21 days allowed.).  Past Medical History:  Diagnosis Date  . Acute meniscal tear of left knee   . Apical variant hypertrophic cardiomyopathy ([place]) no episodes of syncope, no family hx sudden cardiac death, and no palpitations:  holter monitor 10-27-2017 showed PVC"s/ PAC's   cardiologist-  dr Anna [name]--- per lov note, dated 10-17-2017-- Cardiac CT (08-24-2017) calcium 0, normal coronary orgin with right dominance, no evidence CAD, there is severe LVH predominantly in the apical segments that are obliterating LV cavity, no apical aneurysm is seen:  symptomatic with chest tightness and sob with activity/  . Atypical chest pain    Negative nuclear stress test , done 03-17-2006,  referr to cardiologist notes by dr Anna [name] in epic  . Chronic cholecystitis with calculus 02/02/2016  . Dyspnea 02/08/2013   Evaluated by Dr Anna [name]  Pulmonary function test normal except for mild decrease in diffusing capacity  Normal ventilation/perfusion scan  Resolution without treatment.   Marland Kitchen Dysrhythmia   . Essential hypertension 04/05/2007   Qualifier: Diagnosis of  By: Anna Darner MD, Gwyndolyn Saxon    . Family history of adverse reaction to anesthesia    pts  sister - severe PONV  . Floaters in visual field, bilateral   . Hemorrhoids, internal    and external  . Hiatal hernia   . History of hepatitis C    dx 01/ 2017--  followed by infectious disease, dr comer,  Genotype 1b, F2/3 per elastography-- pt completed harvoni treatment 05/ 2017, now cured;  last [place] abd/ elastography 08-24-2016 F0/1  . History of uterine fibroid   . Hypertension   . Liver fibrosis 09/03/2015   F2/3   . OA (osteoarthritis)    left knee, hands  . Pre-diabetes   . S/P dilatation of esophageal stricture   . Seasonal allergic rhinitis   . SOB (shortness of breath) on exertion   . Vitamin D deficiency     Current  Outpatient Medications on File Prior to Visit  Medication Sig Dispense Refill  . losartan (COZAAR) 100 MG tablet Take 1 tablet (100 mg total) by mouth daily. 90 tablet 3  . metoprolol succinate (TOPROL-XL) 50 MG 24 hr tablet Take 1 tablet (50 mg total) by mouth daily. 90 tablet 3   No current facility-administered medications on file prior to visit.     Allergies  Allergen Reactions  . Tramadol     SEVERE HEADACHES , dizzy, nausea    Blood pressure (!) 152/78, pulse (!) 55.   Assessment/Plan: Hypertension: BP today is elevated above goal. Will increase her amlodipine to 10mg  daily. Have asked that she monitor her pressure over the next few weeks and bring log or cuff to follow up visit in 4-6 weeks. If pressure still not controlled would consider addition of diuretic. We also reiterated lifestyle modifications to help with blood pressures.    Thank you, Anna [name], [facility]  05/04/2018 8:38 AM

## 2018-05-03 NOTE — Patient Instructions (Signed)
Return for a follow up appointment in 4-6 weeks  Your blood pressure goal is less than 130/80  Check your blood pressure at home daily (if able) and keep record of the readings.  Take your BP meds as follows: INCREASE amlodipine to 10mg  daily (you may take 2 tablets of your current supply until you run out)  Continue all other medications as prescribed.   Bring all of your meds, your BP cuff and your record of home blood pressures to your next appointment.  Exercise as you're able, try to walk approximately 30 minutes per day.  Keep salt intake to a minimum, especially watch canned and prepared boxed foods.  Eat more fresh fruits and vegetables and fewer canned items.  Avoid eating in fast food restaurants.    HOW TO TAKE YOUR BLOOD PRESSURE: . Rest 5 minutes before taking your blood pressure. .  Don't smoke or drink caffeinated beverages for at least 30 minutes before. . Take your blood pressure before (not after) you eat. . Sit comfortably with your back supported and both feet on the floor (don't cross your legs). . Elevate your arm to heart level on a table or a desk. . Use the proper sized cuff. It should fit smoothly and snugly around your bare upper arm. There should be enough room to slip a fingertip under the cuff. The bottom edge of the cuff should be 1 inch above the crease of the elbow. . Ideally, take 3 measurements at one sitting and record the average.

## 2018-05-05 NOTE — Progress Notes (Addendum)
[place] [facility] [address], [place], [place] [address] [phone]  Date:  05/09/2018   Name:  Anna [name]   DOB:  1950-07-28   MRN:  [id number]  PCP:  Darreld Mclean, MD    Chief Complaint: itching (at night hands and feet itch, bumps come and go, slight redness)   History of Present Illness:  Anna [name] is a 67 y.o. very pleasant female patient who presents with the following:  Here today to discuss itching- she has noted this for about one week Seemed to start with her ears, she thought allergies at first.  However the itching seems to have spreak At night her hands will itch, feet as well  Also her back  She has not really noted any rash- may occasionally have a little bump come up but they never last  She did have dry skin and itching in the past- however we don't think she has dry skin this time No new pets, detergents, foods Her amlodipine dose was increased from 5- 10 but no new meds or other changes  She has a history of hep c, cured, and cholecystectomy  She lives alone History of dyslipidemia, HTN, cardiomyopathy She is seeing me soon for a CPE   Recent cardiology visit: ASSESSMENT AND PLAN: 1.  Atypical hypertrophy cardiomyopathy with no CAD - BP is improved from last visit ,  Though still higher than we would like - she will decrease salt in diet and come back in 3 weeks for BP check if still elevated would increase amlodipine, her BB is at highest dose with HR.   2.  HLD, last LDL 137 would like closer to 100, she will adjust diet further and follow with PCP.   3.  HTN as above,  Follow up with Dr. Marlou Porch in 6 months.    Patient Active Problem List   Diagnosis Date Noted  . S/P arthroscopy of left knee 11/09/2017  . Apical variant hypertrophic cardiomyopathy ([place]) 10/17/2017  . Vitamin D deficiency   . Tinnitus   . Rhinitis   . Hypertension   . Hiatal hernia   . Hemorrhoids, internal    . Hematuria, microscopic   . GERD (gastroesophageal reflux disease)   . Floaters   . Family history of adverse reaction to anesthesia   . Dysrhythmia   . Dry eyes   . Arthritis   . Atypical chest pain   . Pre-diabetes 03/29/2016  . Cholelithiasis with chronic cholecystitis 02/04/2016  . Chronic cholecystitis with calculus 02/02/2016  . Esophagitis 09/03/2015  . Liver fibrosis 09/03/2015  . History of hepatitis C- cured 2017 07/22/2015  . Hepatitis C 06/18/2015  . B12 deficiency 11/07/2014  . Plantar fasciitis of right foot 08/13/2014  . Deformity of metatarsal bone of right foot 08/13/2014  . Dyspnea 02/08/2013  . HSV antigen DIF positive 02/09/2011  . NONSPEC ELEVATION OF LEVELS OF TRANSAMINASE/LDH 04/16/2010  . ESOPHAGEAL STRICTURE 02/05/2010  . CHOLELITHIASIS 08/05/2008  . Essential hypertension 04/05/2007  . Nonspecific abnormal electrocardiogram (ECG) (EKG) 03/13/2007    Past Medical History:  Diagnosis Date  . Acute meniscal tear of left knee   . Apical variant hypertrophic cardiomyopathy ([place]) no episodes of syncope, no family hx sudden cardiac death, and no palpitations:  holter monitor 10-27-2017 showed PVC"s/ PAC's   cardiologist-  dr Marlou Porch--- per lov note, dated 10-17-2017-- Cardiac CT (08-24-2017) calcium 0, normal coronary orgin with right dominance, no  evidence CAD, there is severe LVH predominantly in the apical segments that are obliterating LV cavity, no apical aneurysm is seen:  symptomatic with chest tightness and sob with activity/  . Atypical chest pain    Negative nuclear stress test , done 03-17-2006,  referr to cardiologist notes by dr Marlou Porch in epic  . Chronic cholecystitis with calculus 02/02/2016  . Dyspnea 02/08/2013   Evaluated by Dr Gwenette Greet  Pulmonary function test normal except for mild decrease in diffusing capacity  Normal ventilation/perfusion scan  Resolution without treatment.   Marland Kitchen Dysrhythmia   . Essential hypertension 04/05/2007   Qualifier:  Diagnosis of  By: Linna Darner MD, Gwyndolyn Saxon    . Family history of adverse reaction to anesthesia    pts sister - severe PONV  . Floaters in visual field, bilateral   . Hemorrhoids, internal    and external  . Hiatal hernia   . History of hepatitis C    dx 01/ 2017--  followed by infectious disease, dr comer,  Genotype 1b, F2/3 per elastography-- pt completed harvoni treatment 05/ 2017, now cured;  last [place] abd/ elastography 08-24-2016 F0/1  . History of uterine fibroid   . Hypertension   . Liver fibrosis 09/03/2015   F2/3   . OA (osteoarthritis)    left knee, hands  . Pre-diabetes   . S/P dilatation of esophageal stricture   . Seasonal allergic rhinitis   . SOB (shortness of breath) on exertion   . Vitamin D deficiency     Past Surgical History:  Procedure Laterality Date  . ABDOMINAL HYSTERECTOMY  1994   w/ Unilateral Salpingoophorectomy   . APPENDECTOMY  age 74  . CARDIOVASCULAR STRESS TEST  03/17/2006   normal adenosine nuclear study , no exercise/ no evidence ischemia/  normal LV function and wall motion , ef 75%  . CHOLECYSTECTOMY N/A 02/04/2016   Procedure: LAPAROSCOPIC CHOLECYSTECTOMY ;  Surgeon: Armandina Gemma, MD;  Location: [facility];  Service: General;  Laterality: N/A;  . CHONDROPLASTY Left 11/09/2017   Procedure: CHONDROPLASTY;  Surgeon: Sydnee Cabal, MD;  Location: [facility];  Service: Orthopedics;  Laterality: Left;  . COLONOSCOPY WITH ESOPHAGOGASTRODUODENOSCOPY (EGD)  last one 08-19-2015  dr Henrene Pastor  . DILATION AND CURETTAGE OF UTERUS  1973  . DOBUTAMINE STRESS ECHO  03-05-2010   dr Stanford Breed   normal stress echo w/ normal LVEF   . KNEE ARTHROSCOPY Left 12/2016  . KNEE ARTHROSCOPY WITH MEDIAL MENISECTOMY Left 11/09/2017   Procedure: LEFT KNEE ARTHROSCOPY, DEBRIDEMENT WITH PARTIAL MEDIAL AND LATERAL MENISECTOMY, CHONDROPLASTY, ARTHROSCOPIC ASSISTED INTERNAL FIXATION MEDIAL TIBIAL PLATEAU FRACTURE;  Surgeon: Sydnee Cabal, MD;  Location: [facility];  Service: Orthopedics;  Laterality: Left;  . TRANSTHORACIC ECHOCARDIOGRAM  06/15/2017    dr Marlou Porch   mild LVH, ef 19-50%, grade 1 diastolic dysfunction/  mild LAE/  aneurysmal interatrial septum-- cannot exclude PFO/  trivial TR/ impressions: there is severe hypertrophy of the mid and apical segments highly suspicious for an apical form of hypertrophic cardiomyopathy    Social History   Tobacco Use  . Smoking status: Never Smoker  . Smokeless tobacco: Never Used  Substance Use Topics  . Alcohol use: Yes    Alcohol/week: 0.0 standard drinks    Comment:   occasionally  . Drug use: No    Family History  Problem Relation Age of Onset  . Hypertension Mother   . Parkinsonism Mother   . Arthritis Mother        OA  .  Prostate cancer Father   . Hypertension Sister   . Hypertension Brother   . Prostate cancer Brother   . Hypertension Maternal Grandmother   . Hypertension Sister   . Hypertension Sister   . Heart attack Sister 44       smoker  . Diabetes Neg Hx   . Stroke Neg Hx   . Colon cancer Neg Hx   . Stomach cancer Neg Hx     Allergies  Allergen Reactions  . Tramadol     SEVERE HEADACHES , dizzy, nausea    Medication list has been reviewed and updated.  Current Outpatient Medications on File Prior to Visit  Medication Sig Dispense Refill  . amLODipine (NORVASC) 10 MG tablet Take 1 tablet (10 mg total) by mouth daily.    Marland Kitchen losartan (COZAAR) 100 MG tablet Take 1 tablet (100 mg total) by mouth daily. 90 tablet 3  . metoprolol succinate (TOPROL-XL) 50 MG 24 hr tablet Take 1 tablet (50 mg total) by mouth daily. 90 tablet 3   No current facility-administered medications on file prior to visit.     Review of Systems:  As per HPI- otherwise negative. No fever or chills No vomiting  Her eyes have been a little itchy No sneezing or itchy nose    Physical Examination: Vitals:   05/09/18 1604  BP: 126/80  Pulse: 83  Resp: 16  Temp: 97.7 F (36.5 C)  SpO2:  98%   Vitals:   05/09/18 1604  Weight: 161 lb 6.4 oz (73.2 kg)  Height: 5\' 2"  (1.575 m)   Body mass index is 29.52 kg/m. Ideal Body Weight: Weight in (lb) to have BMI = 25: 136.4  GEN: WDWN, NAD, Non-toxic, A & O x 3 HEENT: Atraumatic, Normocephalic. Neck supple. No masses, No LAD. Bilateral TM wnl, oropharynx normal.  PEERL,EOMI.   No oral lesions Normal weight  Ears and Nose: No external deformity. CV: RRR, No M/G/R. No JVD. No thrill. No extra heart sounds. PULM: CTA B, no wheezes, crackles, rhonchi. No retractions. No resp. distress. No accessory muscle use. ABD: S, NT, ND. No rebound. No HSM. EXTR: No c/c/e NEURO Normal gait.  PSYCH: Normally interactive. Conversant. Not depressed or anxious appearing.  Calm demeanor.  Looks well No rash or other skin findings apparent    Assessment and Plan: Itching - Plan: CBC, Comprehensive metabolic panel, Sedimentation rate  Itching- labs as above to look for any apparent cause Antihistamines as needed for now Will plan further follow- up pending labs.  We are going to look for any cause of itching in your labs- I will be in touch with your results asap Try a non -sedating antihistamine such as claritin or zyrtec in the am You can then use benadryl at night if needed- this will make you drowsy however!  Let me know if any changes or worsening    Signed Lamar Blinks, MD  Received her labs 10/24- called and LMOM, all labs are normal Let me know if itching is not better Copy to pt  Results for orders placed or performed in visit on 05/09/18  CBC  Result Value Ref Range   WBC 6.6 4.0 - 10.5 K/uL   RBC 4.79 3.87 - 5.11 Mil/uL   Platelets 208.0 150.0 - 400.0 K/uL   Hemoglobin 14.3 12.0 - 15.0 g/dL   HCT 42.3 36.0 - 46.0 %   MCV 88.3 78.0 - 100.0 fl   MCHC 33.9 30.0 - 36.0 g/dL   RDW  14.0 11.5 - 15.5 %  Comprehensive metabolic panel  Result Value Ref Range   Sodium 139 135 - 145 mEq/L   Potassium 3.8 3.5 - 5.1 mEq/L    Chloride 102 96 - 112 mEq/L   CO2 30 19 - 32 mEq/L   Glucose, Bld 105 (H) 70 - 99 mg/dL   BUN 9 6 - 23 mg/dL   Creatinine, Ser 0.94 0.40 - 1.20 mg/dL   Total Bilirubin 0.6 0.2 - 1.2 mg/dL   Alkaline Phosphatase 51 39 - 117 U/L   AST 15 0 - 37 U/L   ALT 13 0 - 35 U/L   Total Protein 7.6 6.0 - 8.3 g/dL   Albumin 4.8 3.5 - 5.2 g/dL   Calcium 9.6 8.4 - 10.5 mg/dL   GFR 76.72 >60.00 mL/min  Sedimentation rate  Result Value Ref Range   Sed Rate 8 0 - 30 mm/hr

## 2018-05-09 ENCOUNTER — Ambulatory Visit (INDEPENDENT_AMBULATORY_CARE_PROVIDER_SITE_OTHER): Payer: BLUE CROSS/BLUE SHIELD | Admitting: Family Medicine

## 2018-05-09 ENCOUNTER — Encounter: Payer: Self-pay | Admitting: Family Medicine

## 2018-05-09 VITALS — BP 126/80 | HR 83 | Temp 97.7°F | Resp 16 | Ht 62.0 in | Wt 161.4 lb

## 2018-05-09 DIAGNOSIS — L299 Pruritus, unspecified: Secondary | ICD-10-CM | POA: Diagnosis not present

## 2018-05-09 NOTE — Patient Instructions (Signed)
We are going to look for any cause of itching in your labs- I will be in touch with your results asap Try a non -sedating antihistamine such as claritin or zyrtec in the am You can then use benadryl at night if needed- this will make you drowsy however!  Let me know if any changes or worsening

## 2018-05-10 LAB — SEDIMENTATION RATE: SED RATE: 8 mm/h (ref 0–30)

## 2018-05-10 LAB — COMPREHENSIVE METABOLIC PANEL
ALBUMIN: 4.8 g/dL (ref 3.5–5.2)
ALK PHOS: 51 U/L (ref 39–117)
ALT: 13 U/L (ref 0–35)
AST: 15 U/L (ref 0–37)
BILIRUBIN TOTAL: 0.6 mg/dL (ref 0.2–1.2)
BUN: 9 mg/dL (ref 6–23)
CALCIUM: 9.6 mg/dL (ref 8.4–10.5)
CO2: 30 mEq/L (ref 19–32)
Chloride: 102 mEq/L (ref 96–112)
Creatinine, Ser: 0.94 mg/dL (ref 0.40–1.20)
GFR: 76.72 mL/min (ref >60.00)
Glucose, Bld: 105 mg/dL — ABNORMAL HIGH (ref 70–99)
POTASSIUM: 3.8 meq/L (ref 3.5–5.1)
Sodium: 139 mEq/L (ref 135–145)
TOTAL PROTEIN: 7.6 g/dL (ref 6.0–8.3)

## 2018-05-10 LAB — CBC
HEMATOCRIT: 42.3 % (ref 36.0–46.0)
HEMOGLOBIN: 14.3 g/dL (ref 12.0–15.0)
MCHC: 33.9 g/dL (ref 30.0–36.0)
MCV: 88.3 fl (ref 78.0–100.0)
PLATELETS: 208 10*3/uL (ref 150.0–400.0)
RBC: 4.79 Mil/uL (ref 3.87–5.11)
RDW: 14 % (ref 11.5–15.5)
WBC: 6.6 10*3/uL (ref 4.0–10.5)

## 2018-05-14 IMAGING — US US ABDOMEN COMPLETE
1 series · 13 of 25 positions shown · non-contrast
Comparison: CT abdomen pelvis 05/06/2016.

CLINICAL DATA: Right upper quadrant abdominal pain. Right lower
abdominal pain. Cholecystectomy 3 months ago.

EXAM:
ABDOMEN ULTRASOUND COMPLETE

[Series 1: us abdomen complete · 0.21mm/px · 13 of 81 slices shown]
[im 1/81]
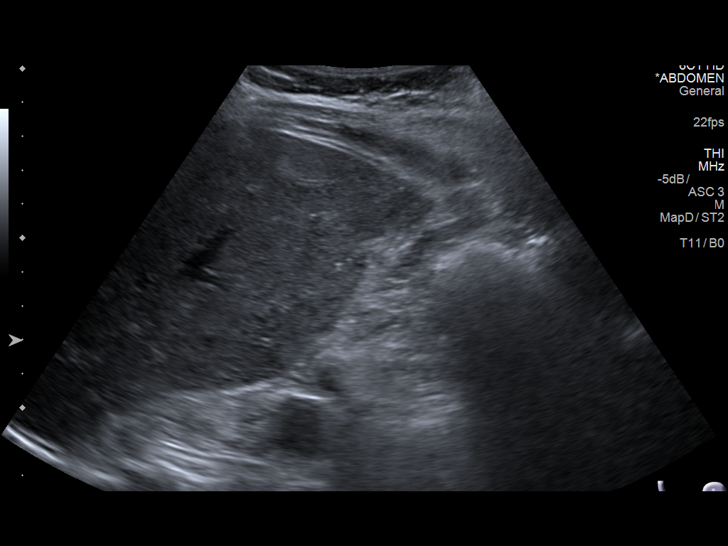
[im 7/81]
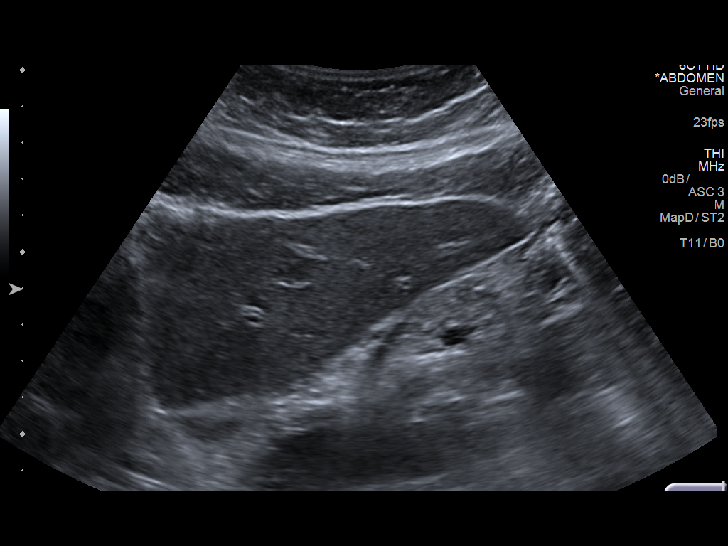
[im 14/81]
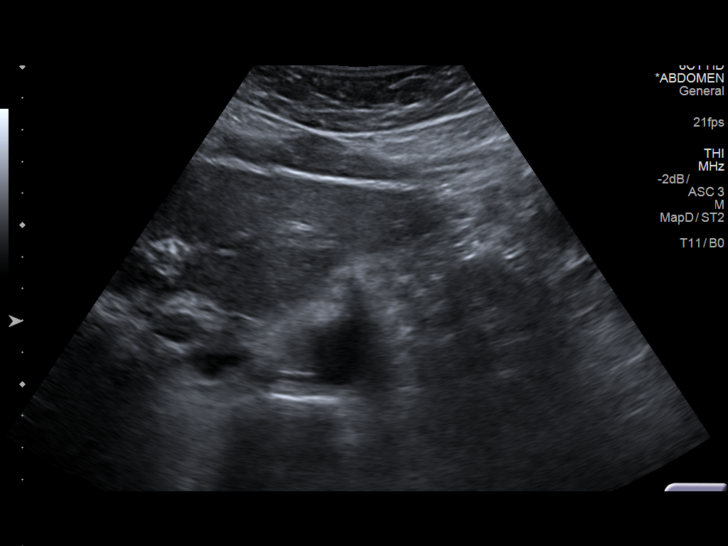
[im 21/81]
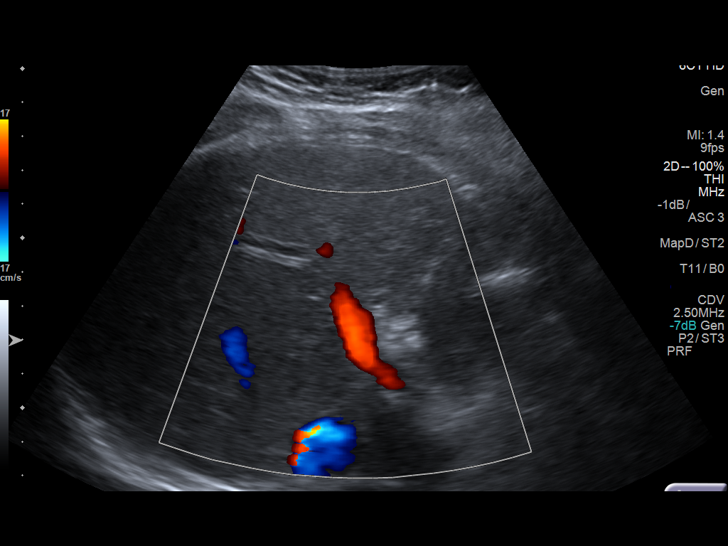
[im 27/81]
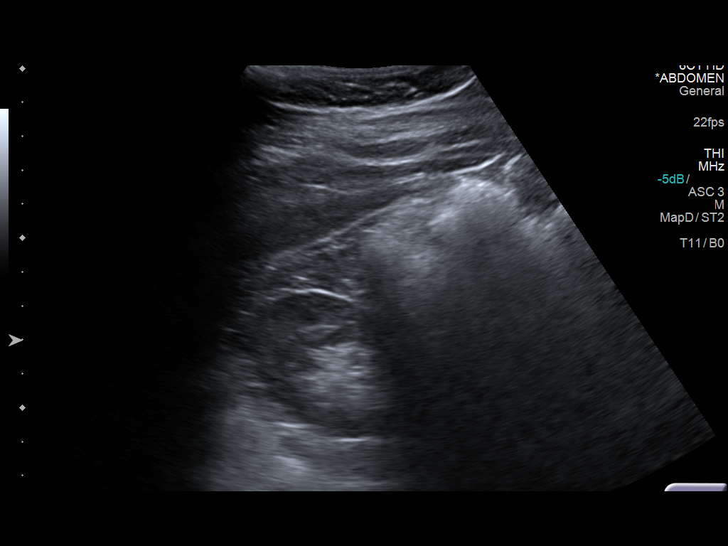
[im 34/81]
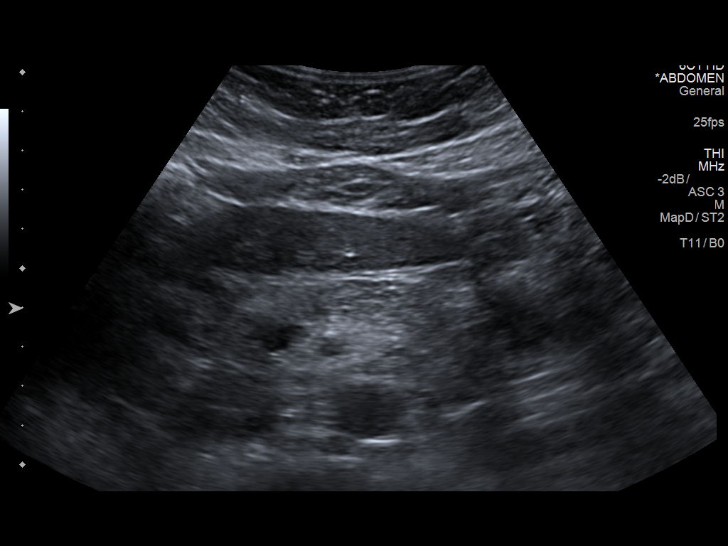
[im 41/81]
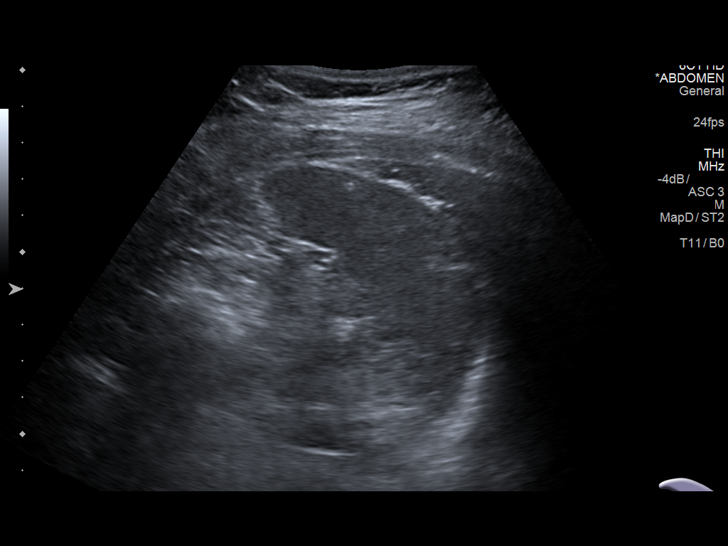
[im 47/81]
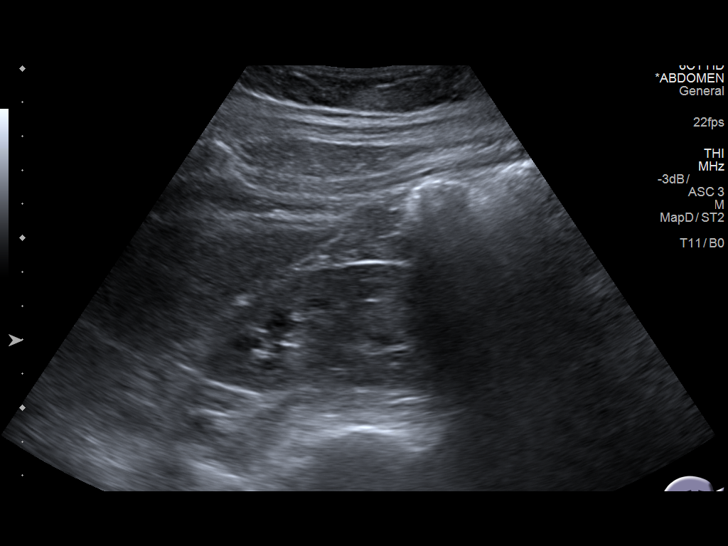
[im 54/81]
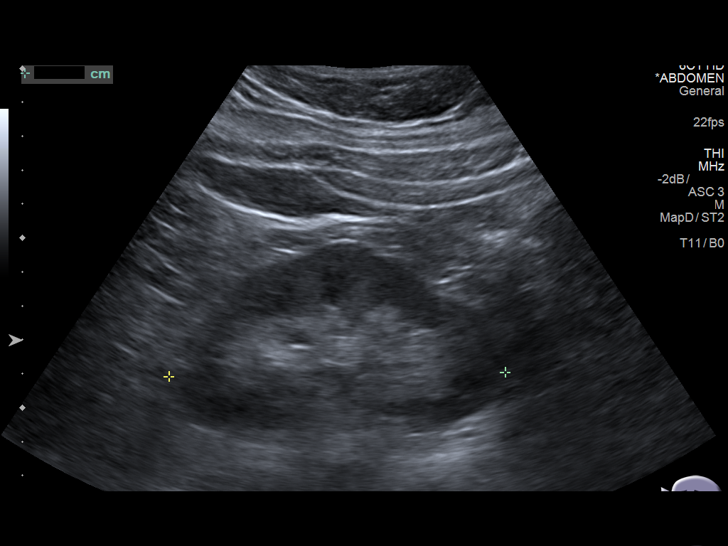
[im 61/81]
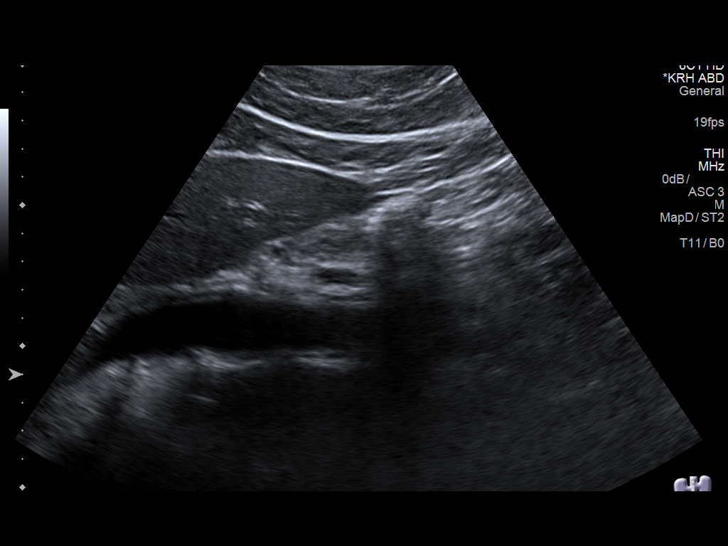
[im 67/81]
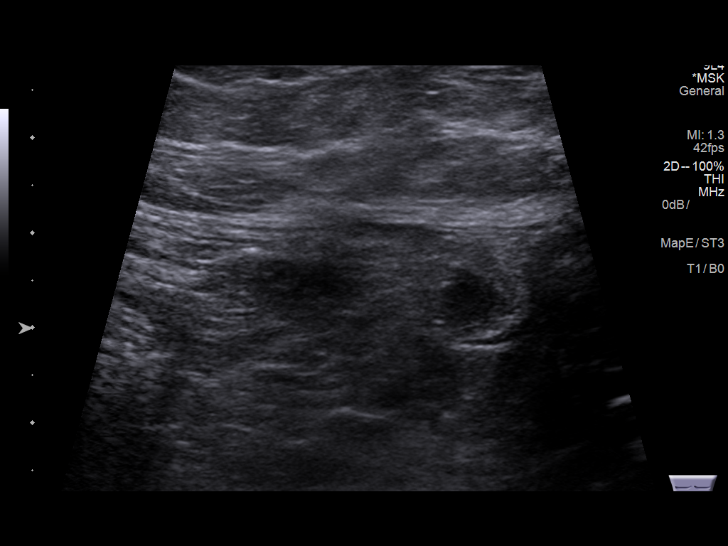
[im 74/81]
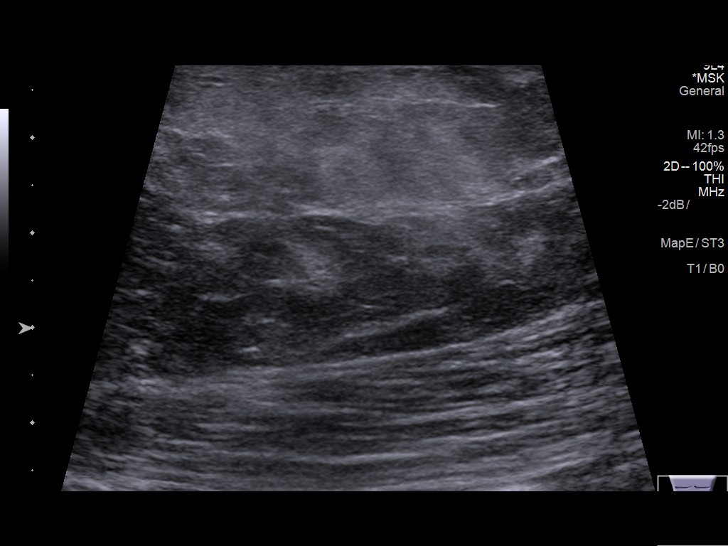
[im 81/81]
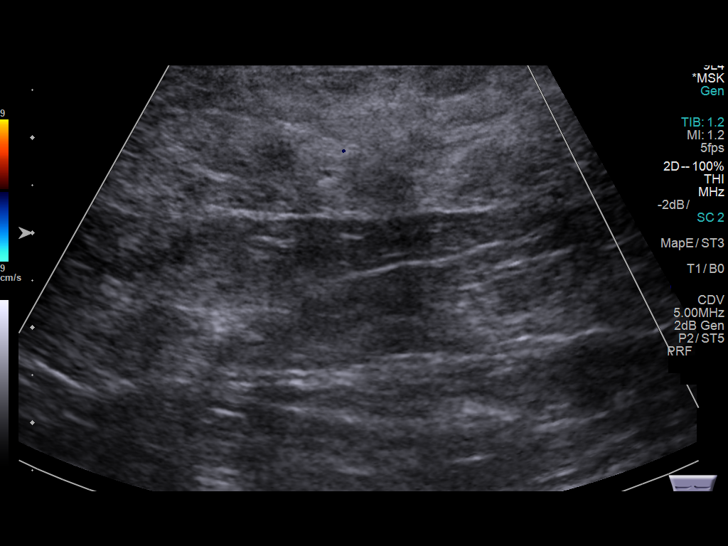

[13 of 25 positions shown; findings below may reference images not displayed]

FINDINGS: Gallbladder: Surgically absent.

Common bile duct: Diameter: 4 mm, within normal limits

Liver: The liver is mildly hyperechoic. No focal lesions are
present.

IVC: No abnormality visualized.

Pancreas: Visualized portion unremarkable.

Spleen: Normal size and echotexture at 6.2 cm.

Right Kidney: Length: 10.5 cm, within normal limits. Echogenicity
within normal limits. No mass or hydronephrosis visualized.

Left Kidney: Length: 9.9 cm, within normal limits. Echogenicity
within normal limits. No mass or hydronephrosis visualized.

Abdominal aorta: No aneurysm visualized.

Other findings: Scanning over areas are bruising in the lower
abdomen demonstrates some patchy increased echogenicity within the
subcutaneous fat.
IMPRESSION: 1. Cholecystectomy.
2. No acute or focal abnormality to explain right upper quadrant
pain.
3. Hepatic steatosis.
4. Ill-defined increased echogenicity within subcutaneous fat may
represent focal inflammation, panniculitis, or edema from recent
injury. There was some stranding in this area on the recent CT scan
which appears inflammatory. There is no discrete abscess.
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
## 2018-05-23 ENCOUNTER — Ambulatory Visit (INDEPENDENT_AMBULATORY_CARE_PROVIDER_SITE_OTHER): Payer: BLUE CROSS/BLUE SHIELD | Admitting: Family Medicine

## 2018-05-23 ENCOUNTER — Encounter: Payer: Self-pay | Admitting: Family Medicine

## 2018-05-23 VITALS — BP 122/70 | HR 74 | Temp 99.0°F | Resp 16 | Ht 62.0 in | Wt 161.8 lb

## 2018-05-23 DIAGNOSIS — I10 Essential (primary) hypertension: Secondary | ICD-10-CM

## 2018-05-23 DIAGNOSIS — Z13220 Encounter for screening for lipoid disorders: Secondary | ICD-10-CM

## 2018-05-23 DIAGNOSIS — F4322 Adjustment disorder with anxiety: Secondary | ICD-10-CM

## 2018-05-23 DIAGNOSIS — Z5181 Encounter for therapeutic drug level monitoring: Secondary | ICD-10-CM | POA: Diagnosis not present

## 2018-05-23 DIAGNOSIS — E559 Vitamin D deficiency, unspecified: Secondary | ICD-10-CM | POA: Diagnosis not present

## 2018-05-23 DIAGNOSIS — L299 Pruritus, unspecified: Secondary | ICD-10-CM

## 2018-05-23 DIAGNOSIS — R7303 Prediabetes: Secondary | ICD-10-CM | POA: Diagnosis not present

## 2018-05-23 DIAGNOSIS — Z0000 Encounter for general adult medical examination without abnormal findings: Secondary | ICD-10-CM

## 2018-05-23 LAB — LIPID PANEL
CHOL/HDL RATIO: 4
Cholesterol: 216 mg/dL — ABNORMAL HIGH (ref 0–200)
HDL: 61.7 mg/dL (ref >39.00)
LDL Cholesterol: 122 mg/dL — ABNORMAL HIGH (ref 0–99)
NonHDL: 154.7
Triglycerides: 165 mg/dL — ABNORMAL HIGH (ref 0.0–149.0)
VLDL: 33 mg/dL (ref 0.0–40.0)

## 2018-05-23 LAB — TSH: TSH: 1.13 u[IU]/mL (ref 0.35–4.50)

## 2018-05-23 LAB — HEMOGLOBIN A1C: HEMOGLOBIN A1C: 5.8 % (ref 4.6–6.5)

## 2018-05-23 LAB — FERRITIN: FERRITIN: 44.1 ng/mL (ref 10.0–291.0)

## 2018-05-23 LAB — VITAMIN D 25 HYDROXY (VIT D DEFICIENCY, FRACTURES): VITD: 16.03 ng/mL — ABNORMAL LOW (ref 30.00–100.00)

## 2018-05-23 MED ORDER — HYDROXYZINE HCL 10 MG PO TABS: 10.0000 mg | ORAL_TABLET | Freq: Three times a day (TID) | ORAL | 1 refills | Status: DC | PRN

## 2018-05-23 MED FILL — hydrOXYzine HCL 10 MG TABS: 10 | 10 days supply | Qty: 60 | Fill #0

## 2018-05-23 NOTE — Patient Instructions (Addendum)
Good to see you today- I will be in touch with your labs asap  Your BP looks fine! I do recommend immunizations for flu, pneumonia and tetanus  Let's try hydroxyzine as needed for itching AND anxiety. This med can make you feel a bit drowsy so be aware of this.  Please let me know if the itching and/ or the anxiety are not improved soon   Health Maintenance for Postmenopausal Women Menopause is a normal process in which your reproductive ability comes to an end. This process happens gradually over a span of months to years, usually between the ages of 61 and 50. Menopause is complete when you have missed 12 consecutive menstrual periods. It is important to talk with your health care provider about some of the most common conditions that affect postmenopausal women, such as heart disease, cancer, and bone loss (osteoporosis). Adopting a healthy lifestyle and getting preventive care can help to promote your health and wellness. Those actions can also lower your chances of developing some of these common conditions. What should I know about menopause? During menopause, you may experience a number of symptoms, such as:  Moderate-to-severe hot flashes.  Night sweats.  Decrease in sex drive.  Mood swings.  Headaches.  Tiredness.  Irritability.  Memory problems.  Insomnia.  Choosing to treat or not to treat menopausal changes is an individual decision that you make with your health care provider. What should I know about hormone replacement therapy and supplements? Hormone therapy products are effective for treating symptoms that are associated with menopause, such as hot flashes and night sweats. Hormone replacement carries certain risks, especially as you become older. If you are thinking about using estrogen or estrogen with progestin treatments, discuss the benefits and risks with your health care provider. What should I know about heart disease and stroke? Heart disease, heart  attack, and stroke become more likely as you age. This may be due, in part, to the hormonal changes that your body experiences during menopause. These can affect how your body processes dietary fats, triglycerides, and cholesterol. Heart attack and stroke are both medical emergencies. There are many things that you can do to help prevent heart disease and stroke:  Have your blood pressure checked at least every 1-2 years. High blood pressure causes heart disease and increases the risk of stroke.  If you are 14-37 years old, ask your health care provider if you should take aspirin to prevent a heart attack or a stroke.  Do not use any tobacco products, including cigarettes, chewing tobacco, or electronic cigarettes. If you need help quitting, ask your health care provider.  It is important to eat a healthy diet and maintain a healthy weight. ? Be sure to include plenty of vegetables, fruits, low-fat dairy products, and lean protein. ? Avoid eating foods that are high in solid fats, added sugars, or salt (sodium).  Get regular exercise. This is one of the most important things that you can do for your health. ? Try to exercise for at least 150 minutes each week. The type of exercise that you do should increase your heart rate and make you sweat. This is known as moderate-intensity exercise. ? Try to do strengthening exercises at least twice each week. Do these in addition to the moderate-intensity exercise.  Know your numbers.Ask your health care provider to check your cholesterol and your blood glucose. Continue to have your blood tested as directed by your health care provider.  What should I know  about cancer screening? There are several types of cancer. Take the following steps to reduce your risk and to catch any cancer development as early as possible. Breast Cancer  Practice breast self-awareness. ? This means understanding how your breasts normally appear and feel. ? It also means  doing regular breast self-exams. Let your health care provider know about any changes, no matter how small.  If you are 77 or older, have a clinician do a breast exam (clinical breast exam or CBE) every year. Depending on your age, family history, and medical history, it may be recommended that you also have a yearly breast X-ray (mammogram).  If you have a family history of breast cancer, talk with your health care provider about genetic screening.  If you are at high risk for breast cancer, talk with your health care provider about having an MRI and a mammogram every year.  Breast cancer (BRCA) gene test is recommended for women who have family members with BRCA-related cancers. Results of the assessment will determine the need for genetic counseling and BRCA1 and for BRCA2 testing. BRCA-related cancers include these types: ? Breast. This occurs in males or females. ? Ovarian. ? Tubal. This may also be called fallopian tube cancer. ? Cancer of the abdominal or pelvic lining (peritoneal cancer). ? Prostate. ? Pancreatic.  Cervical, Uterine, and Ovarian Cancer Your health care provider may recommend that you be screened regularly for cancer of the pelvic organs. These include your ovaries, uterus, and vagina. This screening involves a pelvic exam, which includes checking for microscopic changes to the surface of your cervix (Pap test).  For women ages 21-65, health care providers may recommend a pelvic exam and a Pap test every three years. For women ages 27-65, they may recommend the Pap test and pelvic exam, combined with testing for human papilloma virus (HPV), every five years. Some types of HPV increase your risk of cervical cancer. Testing for HPV may also be done on women of any age who have unclear Pap test results.  Other health care providers may not recommend any screening for nonpregnant women who are considered low risk for pelvic cancer and have no symptoms. Ask your health care  provider if a screening pelvic exam is right for you.  If you have had past treatment for cervical cancer or a condition that could lead to cancer, you need Pap tests and screening for cancer for at least 20 years after your treatment. If Pap tests have been discontinued for you, your risk factors (such as having a new sexual partner) need to be reassessed to determine if you should start having screenings again. Some women have medical problems that increase the chance of getting cervical cancer. In these cases, your health care provider may recommend that you have screening and Pap tests more often.  If you have a family history of uterine cancer or ovarian cancer, talk with your health care provider about genetic screening.  If you have vaginal bleeding after reaching menopause, tell your health care provider.  There are currently no reliable tests available to screen for ovarian cancer.  Lung Cancer Lung cancer screening is recommended for adults 55-60 years old who are at high risk for lung cancer because of a history of smoking. A yearly low-dose CT scan of the lungs is recommended if you:  Currently smoke.  Have a history of at least 30 pack-years of smoking and you currently smoke or have quit within the past 15 years. A pack-year  is smoking an average of one pack of cigarettes per day for one year.  Yearly screening should:  Continue until it has been 15 years since you quit.  Stop if you develop a health problem that would prevent you from having lung cancer treatment.  Colorectal Cancer  This type of cancer can be detected and can often be prevented.  Routine colorectal cancer screening usually begins at age 68 and continues through age 31.  If you have risk factors for colon cancer, your health care provider may recommend that you be screened at an earlier age.  If you have a family history of colorectal cancer, talk with your health care provider about genetic  screening.  Your health care provider may also recommend using home test kits to check for hidden blood in your stool.  A small camera at the end of a tube can be used to examine your colon directly (sigmoidoscopy or colonoscopy). This is done to check for the earliest forms of colorectal cancer.  Direct examination of the colon should be repeated every 5-10 years until age 72. However, if early forms of precancerous polyps or small growths are found or if you have a family history or genetic risk for colorectal cancer, you may need to be screened more often.  Skin Cancer  Check your skin from head to toe regularly.  Monitor any moles. Be sure to tell your health care provider: ? About any new moles or changes in moles, especially if there is a change in a mole's shape or color. ? If you have a mole that is larger than the size of a pencil eraser.  If any of your family members has a history of skin cancer, especially at a young age, talk with your health care provider about genetic screening.  Always use sunscreen. Apply sunscreen liberally and repeatedly throughout the day.  Whenever you are outside, protect yourself by wearing long sleeves, pants, a wide-brimmed hat, and sunglasses.  What should I know about osteoporosis? Osteoporosis is a condition in which bone destruction happens more quickly than new bone creation. After menopause, you may be at an increased risk for osteoporosis. To help prevent osteoporosis or the bone fractures that can happen because of osteoporosis, the following is recommended:  If you are 32-43 years old, get at least 1,000 mg of calcium and at least 600 mg of vitamin D per day.  If you are older than age 32 but younger than age 80, get at least 1,200 mg of calcium and at least 600 mg of vitamin D per day.  If you are older than age 31, get at least 1,200 mg of calcium and at least 800 mg of vitamin D per day.  Smoking and excessive alcohol intake  increase the risk of osteoporosis. Eat foods that are rich in calcium and vitamin D, and do weight-bearing exercises several times each week as directed by your health care provider. What should I know about how menopause affects my mental health? Depression may occur at any age, but it is more common as you become older. Common symptoms of depression include:  Low or sad mood.  Changes in sleep patterns.  Changes in appetite or eating patterns.  Feeling an overall lack of motivation or enjoyment of activities that you previously enjoyed.  Frequent crying spells.  Talk with your health care provider if you think that you are experiencing depression. What should I know about immunizations? It is important that you get and  maintain your immunizations. These include:  Tetanus, diphtheria, and pertussis (Tdap) booster vaccine.  Influenza every year before the flu season begins.  Pneumonia vaccine.  Shingles vaccine.  Your health care provider may also recommend other immunizations. This information is not intended to replace advice given to you by your health care provider. Make sure you discuss any questions you have with your health care provider. Document Released: 08/26/2005 Document Revised: 01/22/2016 Document Reviewed: 04/07/2015 Elsevier Interactive Patient Education  2018 [employer].

## 2018-05-24 MED ORDER — VITAMIN D (ERGOCALCIFEROL) 1.25 MG (50000 UNIT) PO CAPS: 50000.0000 [IU] | ORAL_CAPSULE | ORAL | 0 refills | Status: AC

## 2018-05-24 MED FILL — VIT D2 1.25 MG (50,000 UNIT: 1.25 MG | 28 days supply | Qty: 4 | Fill #0

## 2018-05-24 NOTE — Addendum Note (Signed)
Addended by: Lamar Blinks C on: 05/24/2018 08:42 AM   Modules accepted: Orders

## 2018-05-30 ENCOUNTER — Ambulatory Visit: Payer: BLUE CROSS/BLUE SHIELD | Admitting: Internal Medicine

## 2018-06-01 ENCOUNTER — Encounter

## 2018-06-01 ENCOUNTER — Ambulatory Visit (INDEPENDENT_AMBULATORY_CARE_PROVIDER_SITE_OTHER): Payer: BLUE CROSS/BLUE SHIELD | Admitting: Internal Medicine

## 2018-06-01 ENCOUNTER — Encounter: Payer: Self-pay | Admitting: Internal Medicine

## 2018-06-01 VITALS — BP 118/80 | HR 72 | Ht 62.0 in | Wt 161.4 lb

## 2018-06-01 DIAGNOSIS — R197 Diarrhea, unspecified: Secondary | ICD-10-CM

## 2018-06-01 DIAGNOSIS — R140 Abdominal distension (gaseous): Secondary | ICD-10-CM | POA: Diagnosis not present

## 2018-06-01 DIAGNOSIS — R143 Flatulence: Secondary | ICD-10-CM | POA: Diagnosis not present

## 2018-06-01 MED ORDER — RIFAXIMIN 550 MG PO TABS
550.0000 mg | ORAL_TABLET | Freq: Three times a day (TID) | ORAL | 0 refills | Status: AC
Start: 2018-06-01 — End: ?

## 2018-06-01 MED FILL — XIFAXAN 550 MG TABLET: 550 | 14 days supply | Qty: 42 | Fill #0

## 2018-06-01 NOTE — Progress Notes (Signed)
HISTORY OF PRESENT ILLNESS:  Anna [name] is a 67 y.o. female with past medical history as listed below who was last evaluated in this office Nov 16, 2015 regarding postprandial upper abdominal pain felt likely related to symptomatic gallstones, GERD, and post screening colonoscopy.  See that dictation.  She was prescribed omeprazole, sent to general surgery for possible laparoscopic cholecystectomy, and told to follow-up in 1 year.  She follows up at this time with a chief complaint of postprandial urgency with loose stools of 6 weeks duration.  Also foul-smelling flatus.  She did undergo cholecystectomy in July 2017.  No issues with her bowels thereafter.  Typically she would have 1 bowel movement per day.  Now experiencing 3 or 4 loose bowel movements per day.  She denies abdominal pain, bleeding, or weight loss.  No new medications.  No change in diet.  No longer taking PPI.  Does not report significant reflux symptoms.  Previous upper endoscopy did show esophagitis and stricture.  REVIEW OF SYSTEMS:  All non-GI ROS negative except for itching and arthritis  Past Medical History:  Diagnosis Date  . Acute meniscal tear of left knee   . Apical variant hypertrophic cardiomyopathy ([place]) no episodes of syncope, no family hx sudden cardiac death, and no palpitations:  holter monitor 10-27-2017 showed PVC"s/ PAC's   cardiologist-  dr Marlou Porch--- per lov note, dated 10-17-2017-- Cardiac CT (08-24-2017) calcium 0, normal coronary orgin with right dominance, no evidence CAD, there is severe LVH predominantly in the apical segments that are obliterating LV cavity, no apical aneurysm is seen:  symptomatic with chest tightness and sob with activity/  . Atypical chest pain    Negative nuclear stress test , done 03-17-2006,  referr to cardiologist notes by dr Marlou Porch in epic  . Chronic cholecystitis with calculus 02/02/2016  . Dyspnea 02/08/2013   Evaluated by Dr Gwenette Greet  Pulmonary function test normal except for  mild decrease in diffusing capacity  Normal ventilation/perfusion scan  Resolution without treatment.   Marland Kitchen Dysrhythmia   . Essential hypertension 04/05/2007   Qualifier: Diagnosis of  By: Linna Darner MD, Gwyndolyn Saxon    . Family history of adverse reaction to anesthesia    pts sister - severe PONV  . Floaters in visual field, bilateral   . Hemorrhoids, internal    and external  . Hiatal hernia   . History of hepatitis C    dx 01/ 2017--  followed by infectious disease, dr comer,  Genotype 1b, F2/3 per elastography-- pt completed harvoni treatment 05/ 2017, now cured;  last [place] abd/ elastography 08-24-2016 F0/1  . History of uterine fibroid   . Hypertension   . Liver fibrosis 09/03/2015   F2/3   . OA (osteoarthritis)    left knee, hands  . Pre-diabetes   . S/P dilatation of esophageal stricture   . Seasonal allergic rhinitis   . SOB (shortness of breath) on exertion   . Vitamin D deficiency     Past Surgical History:  Procedure Laterality Date  . ABDOMINAL HYSTERECTOMY  1994   w/ Unilateral Salpingoophorectomy   . APPENDECTOMY  age 32  . CARDIOVASCULAR STRESS TEST  03/17/2006   normal adenosine nuclear study , no exercise/ no evidence ischemia/  normal LV function and wall motion , ef 75%  . CHOLECYSTECTOMY N/A 02/04/2016   Procedure: LAPAROSCOPIC CHOLECYSTECTOMY ;  Surgeon: Armandina Gemma, MD;  Location: [facility];  Service: General;  Laterality: N/A;  . CHONDROPLASTY Left 11/09/2017   Procedure: CHONDROPLASTY;  Surgeon:  Sydnee Cabal, MD;  Location: [facility];  Service: Orthopedics;  Laterality: Left;  . COLONOSCOPY WITH ESOPHAGOGASTRODUODENOSCOPY (EGD)  last one 08-19-2015  dr Henrene Pastor  . DILATION AND CURETTAGE OF UTERUS  1973  . DOBUTAMINE STRESS ECHO  03-05-2010   dr Stanford Breed   normal stress echo w/ normal LVEF   . KNEE ARTHROSCOPY Left 12/2016  . KNEE ARTHROSCOPY WITH MEDIAL MENISECTOMY Left 11/09/2017   Procedure: LEFT KNEE ARTHROSCOPY, DEBRIDEMENT WITH PARTIAL MEDIAL AND  LATERAL MENISECTOMY, CHONDROPLASTY, ARTHROSCOPIC ASSISTED INTERNAL FIXATION MEDIAL TIBIAL PLATEAU FRACTURE;  Surgeon: Sydnee Cabal, MD;  Location: [facility];  Service: Orthopedics;  Laterality: Left;  . TRANSTHORACIC ECHOCARDIOGRAM  06/15/2017    dr Marlou Porch   mild LVH, ef 17-00%, grade 1 diastolic dysfunction/  mild LAE/  aneurysmal interatrial septum-- cannot exclude PFO/  trivial TR/ impressions: there is severe hypertrophy of the mid and apical segments highly suspicious for an apical form of hypertrophic cardiomyopathy    Social History Anna [name]  reports that she has never smoked. She has never used smokeless tobacco. She reports that she drinks alcohol. She reports that she does not use drugs.  family history includes Arthritis in her mother; Heart attack (age of onset: 64) in her sister; Hypertension in her brother, maternal grandmother, mother, sister, sister, and sister; Parkinsonism in her mother; Prostate cancer in her brother and father.  Allergies  Allergen Reactions  . Tramadol     SEVERE HEADACHES , dizzy, nausea       PHYSICAL EXAMINATION: Vital signs: BP 118/80   Pulse 72   Ht 5\' 2"  (1.575 m)   Wt 161 lb 6 oz (73.2 kg)   BMI 29.52 kg/m   Constitutional: generally well-appearing, no acute distress Psychiatric: alert and oriented x3, cooperative Eyes: extraocular movements intact, anicteric, conjunctiva pink Mouth: oral pharynx moist, no lesions Neck: supple no lymphadenopathy Cardiovascular: heart regular rate and rhythm, no murmur Lungs: clear to auscultation bilaterally Abdomen: soft, nontender, nondistended, no obvious ascites, no peritoneal signs, normal bowel sounds, no organomegaly Rectal: Omitted Extremities: no lower extremity edema bilaterally Skin: no lesions on visible extremities Neuro: No focal deficits. No asterixis.    ASSESSMENT:  1.  Change in bowel habits as manifested by urgency with loose stools and increased  foul-smelling flatus.  Suspect bacterial overgrowth. 2.  History of GERD with esophagitis and stricture.  Asymptomatic at present off medical therapy 3.  History of symptomatic cholelithiasis status post cholecystectomy 4.  Negative screening colonoscopy February 2017  PLAN:  1.  Xifaxan 550 mg 3 times daily for 2 weeks has been prescribed 2.  Yogurt 3.  Reflux precautions 4.  Recommend PPI for recurrent reflux symptoms or dysphasia 5.  Contact the office if symptoms persist despite the above measures 6.  Screening colonoscopy around 2027  25-minute spent face-to-face with the patient.  Greater than 50% the time is for counseling regarding her altered bowel habits and flatus

## 2018-06-01 NOTE — Patient Instructions (Signed)
We have sent the following medications to your pharmacy for you to pick up at your convenience: Xifaxan  

## 2018-06-07 ENCOUNTER — Ambulatory Visit (INDEPENDENT_AMBULATORY_CARE_PROVIDER_SITE_OTHER): Payer: BLUE CROSS/BLUE SHIELD | Admitting: Pharmacist

## 2018-06-07 VITALS — BP 148/88 | HR 63

## 2018-06-07 DIAGNOSIS — I10 Essential (primary) hypertension: Secondary | ICD-10-CM

## 2018-06-07 NOTE — Patient Instructions (Signed)
Thank you for coming to see [place] today!  CONTINUE losartan 100mg  daily, metoprolol succinate 50mg  daily, and amlodipine 10mg  daily.  Keep checking your blood pressure at home and keep note of it. Your blood pressure goal is <130/80 mmHg. If you see your blood pressure elevated for a couple of readings in a row, pleas give [place] a call.  If you have any questions or concerns, or if you wanted to schedule a follow up appointment, please call [phone].

## 2018-06-07 NOTE — Progress Notes (Signed)
Patient ID: Anna [name]                 DOB: [birth year]                      MRN: [id number]     HPI: Anna [name] is a 67 y.o. female patient of Dr. Marlou Porch who presents today for hypertension evaluation. PMH significant for hypertrophic cardiomyopathy. At last visit, BP was elevated at 152/78 and amlodipine was increased to 10mg  daily.   Patient arrives alone and in ok spirits. She states that she had just received some devastating news while in the lobby waiting to be called in for her appointment. Patient says that her blood pressure has been at goal while she is at home. She denies dizziness, chest pain or adverse effects.   Patient reports that her gastrologist noted some ankle swelling at her last appointment with him. She states that her ankles do swell up when she is sitting in the same position for a long time - she states that she had just driven from [place] before her appointment with the gastrologist. Besides this, patient denies any new edema or leg swelling.   Patient is checking her pressure at home - she brought her home cuff with her, but the batteries were dead and we were unable to compare her home cuff to a manual BP check. Patient reports that she is seeing 110s-120s/70s-80s when she is checking her BP at home.   Patient ask about hair loss with blood pressure medications. She states that both of her hair stylists have mentioned that her hair has gotten thinner and that they have clients in which hair thinning has occurred while on blood pressure medications. Patient asks about a "horsetail" supplement for hair thickening and if it would interact with any of her current medications. Performed an Statistician via the [employer] interaction checker to see if there were any notable interactions with her prescription medications and there were none.   Current HTN meds:  Amlodipine 10mg  daily in the morning Losartan 100mg  daily in the morning Metoprolol succinate  50mg  daily in the morning  Previously tried: none  BP goal: <130/18mmHg  Family History: Arthritis in her mother; Heart attack (age of onset: 29) in her sister; Hypertension in her brother, maternal grandmother, mother, sister, sister, and sister; Parkinsonism in her mother; Prostate cancer in her brother and father.  Social History: The patientreports that she has never smoked. She has never used smokeless tobacco. She reports that she drinks alcohol. She reports that she does not use drugs.  Diet: She eats from out and from home. She does eat at [employer], [employer], [employer]. She does not add salt to her food. She does eat vegetables regularly. She drinks mostly water with one cup of coffee per day.  Exercise: She does walk and weight exercises.   Home BP readings: reports 110s/120s-70s/80s  Wt Readings from Last 3 Encounters:  06/01/18 161 lb 6 oz (73.2 kg)  05/23/18 161 lb 12.8 oz (73.4 kg)  05/09/18 161 lb 6.4 oz (73.2 kg)   BP Readings from Last 3 Encounters:  06/07/18 (!) 148/88  06/01/18 118/80  05/23/18 122/70   Pulse Readings from Last 3 Encounters:  06/07/18 63  06/01/18 72  05/23/18 74    Renal function: CrCl cannot be calculated (Patient's most recent lab result is older than the maximum 21 days allowed.).  Past Medical History:  Diagnosis Date  .  Acute meniscal tear of left knee   . Apical variant hypertrophic cardiomyopathy ([place]) no episodes of syncope, no family hx sudden cardiac death, and no palpitations:  holter monitor 10-27-2017 showed PVC"s/ PAC's   cardiologist-  dr Marlou Porch--- per lov note, dated 10-17-2017-- Cardiac CT (08-24-2017) calcium 0, normal coronary orgin with right dominance, no evidence CAD, there is severe LVH predominantly in the apical segments that are obliterating LV cavity, no apical aneurysm is seen:  symptomatic with chest tightness and sob with activity/  . Atypical chest pain    Negative nuclear stress test , done 03-17-2006,   referr to cardiologist notes by dr Marlou Porch in epic  . Chronic cholecystitis with calculus 02/02/2016  . Dyspnea 02/08/2013   Evaluated by Dr Gwenette Greet  Pulmonary function test normal except for mild decrease in diffusing capacity  Normal ventilation/perfusion scan  Resolution without treatment.   Marland Kitchen Dysrhythmia   . Essential hypertension 04/05/2007   Qualifier: Diagnosis of  By: Linna Darner MD, Gwyndolyn Saxon    . Family history of adverse reaction to anesthesia    pts sister - severe PONV  . Floaters in visual field, bilateral   . Hemorrhoids, internal    and external  . Hiatal hernia   . History of hepatitis C    dx 01/ 2017--  followed by infectious disease, dr comer,  Genotype 1b, F2/3 per elastography-- pt completed harvoni treatment 05/ 2017, now cured;  last [place] abd/ elastography 08-24-2016 F0/1  . History of uterine fibroid   . Hypertension   . Liver fibrosis 09/03/2015   F2/3   . OA (osteoarthritis)    left knee, hands  . Pre-diabetes   . S/P dilatation of esophageal stricture   . Seasonal allergic rhinitis   . SOB (shortness of breath) on exertion   . Vitamin D deficiency     Current Outpatient Medications on File Prior to Visit  Medication Sig Dispense Refill  . amLODipine (NORVASC) 10 MG tablet Take 1 tablet (10 mg total) by mouth daily.    Marland Kitchen losartan (COZAAR) 100 MG tablet Take 1 tablet (100 mg total) by mouth daily. 90 tablet 3  . metoprolol succinate (TOPROL-XL) 50 MG 24 hr tablet Take 1 tablet (50 mg total) by mouth daily. 90 tablet 3  . rifaximin (XIFAXAN) 550 MG TABS tablet Take 1 tablet (550 mg total) by mouth 3 (three) times daily. 42 tablet 0  . Vitamin D, Ergocalciferol, (DRISDOL) 1.25 MG (50000 UT) CAPS capsule Take 1 capsule (50,000 Units total) by mouth every 7 (seven) days. 12 capsule 0   No current facility-administered medications on file prior to visit.     Allergies  Allergen Reactions  . Tramadol     SEVERE HEADACHES , dizzy, nausea    Blood pressure (!) 148/88,  pulse 63, SpO2 98 %.   Assessment/Plan:  1. Hypertension: Blood pressure above goal of <130/80 in clinic today. Patient has been at goal at home and at her last three office visits with other practitioners in the last month since her amlodipine has been increased. Given that patient immediately stated at the start of the visit that she had just received devastating news and that her blood pressure decreased on a recheck, will not adjust medications today. Continue losartan 100mg  daily, amlodipine 10mg  daily, and metoprolol succinate 50mg  daily. Counseled patient that there are no interactions with the horsetail supplement and her current medications so if she wishes to start the supplement she can. Follow up with cardiologist as scheduled  and with hypertension clinic as needed.   Cleotis Lema, PharmD Student 06/07/18 4:46 PM

## 2018-06-08 ENCOUNTER — Ambulatory Visit: Payer: BLUE CROSS/BLUE SHIELD

## 2018-06-13 MED FILL — VIT D2 1.25 MG (50,000 UNIT: 1.25 MG | 28 days supply | Qty: 4 | Fill #1

## 2018-06-26 ENCOUNTER — Other Ambulatory Visit: Payer: Self-pay | Admitting: Family Medicine

## 2018-06-26 DIAGNOSIS — I10 Essential (primary) hypertension: Secondary | ICD-10-CM

## 2018-08-25 IMAGING — US US ABDOMEN COMPLETE W/ ELASTOGRAPHY
1 series · 13 of 25 positions shown · non-contrast
Comparison: 05/13/2016

CLINICAL DATA: Hepatitis c.  Liver fibrosis.



[Series 1: us abdomen complete w/ elastography · 0.20mm/px · 13 of 69 slices shown]
[im 1/69]
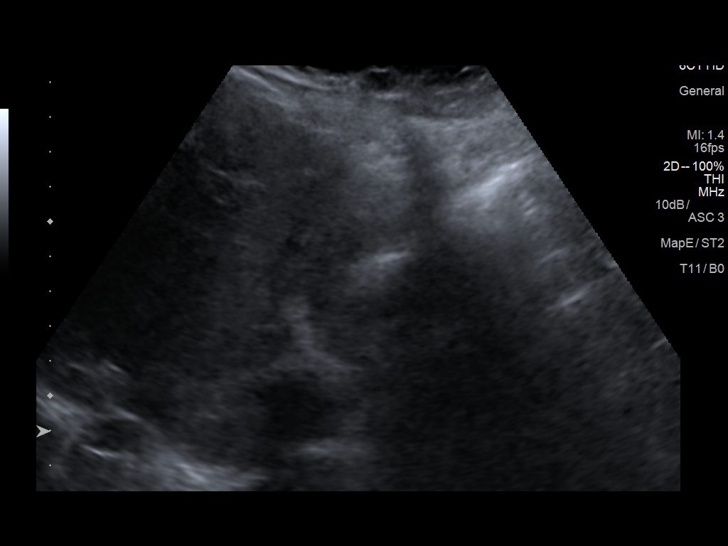
[im 6/69]
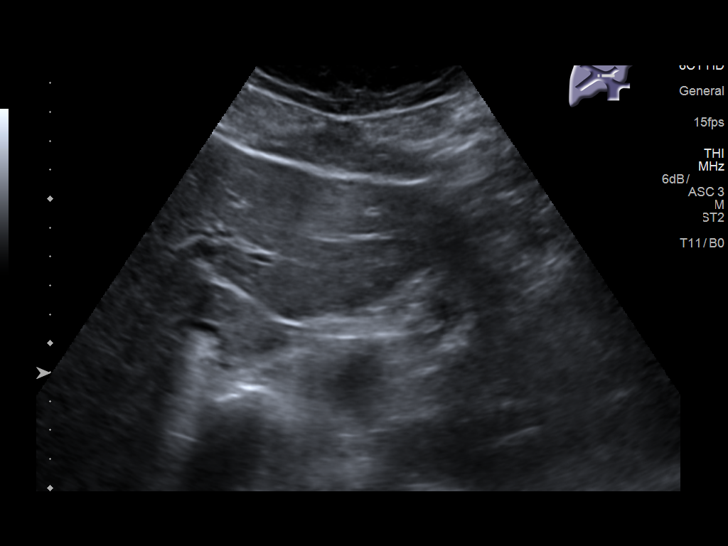
[im 12/69]
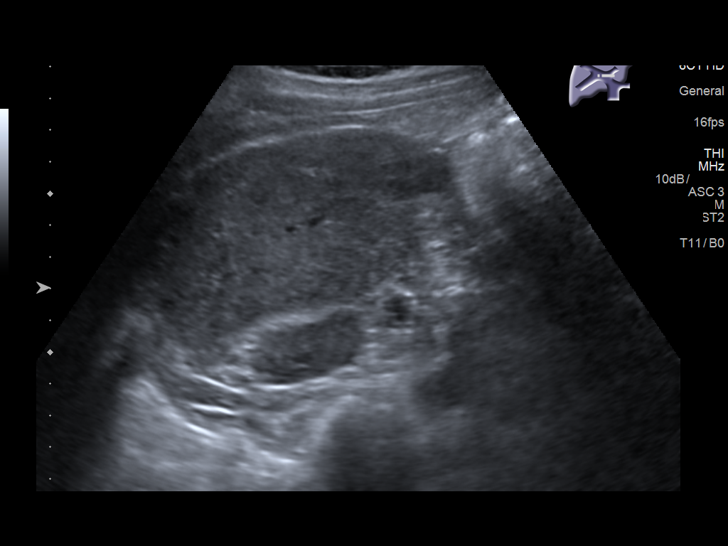
[im 18/69]
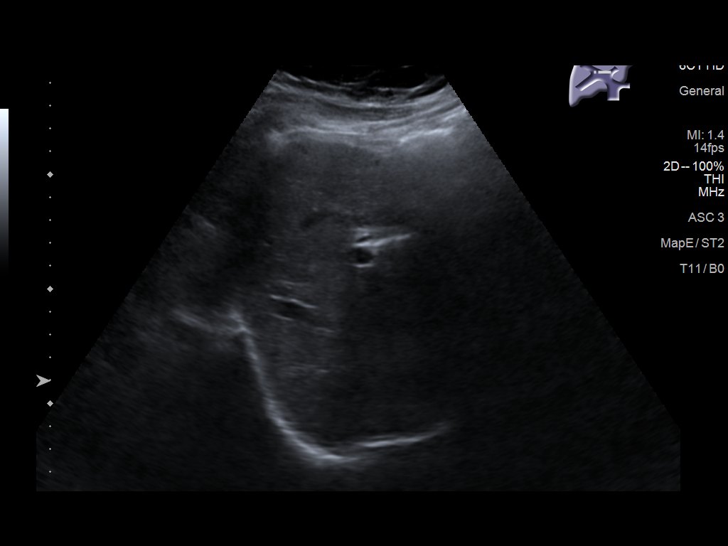
[im 23/69]
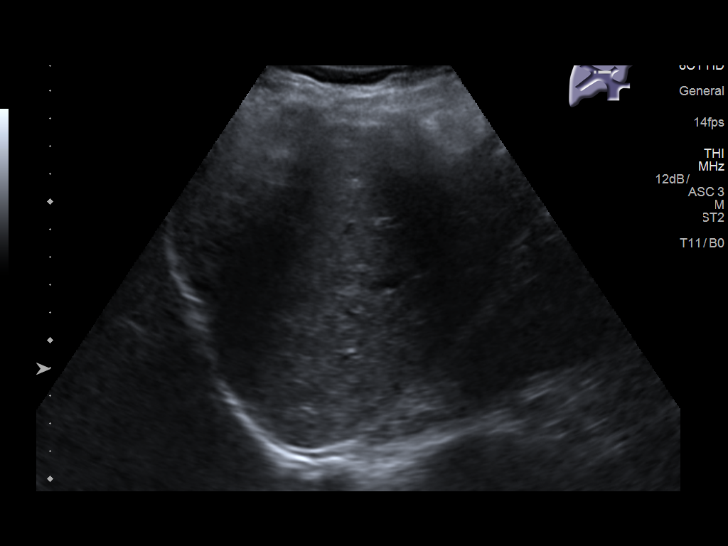
[im 29/69]
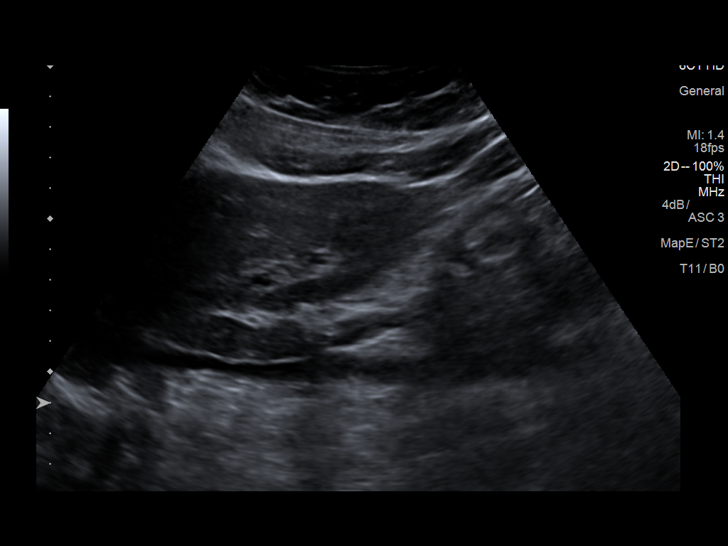
[im 35/69]
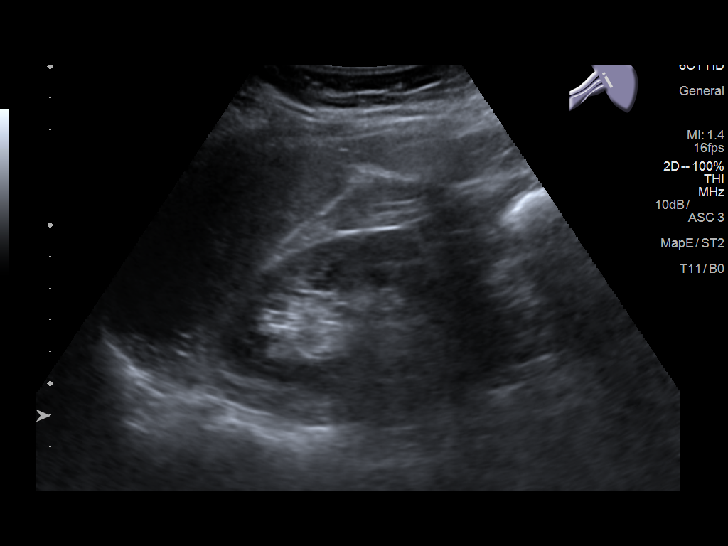
[im 40/69]
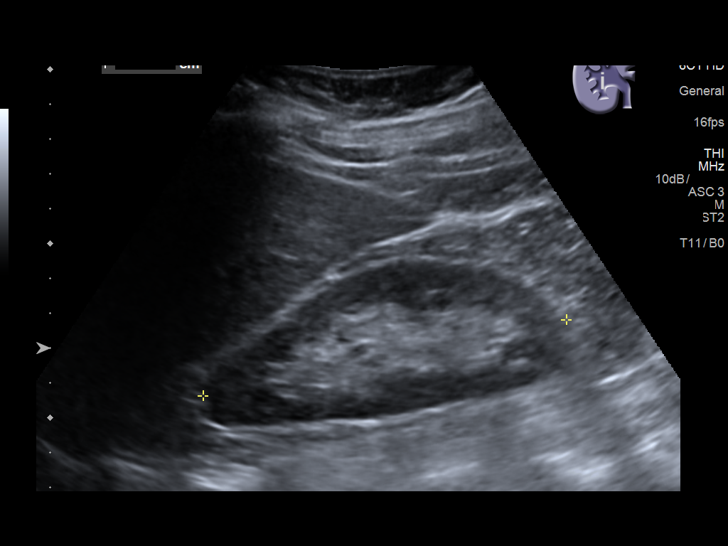
[im 46/69]
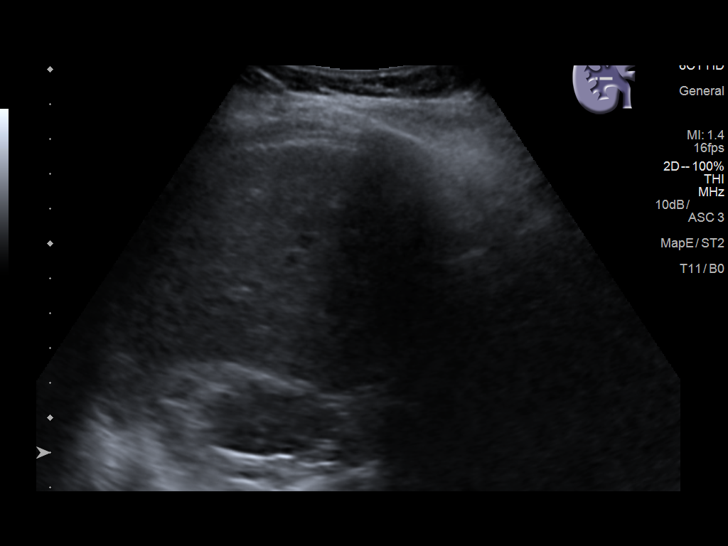
[im 52/69]
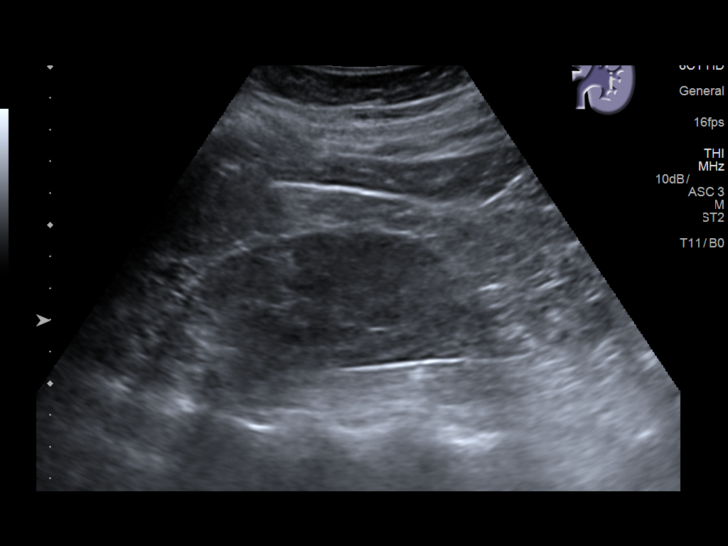
[im 57/69]
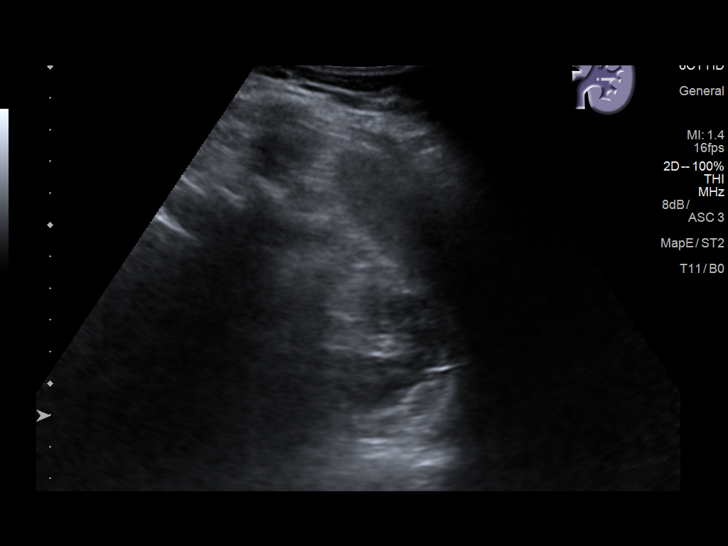
[im 63/69]
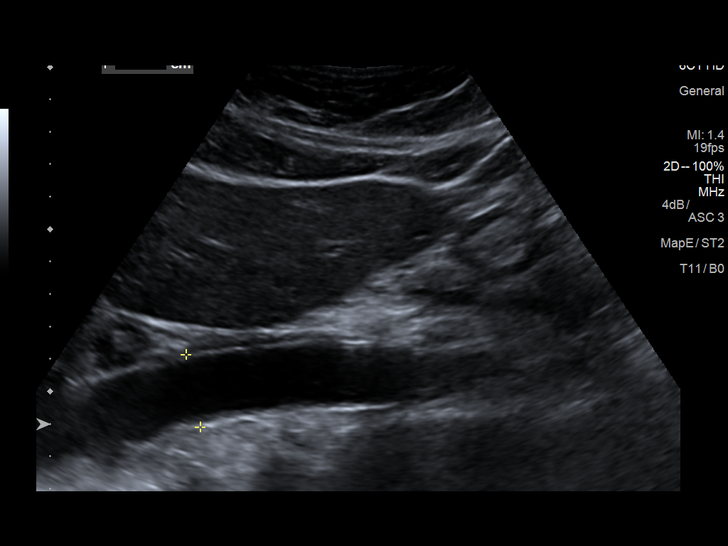
[im 69/69]
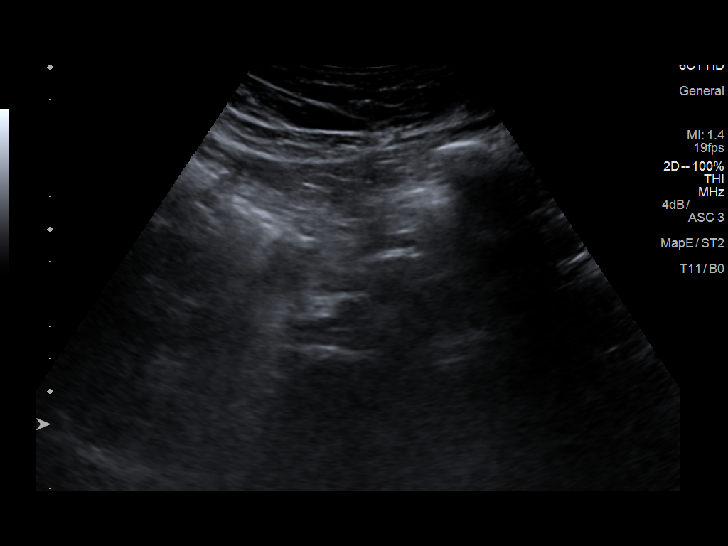

[13 of 25 positions shown; findings below may reference images not displayed]

FINDINGS: ULTRASOUND ABDOMEN

Gallbladder: Surgically absent

Common bile duct: Diameter: 4 mm

Liver: Heterogeneous echotexture, no focal mass seen.

IVC: No abnormality visualized.

Pancreas: Visualized portion unremarkable. Pancreatic head not well
seen due to overlying bowel gas.

Spleen: Size and appearance within normal limits.

Right Kidney: Length: 10.6 cm. Echogenicity within normal limits. No
mass or hydronephrosis visualized.

Left Kidney: Length: 11.1 cm. Echogenicity within normal limits. No
mass or hydronephrosis visualized.

Abdominal aorta: No aneurysm visualized.

Other findings: None.

ULTRASOUND HEPATIC ELASTOGRAPHY

Device: Siemens Helix VTQ

Patient position: Supine

Transducer 6C1

Number of measurements: 10

Hepatic segment:  8

Median velocity:   1.09  m/sec

IQR:

IQR/Median velocity ratio:

Corresponding Metavir fibrosis score:  F0/F1

Risk of fibrosis: Minimal

Limitations of exam: None

Pertinent findings noted on other imaging exams:  None

Please note that abnormal shear wave velocities may also be
identified in clinical settings other than with hepatic fibrosis,
such as: acute hepatitis, elevated right heart and central venous
pressures including use of beta blockers, Arif Ullah disease
([place]), infiltrative processes such as
mastocytosis/amyloidosis/infiltrative tumor, extrahepatic
cholestasis, in the post-prandial state, and liver transplantation.
Correlation with patient history, laboratory data, and clinical
condition recommended.
IMPRESSION: ULTRASOUND ABDOMEN:

1. Mildly heterogeneous hepatic echotexture but without mass
identified. No definite marginal nodularity to further suggest
cirrhosis.

ULTRASOUND HEPATIC ELASTOGRAPHY:

Median hepatic shear wave velocity is calculated at 1.09 m/sec.

Corresponding Metavir fibrosis score is  F0/F1 .

Risk of fibrosis is minimal.

Follow-up: None required

## 2018-09-18 ENCOUNTER — Telehealth: Payer: Self-pay | Admitting: Cardiology

## 2018-09-18 NOTE — Telephone Encounter (Signed)
New message   Patient calling, would like to know if she needs to continue with the hypertension clinic treatment. Patient has moved to [facility]. Please call

## 2018-09-18 NOTE — Telephone Encounter (Signed)
Spoke with patient and advised that she get a primary care physician in [place] to follow BP and address any concerns if needed. We discussed that from a blood pressure perspective she did not need follow up since per her last visit with HTN clinic her pressures were controlled. She states understanding and will continue to follow her pressures and notify her new primary care physician with any changes (she has an appt later this week).

## 2018-11-15 ENCOUNTER — Telehealth: Payer: Self-pay | Admitting: Cardiology

## 2018-11-15 NOTE — Telephone Encounter (Signed)
Called patient to schedule 6 month follow up. Patient said that she now lives in [place] and that was advised by Creta Levin to continue to keep track of her BP and to continue seeing her primary care provider there in [place].  Patient will be referred to a cardiologist by her primary care if needed.

## 2018-11-19 NOTE — Telephone Encounter (Signed)
Noted,  Thank you!
